# Patient Record
Sex: Female | Born: 1945 | Race: White | Hispanic: No | Marital: Single | Smoking: Former smoker
Health system: Southern US, Community
[De-identification: ages and names within clinical notes are randomized; demographics above are authoritative.]

## PROBLEM LIST (undated history)

## (undated) DIAGNOSIS — Z96653 Presence of artificial knee joint, bilateral: Secondary | ICD-10-CM

## (undated) DIAGNOSIS — I890 Lymphedema, not elsewhere classified: Secondary | ICD-10-CM

## (undated) DIAGNOSIS — I350 Nonrheumatic aortic (valve) stenosis: Secondary | ICD-10-CM

## (undated) DIAGNOSIS — C569 Malignant neoplasm of unspecified ovary: Secondary | ICD-10-CM

## (undated) DIAGNOSIS — Z972 Presence of dental prosthetic device (complete) (partial): Secondary | ICD-10-CM

## (undated) DIAGNOSIS — R918 Other nonspecific abnormal finding of lung field: Secondary | ICD-10-CM

## (undated) DIAGNOSIS — I480 Paroxysmal atrial fibrillation: Secondary | ICD-10-CM

## (undated) DIAGNOSIS — K08109 Complete loss of teeth, unspecified cause, unspecified class: Secondary | ICD-10-CM

## (undated) DIAGNOSIS — R32 Unspecified urinary incontinence: Secondary | ICD-10-CM

## (undated) DIAGNOSIS — Z9989 Dependence on other enabling machines and devices: Secondary | ICD-10-CM

## (undated) DIAGNOSIS — J45909 Unspecified asthma, uncomplicated: Secondary | ICD-10-CM

## (undated) DIAGNOSIS — E119 Type 2 diabetes mellitus without complications: Secondary | ICD-10-CM

## (undated) DIAGNOSIS — G629 Polyneuropathy, unspecified: Secondary | ICD-10-CM

## (undated) DIAGNOSIS — Z952 Presence of prosthetic heart valve: Secondary | ICD-10-CM

## (undated) DIAGNOSIS — Z8739 Personal history of other diseases of the musculoskeletal system and connective tissue: Secondary | ICD-10-CM

## (undated) DIAGNOSIS — Z96619 Presence of unspecified artificial shoulder joint: Secondary | ICD-10-CM

## (undated) DIAGNOSIS — I82409 Acute embolism and thrombosis of unspecified deep veins of unspecified lower extremity: Secondary | ICD-10-CM

## (undated) DIAGNOSIS — C50919 Malignant neoplasm of unspecified site of unspecified female breast: Secondary | ICD-10-CM

## (undated) DIAGNOSIS — G4733 Obstructive sleep apnea (adult) (pediatric): Secondary | ICD-10-CM

## (undated) DIAGNOSIS — K219 Gastro-esophageal reflux disease without esophagitis: Secondary | ICD-10-CM

## (undated) DIAGNOSIS — M109 Gout, unspecified: Secondary | ICD-10-CM

## (undated) DIAGNOSIS — I10 Essential (primary) hypertension: Secondary | ICD-10-CM

---

## 1991-03-11 HISTORY — PX: MASTECTOMY: SHX3

## 1997-08-11 ENCOUNTER — Ambulatory Visit (HOSPITAL_COMMUNITY): Admission: RE | Admit: 1997-08-11 | Discharge: 1997-08-11 | Payer: Self-pay | Admitting: Oncology

## 1997-12-24 ENCOUNTER — Emergency Department (HOSPITAL_COMMUNITY): Admission: EM | Admit: 1997-12-24 | Discharge: 1997-12-24 | Payer: Self-pay | Admitting: Emergency Medicine

## 1998-01-07 ENCOUNTER — Emergency Department (HOSPITAL_COMMUNITY): Admission: EM | Admit: 1998-01-07 | Discharge: 1998-01-07 | Payer: Self-pay | Admitting: Emergency Medicine

## 1998-01-08 ENCOUNTER — Ambulatory Visit (HOSPITAL_COMMUNITY): Admission: RE | Admit: 1998-01-08 | Discharge: 1998-01-08 | Payer: Self-pay

## 1998-01-11 ENCOUNTER — Ambulatory Visit (HOSPITAL_COMMUNITY): Admission: RE | Admit: 1998-01-11 | Discharge: 1998-01-11 | Payer: Self-pay | Admitting: Internal Medicine

## 1998-01-11 ENCOUNTER — Encounter: Payer: Self-pay | Admitting: Internal Medicine

## 1998-05-04 ENCOUNTER — Encounter: Payer: Self-pay | Admitting: Oncology

## 1998-05-04 ENCOUNTER — Ambulatory Visit (HOSPITAL_COMMUNITY): Admission: RE | Admit: 1998-05-04 | Discharge: 1998-05-04 | Payer: Self-pay | Admitting: Oncology

## 1998-09-25 ENCOUNTER — Ambulatory Visit (HOSPITAL_COMMUNITY): Admission: RE | Admit: 1998-09-25 | Discharge: 1998-09-25 | Payer: Self-pay | Admitting: Oncology

## 1998-09-25 ENCOUNTER — Encounter: Payer: Self-pay | Admitting: Oncology

## 1998-10-10 ENCOUNTER — Ambulatory Visit (HOSPITAL_COMMUNITY): Admission: RE | Admit: 1998-10-10 | Discharge: 1998-10-10 | Payer: Self-pay | Admitting: Oncology

## 1998-10-10 ENCOUNTER — Encounter: Payer: Self-pay | Admitting: Oncology

## 1998-10-16 ENCOUNTER — Encounter: Payer: Self-pay | Admitting: Oncology

## 1998-10-16 ENCOUNTER — Ambulatory Visit (HOSPITAL_COMMUNITY): Admission: RE | Admit: 1998-10-16 | Discharge: 1998-10-16 | Payer: Self-pay | Admitting: Oncology

## 1999-04-01 ENCOUNTER — Ambulatory Visit (HOSPITAL_COMMUNITY): Admission: RE | Admit: 1999-04-01 | Discharge: 1999-04-01 | Payer: Self-pay | Admitting: Oncology

## 1999-04-01 ENCOUNTER — Encounter: Payer: Self-pay | Admitting: Oncology

## 1999-05-07 ENCOUNTER — Encounter: Payer: Self-pay | Admitting: Oncology

## 1999-05-07 ENCOUNTER — Ambulatory Visit (HOSPITAL_COMMUNITY): Admission: RE | Admit: 1999-05-07 | Discharge: 1999-05-07 | Payer: Self-pay | Admitting: Oncology

## 2000-05-13 ENCOUNTER — Encounter: Payer: Self-pay | Admitting: Internal Medicine

## 2000-05-13 ENCOUNTER — Ambulatory Visit (HOSPITAL_COMMUNITY): Admission: RE | Admit: 2000-05-13 | Discharge: 2000-05-13 | Payer: Self-pay | Admitting: Internal Medicine

## 2000-08-12 ENCOUNTER — Emergency Department (HOSPITAL_COMMUNITY): Admission: EM | Admit: 2000-08-12 | Discharge: 2000-08-12 | Payer: Self-pay | Admitting: *Deleted

## 2001-03-18 ENCOUNTER — Encounter: Payer: Self-pay | Admitting: Emergency Medicine

## 2001-03-18 ENCOUNTER — Emergency Department (HOSPITAL_COMMUNITY): Admission: EM | Admit: 2001-03-18 | Discharge: 2001-03-18 | Payer: Self-pay | Admitting: Emergency Medicine

## 2001-05-26 ENCOUNTER — Ambulatory Visit (HOSPITAL_COMMUNITY): Admission: RE | Admit: 2001-05-26 | Discharge: 2001-05-26 | Payer: Self-pay | Admitting: Oncology

## 2001-05-26 ENCOUNTER — Encounter: Payer: Self-pay | Admitting: Oncology

## 2002-11-18 ENCOUNTER — Ambulatory Visit (HOSPITAL_COMMUNITY): Admission: RE | Admit: 2002-11-18 | Discharge: 2002-11-18 | Payer: Self-pay | Admitting: Internal Medicine

## 2002-11-18 ENCOUNTER — Encounter: Payer: Self-pay | Admitting: Internal Medicine

## 2003-04-27 ENCOUNTER — Emergency Department (HOSPITAL_COMMUNITY): Admission: EM | Admit: 2003-04-27 | Discharge: 2003-04-28 | Payer: Self-pay | Admitting: Emergency Medicine

## 2015-04-12 ENCOUNTER — Encounter (HOSPITAL_BASED_OUTPATIENT_CLINIC_OR_DEPARTMENT_OTHER): Payer: Medicare Other | Attending: Internal Medicine

## 2015-04-12 DIAGNOSIS — Z96619 Presence of unspecified artificial shoulder joint: Secondary | ICD-10-CM | POA: Diagnosis not present

## 2015-04-12 DIAGNOSIS — J45909 Unspecified asthma, uncomplicated: Secondary | ICD-10-CM | POA: Diagnosis not present

## 2015-04-12 DIAGNOSIS — S51802A Unspecified open wound of left forearm, initial encounter: Secondary | ICD-10-CM | POA: Insufficient documentation

## 2015-04-12 DIAGNOSIS — T451X5A Adverse effect of antineoplastic and immunosuppressive drugs, initial encounter: Secondary | ICD-10-CM | POA: Diagnosis not present

## 2015-04-12 DIAGNOSIS — Z87891 Personal history of nicotine dependence: Secondary | ICD-10-CM | POA: Diagnosis not present

## 2015-04-12 DIAGNOSIS — E119 Type 2 diabetes mellitus without complications: Secondary | ICD-10-CM | POA: Diagnosis not present

## 2015-04-12 DIAGNOSIS — I10 Essential (primary) hypertension: Secondary | ICD-10-CM | POA: Diagnosis not present

## 2015-04-12 DIAGNOSIS — G4730 Sleep apnea, unspecified: Secondary | ICD-10-CM | POA: Insufficient documentation

## 2015-04-12 DIAGNOSIS — Z9221 Personal history of antineoplastic chemotherapy: Secondary | ICD-10-CM | POA: Insufficient documentation

## 2015-04-12 DIAGNOSIS — Z96653 Presence of artificial knee joint, bilateral: Secondary | ICD-10-CM | POA: Diagnosis not present

## 2015-04-12 DIAGNOSIS — J449 Chronic obstructive pulmonary disease, unspecified: Secondary | ICD-10-CM | POA: Diagnosis not present

## 2015-04-12 DIAGNOSIS — I972 Postmastectomy lymphedema syndrome: Secondary | ICD-10-CM | POA: Insufficient documentation

## 2015-04-12 DIAGNOSIS — G620 Drug-induced polyneuropathy: Secondary | ICD-10-CM | POA: Diagnosis not present

## 2015-04-12 DIAGNOSIS — Y838 Other surgical procedures as the cause of abnormal reaction of the patient, or of later complication, without mention of misadventure at the time of the procedure: Secondary | ICD-10-CM | POA: Insufficient documentation

## 2015-04-20 DIAGNOSIS — S51802A Unspecified open wound of left forearm, initial encounter: Secondary | ICD-10-CM | POA: Diagnosis not present

## 2015-04-27 DIAGNOSIS — S51802A Unspecified open wound of left forearm, initial encounter: Secondary | ICD-10-CM | POA: Diagnosis not present

## 2018-06-22 ENCOUNTER — Encounter (HOSPITAL_COMMUNITY): Payer: Self-pay | Admitting: Physician Assistant

## 2018-06-22 ENCOUNTER — Emergency Department (HOSPITAL_COMMUNITY): Payer: Medicare Other

## 2018-06-22 ENCOUNTER — Inpatient Hospital Stay (HOSPITAL_COMMUNITY)
Admission: EM | Admit: 2018-06-22 | Discharge: 2018-06-30 | DRG: 266 | Disposition: A | Discharge status: Home / Self Care | Payer: Medicare Other | Attending: Cardiovascular Disease | Admitting: Cardiovascular Disease

## 2018-06-22 ENCOUNTER — Other Ambulatory Visit: Payer: Self-pay

## 2018-06-22 ENCOUNTER — Inpatient Hospital Stay (HOSPITAL_COMMUNITY): Payer: Medicare Other

## 2018-06-22 DIAGNOSIS — Z9981 Dependence on supplemental oxygen: Secondary | ICD-10-CM | POA: Diagnosis not present

## 2018-06-22 DIAGNOSIS — I5041 Acute combined systolic (congestive) and diastolic (congestive) heart failure: Secondary | ICD-10-CM | POA: Diagnosis not present

## 2018-06-22 DIAGNOSIS — Z9989 Dependence on other enabling machines and devices: Secondary | ICD-10-CM | POA: Diagnosis not present

## 2018-06-22 DIAGNOSIS — I10 Essential (primary) hypertension: Secondary | ICD-10-CM | POA: Diagnosis present

## 2018-06-22 DIAGNOSIS — E119 Type 2 diabetes mellitus without complications: Secondary | ICD-10-CM | POA: Diagnosis not present

## 2018-06-22 DIAGNOSIS — I272 Pulmonary hypertension, unspecified: Secondary | ICD-10-CM | POA: Diagnosis present

## 2018-06-22 DIAGNOSIS — R0602 Shortness of breath: Secondary | ICD-10-CM

## 2018-06-22 DIAGNOSIS — I080 Rheumatic disorders of both mitral and aortic valves: Principal | ICD-10-CM | POA: Diagnosis present

## 2018-06-22 DIAGNOSIS — Z90710 Acquired absence of both cervix and uterus: Secondary | ICD-10-CM

## 2018-06-22 DIAGNOSIS — E785 Hyperlipidemia, unspecified: Secondary | ICD-10-CM | POA: Diagnosis present

## 2018-06-22 DIAGNOSIS — A419 Sepsis, unspecified organism: Secondary | ICD-10-CM | POA: Diagnosis not present

## 2018-06-22 DIAGNOSIS — I5043 Acute on chronic combined systolic (congestive) and diastolic (congestive) heart failure: Secondary | ICD-10-CM | POA: Diagnosis present

## 2018-06-22 DIAGNOSIS — D729 Disorder of white blood cells, unspecified: Secondary | ICD-10-CM

## 2018-06-22 DIAGNOSIS — I890 Lymphedema, not elsewhere classified: Secondary | ICD-10-CM

## 2018-06-22 DIAGNOSIS — Z6841 Body Mass Index (BMI) 40.0 and over, adult: Secondary | ICD-10-CM

## 2018-06-22 DIAGNOSIS — R9389 Abnormal findings on diagnostic imaging of other specified body structures: Secondary | ICD-10-CM | POA: Diagnosis present

## 2018-06-22 DIAGNOSIS — E1142 Type 2 diabetes mellitus with diabetic polyneuropathy: Secondary | ICD-10-CM | POA: Diagnosis present

## 2018-06-22 DIAGNOSIS — J9622 Acute and chronic respiratory failure with hypercapnia: Secondary | ICD-10-CM | POA: Diagnosis present

## 2018-06-22 DIAGNOSIS — J9601 Acute respiratory failure with hypoxia: Secondary | ICD-10-CM | POA: Diagnosis not present

## 2018-06-22 DIAGNOSIS — Z96653 Presence of artificial knee joint, bilateral: Secondary | ICD-10-CM | POA: Diagnosis present

## 2018-06-22 DIAGNOSIS — I2721 Secondary pulmonary arterial hypertension: Secondary | ICD-10-CM | POA: Diagnosis present

## 2018-06-22 DIAGNOSIS — E1110 Type 2 diabetes mellitus with ketoacidosis without coma: Secondary | ICD-10-CM | POA: Diagnosis present

## 2018-06-22 DIAGNOSIS — I110 Hypertensive heart disease with heart failure: Secondary | ICD-10-CM | POA: Diagnosis present

## 2018-06-22 DIAGNOSIS — R32 Unspecified urinary incontinence: Secondary | ICD-10-CM | POA: Diagnosis present

## 2018-06-22 DIAGNOSIS — I350 Nonrheumatic aortic (valve) stenosis: Secondary | ICD-10-CM

## 2018-06-22 DIAGNOSIS — I2489 Other forms of acute ischemic heart disease: Secondary | ICD-10-CM | POA: Diagnosis present

## 2018-06-22 DIAGNOSIS — Z87891 Personal history of nicotine dependence: Secondary | ICD-10-CM

## 2018-06-22 DIAGNOSIS — D72828 Other elevated white blood cell count: Secondary | ICD-10-CM

## 2018-06-22 DIAGNOSIS — Z887 Allergy status to serum and vaccine status: Secondary | ICD-10-CM

## 2018-06-22 DIAGNOSIS — J9602 Acute respiratory failure with hypercapnia: Secondary | ICD-10-CM | POA: Diagnosis present

## 2018-06-22 DIAGNOSIS — R079 Chest pain, unspecified: Secondary | ICD-10-CM | POA: Diagnosis not present

## 2018-06-22 DIAGNOSIS — E876 Hypokalemia: Secondary | ICD-10-CM | POA: Diagnosis present

## 2018-06-22 DIAGNOSIS — J9621 Acute and chronic respiratory failure with hypoxia: Secondary | ICD-10-CM | POA: Diagnosis present

## 2018-06-22 DIAGNOSIS — I471 Supraventricular tachycardia: Secondary | ICD-10-CM | POA: Diagnosis not present

## 2018-06-22 DIAGNOSIS — Z8249 Family history of ischemic heart disease and other diseases of the circulatory system: Secondary | ICD-10-CM

## 2018-06-22 DIAGNOSIS — Z952 Presence of prosthetic heart valve: Secondary | ICD-10-CM | POA: Diagnosis not present

## 2018-06-22 DIAGNOSIS — J9600 Acute respiratory failure, unspecified whether with hypoxia or hypercapnia: Secondary | ICD-10-CM

## 2018-06-22 DIAGNOSIS — R69 Illness, unspecified: Secondary | ICD-10-CM

## 2018-06-22 DIAGNOSIS — Z006 Encounter for examination for normal comparison and control in clinical research program: Secondary | ICD-10-CM | POA: Diagnosis not present

## 2018-06-22 DIAGNOSIS — I480 Paroxysmal atrial fibrillation: Secondary | ICD-10-CM | POA: Diagnosis present

## 2018-06-22 DIAGNOSIS — I248 Other forms of acute ischemic heart disease: Secondary | ICD-10-CM | POA: Diagnosis present

## 2018-06-22 DIAGNOSIS — I351 Nonrheumatic aortic (valve) insufficiency: Secondary | ICD-10-CM | POA: Diagnosis not present

## 2018-06-22 DIAGNOSIS — I050 Rheumatic mitral stenosis: Secondary | ICD-10-CM | POA: Diagnosis not present

## 2018-06-22 DIAGNOSIS — E874 Mixed disorder of acid-base balance: Secondary | ICD-10-CM | POA: Diagnosis present

## 2018-06-22 DIAGNOSIS — J810 Acute pulmonary edema: Secondary | ICD-10-CM

## 2018-06-22 DIAGNOSIS — I5021 Acute systolic (congestive) heart failure: Secondary | ICD-10-CM | POA: Diagnosis not present

## 2018-06-22 DIAGNOSIS — Z8543 Personal history of malignant neoplasm of ovary: Secondary | ICD-10-CM

## 2018-06-22 DIAGNOSIS — R7989 Other specified abnormal findings of blood chemistry: Secondary | ICD-10-CM | POA: Diagnosis present

## 2018-06-22 DIAGNOSIS — I447 Left bundle-branch block, unspecified: Secondary | ICD-10-CM | POA: Diagnosis present

## 2018-06-22 DIAGNOSIS — Z881 Allergy status to other antibiotic agents status: Secondary | ICD-10-CM

## 2018-06-22 DIAGNOSIS — Z9119 Patient's noncompliance with other medical treatment and regimen: Secondary | ICD-10-CM

## 2018-06-22 DIAGNOSIS — J45909 Unspecified asthma, uncomplicated: Secondary | ICD-10-CM | POA: Diagnosis present

## 2018-06-22 DIAGNOSIS — G4733 Obstructive sleep apnea (adult) (pediatric): Secondary | ICD-10-CM

## 2018-06-22 DIAGNOSIS — Z9221 Personal history of antineoplastic chemotherapy: Secondary | ICD-10-CM

## 2018-06-22 DIAGNOSIS — R918 Other nonspecific abnormal finding of lung field: Secondary | ICD-10-CM | POA: Diagnosis present

## 2018-06-22 DIAGNOSIS — M109 Gout, unspecified: Secondary | ICD-10-CM | POA: Diagnosis present

## 2018-06-22 DIAGNOSIS — Z9012 Acquired absence of left breast and nipple: Secondary | ICD-10-CM

## 2018-06-22 DIAGNOSIS — I4891 Unspecified atrial fibrillation: Secondary | ICD-10-CM | POA: Diagnosis not present

## 2018-06-22 DIAGNOSIS — Z96612 Presence of left artificial shoulder joint: Secondary | ICD-10-CM | POA: Diagnosis present

## 2018-06-22 DIAGNOSIS — J189 Pneumonia, unspecified organism: Secondary | ICD-10-CM | POA: Diagnosis present

## 2018-06-22 DIAGNOSIS — I361 Nonrheumatic tricuspid (valve) insufficiency: Secondary | ICD-10-CM | POA: Diagnosis not present

## 2018-06-22 DIAGNOSIS — Z20828 Contact with and (suspected) exposure to other viral communicable diseases: Secondary | ICD-10-CM | POA: Diagnosis present

## 2018-06-22 DIAGNOSIS — I5033 Acute on chronic diastolic (congestive) heart failure: Secondary | ICD-10-CM | POA: Diagnosis present

## 2018-06-22 DIAGNOSIS — Z853 Personal history of malignant neoplasm of breast: Secondary | ICD-10-CM

## 2018-06-22 DIAGNOSIS — G629 Polyneuropathy, unspecified: Secondary | ICD-10-CM | POA: Diagnosis not present

## 2018-06-22 DIAGNOSIS — Z7901 Long term (current) use of anticoagulants: Secondary | ICD-10-CM

## 2018-06-22 DIAGNOSIS — E1165 Type 2 diabetes mellitus with hyperglycemia: Secondary | ICD-10-CM | POA: Diagnosis not present

## 2018-06-22 DIAGNOSIS — E118 Type 2 diabetes mellitus with unspecified complications: Secondary | ICD-10-CM | POA: Diagnosis not present

## 2018-06-22 DIAGNOSIS — J811 Chronic pulmonary edema: Secondary | ICD-10-CM | POA: Diagnosis present

## 2018-06-22 DIAGNOSIS — K219 Gastro-esophageal reflux disease without esophagitis: Secondary | ICD-10-CM | POA: Diagnosis present

## 2018-06-22 DIAGNOSIS — J440 Chronic obstructive pulmonary disease with acute lower respiratory infection: Secondary | ICD-10-CM | POA: Diagnosis present

## 2018-06-22 DIAGNOSIS — E1159 Type 2 diabetes mellitus with other circulatory complications: Secondary | ICD-10-CM | POA: Diagnosis not present

## 2018-06-22 HISTORY — DX: Malignant neoplasm of unspecified ovary: C56.9

## 2018-06-22 HISTORY — DX: Lymphedema, not elsewhere classified: I89.0

## 2018-06-22 HISTORY — DX: Acute embolism and thrombosis of unspecified deep veins of unspecified lower extremity: I82.409

## 2018-06-22 HISTORY — DX: Nonrheumatic aortic (valve) stenosis: I35.0

## 2018-06-22 HISTORY — DX: Complete loss of teeth, unspecified cause, unspecified class: K08.109

## 2018-06-22 HISTORY — DX: Morbid (severe) obesity due to excess calories: E66.01

## 2018-06-22 HISTORY — DX: Polyneuropathy, unspecified: G62.9

## 2018-06-22 HISTORY — DX: Obstructive sleep apnea (adult) (pediatric): Z99.89

## 2018-06-22 HISTORY — DX: Essential (primary) hypertension: I10

## 2018-06-22 HISTORY — DX: Gout, unspecified: M10.9

## 2018-06-22 HISTORY — DX: Presence of prosthetic heart valve: Z95.2

## 2018-06-22 HISTORY — DX: Gastro-esophageal reflux disease without esophagitis: K21.9

## 2018-06-22 HISTORY — DX: Unspecified asthma, uncomplicated: J45.909

## 2018-06-22 HISTORY — DX: Presence of dental prosthetic device (complete) (partial): Z97.2

## 2018-06-22 HISTORY — DX: Unspecified urinary incontinence: R32

## 2018-06-22 HISTORY — DX: Presence of artificial knee joint, bilateral: Z96.653

## 2018-06-22 HISTORY — DX: Paroxysmal atrial fibrillation: I48.0

## 2018-06-22 HISTORY — DX: Presence of unspecified artificial shoulder joint: Z96.619

## 2018-06-22 HISTORY — DX: Personal history of other diseases of the musculoskeletal system and connective tissue: Z87.39

## 2018-06-22 HISTORY — DX: Other nonspecific abnormal finding of lung field: R91.8

## 2018-06-22 HISTORY — DX: Obstructive sleep apnea (adult) (pediatric): G47.33

## 2018-06-22 HISTORY — DX: Type 2 diabetes mellitus without complications: E11.9

## 2018-06-22 HISTORY — DX: Malignant neoplasm of unspecified site of unspecified female breast: C50.919

## 2018-06-22 LAB — LIPASE, BLOOD: Lipase: 23 U/L (ref 11–51)

## 2018-06-22 LAB — URINALYSIS, ROUTINE W REFLEX MICROSCOPIC
Bacteria, UA: NONE SEEN
Bilirubin Urine: NEGATIVE
Glucose, UA: >=500 mg/dL — AB
Hgb urine dipstick: NEGATIVE
Ketones, ur: NEGATIVE mg/dL
Leukocytes,Ua: NEGATIVE
Nitrite: NEGATIVE
Protein, ur: 100 mg/dL — AB
Specific Gravity, Urine: 1.021 (ref 1.005–1.030)
pH: 5 (ref 5.0–8.0)

## 2018-06-22 LAB — COMPREHENSIVE METABOLIC PANEL
ALT: 34 U/L (ref 0–44)
AST: 48 U/L — ABNORMAL HIGH (ref 15–41)
Albumin: 4.2 g/dL (ref 3.5–5.0)
Alkaline Phosphatase: 69 U/L (ref 38–126)
Anion gap: 16 — ABNORMAL HIGH (ref 5–15)
BUN: 22 mg/dL (ref 8–23)
CO2: 18 mmol/L — ABNORMAL LOW (ref 22–32)
Calcium: 8.8 mg/dL — ABNORMAL LOW (ref 8.9–10.3)
Chloride: 101 mmol/L (ref 98–111)
Creatinine, Ser: 1 mg/dL (ref 0.44–1.00)
GFR calc Af Amer: >60 mL/min (ref >60)
GFR calc non Af Amer: 55 mL/min — ABNORMAL LOW (ref >60)
Glucose, Bld: 429 mg/dL — ABNORMAL HIGH (ref 70–99)
Potassium: 5.1 mmol/L (ref 3.5–5.1)
Sodium: 135 mmol/L (ref 135–145)
Total Bilirubin: 1.2 mg/dL (ref 0.3–1.2)
Total Protein: 7.4 g/dL (ref 6.5–8.1)

## 2018-06-22 LAB — POCT I-STAT EG7
Acid-base deficit: 5 mmol/L — ABNORMAL HIGH (ref 0.0–2.0)
Bicarbonate: 22.7 mmol/L (ref 20.0–28.0)
Calcium, Ion: 1.04 mmol/L — ABNORMAL LOW (ref 1.15–1.40)
HCT: 42 % (ref 36.0–46.0)
Hemoglobin: 14.3 g/dL (ref 12.0–15.0)
O2 Saturation: 98 %
Potassium: 5 mmol/L (ref 3.5–5.1)
Sodium: 134 mmol/L — ABNORMAL LOW (ref 135–145)
TCO2: 24 mmol/L (ref 22–32)
pCO2, Ven: 50.1 mmHg (ref 44.0–60.0)
pH, Ven: 7.264 (ref 7.250–7.430)
pO2, Ven: 119 mmHg — ABNORMAL HIGH (ref 32.0–45.0)

## 2018-06-22 LAB — POCT I-STAT 7, (LYTES, BLD GAS, ICA,H+H)
Acid-base deficit: 2 mmol/L (ref 0.0–2.0)
Bicarbonate: 27 mmol/L (ref 20.0–28.0)
Calcium, Ion: 1.22 mmol/L (ref 1.15–1.40)
HCT: 35 % — ABNORMAL LOW (ref 36.0–46.0)
Hemoglobin: 11.9 g/dL — ABNORMAL LOW (ref 12.0–15.0)
O2 Saturation: 99 %
Patient temperature: 97.1
Potassium: 5 mmol/L (ref 3.5–5.1)
Sodium: 136 mmol/L (ref 135–145)
TCO2: 29 mmol/L (ref 22–32)
pCO2 arterial: 62.5 mmHg — ABNORMAL HIGH (ref 32.0–48.0)
pH, Arterial: 7.239 — ABNORMAL LOW (ref 7.350–7.450)
pO2, Arterial: 152 mmHg — ABNORMAL HIGH (ref 83.0–108.0)

## 2018-06-22 LAB — CBG MONITORING, ED
Glucose-Capillary: 347 mg/dL — ABNORMAL HIGH (ref 70–99)
Glucose-Capillary: 320 mg/dL — ABNORMAL HIGH (ref 70–99)

## 2018-06-22 LAB — CBC WITH DIFFERENTIAL/PLATELET
Abs Immature Granulocytes: 0.61 10*3/uL — ABNORMAL HIGH (ref 0.00–0.07)
Basophils Absolute: 0.1 10*3/uL (ref 0.0–0.1)
Basophils Relative: 1 %
Eosinophils Absolute: 0.1 10*3/uL (ref 0.0–0.5)
Eosinophils Relative: 0 %
HCT: 44.4 % (ref 36.0–46.0)
Hemoglobin: 13.7 g/dL (ref 12.0–15.0)
Immature Granulocytes: 3 %
Lymphocytes Relative: 20 %
Lymphs Abs: 3.7 10*3/uL (ref 0.7–4.0)
MCH: 30.4 pg (ref 26.0–34.0)
MCHC: 30.9 g/dL (ref 30.0–36.0)
MCV: 98.4 fL (ref 80.0–100.0)
Monocytes Absolute: 1.3 10*3/uL — ABNORMAL HIGH (ref 0.1–1.0)
Monocytes Relative: 7 %
Neutro Abs: 12.2 10*3/uL — ABNORMAL HIGH (ref 1.7–7.7)
Neutrophils Relative %: 69 %
Platelets: 465 10*3/uL — ABNORMAL HIGH (ref 150–400)
RBC: 4.51 MIL/uL (ref 3.87–5.11)
RDW: 14.2 % (ref 11.5–15.5)
WBC: 18 10*3/uL — ABNORMAL HIGH (ref 4.0–10.5)
nRBC: 0.2 % (ref 0.0–0.2)

## 2018-06-22 LAB — I-STAT CREATININE, ED: Creatinine, Ser: 0.8 mg/dL (ref 0.44–1.00)

## 2018-06-22 LAB — BRAIN NATRIURETIC PEPTIDE: B Natriuretic Peptide: 447.3 pg/mL — ABNORMAL HIGH (ref 0.0–100.0)

## 2018-06-22 LAB — D-DIMER, QUANTITATIVE: D-Dimer, Quant: 0.87 ug/mL-FEU — ABNORMAL HIGH (ref 0.00–0.50)

## 2018-06-22 LAB — I-STAT TROPONIN, ED: Troponin i, poc: 0.02 ng/mL (ref 0.00–0.08)

## 2018-06-22 LAB — GLUCOSE, CAPILLARY
Glucose-Capillary: 258 mg/dL — ABNORMAL HIGH (ref 70–99)
Glucose-Capillary: 264 mg/dL — ABNORMAL HIGH (ref 70–99)
Glucose-Capillary: 281 mg/dL — ABNORMAL HIGH (ref 70–99)

## 2018-06-22 LAB — PHOSPHORUS: Phosphorus: 7.6 mg/dL — ABNORMAL HIGH (ref 2.5–4.6)

## 2018-06-22 LAB — PROTIME-INR
INR: 1.2 (ref 0.8–1.2)
Prothrombin Time: 15.2 seconds (ref 11.4–15.2)

## 2018-06-22 LAB — MAGNESIUM: Magnesium: 1.7 mg/dL (ref 1.7–2.4)

## 2018-06-22 LAB — LACTATE DEHYDROGENASE: LDH: 241 U/L — ABNORMAL HIGH (ref 98–192)

## 2018-06-22 MED ORDER — SODIUM CHLORIDE 0.9 % IV SOLN
500.0000 mg | INTRAVENOUS | Status: DC
  Administered 2018-06-22 – 2018-06-23 (×2): 500 mg via INTRAVENOUS
  Filled 2018-06-22 (×5): qty 500

## 2018-06-22 MED ORDER — SODIUM CHLORIDE 0.9 % IV SOLN
INTRAVENOUS | Status: DC
  Administered 2018-06-23 – 2018-06-25 (×3): via INTRAVENOUS

## 2018-06-22 MED ORDER — NOREPINEPHRINE 4 MG/250ML-% IV SOLN
0.0000 ug/min | INTRAVENOUS | Status: DC
  Administered 2018-06-23: 05:00:00 3 ug/min via INTRAVENOUS
  Administered 2018-06-23: 2 ug/min via INTRAVENOUS
  Filled 2018-06-22: qty 250

## 2018-06-22 MED ORDER — ENOXAPARIN SODIUM 40 MG/0.4ML ~~LOC~~ SOLN
40.0000 mg | SUBCUTANEOUS | Status: DC
  Administered 2018-06-23 – 2018-06-24 (×2): 40 mg via SUBCUTANEOUS
  Filled 2018-06-22 (×3): qty 0.4

## 2018-06-22 MED ORDER — MIDAZOLAM 50MG/50ML (1MG/ML) PREMIX INFUSION
0.5000 mg/h | INTRAVENOUS | Status: DC
  Administered 2018-06-22: 0.5 mg/h via INTRAVENOUS
  Filled 2018-06-22: qty 50

## 2018-06-22 MED ORDER — EPINEPHRINE PF 1 MG/ML IJ SOLN
0.5000 ug/min | INTRAVENOUS | Status: DC
  Filled 2018-06-22: qty 4

## 2018-06-22 MED ORDER — FENTANYL CITRATE (PF) 100 MCG/2ML IJ SOLN: 25.0000 ug | INTRAMUSCULAR | Status: DC | PRN

## 2018-06-22 MED ORDER — DEXTROSE 50 % IV SOLN: 25.0000 mL | INTRAVENOUS | Status: DC | PRN

## 2018-06-22 MED ORDER — MIDAZOLAM HCL 2 MG/2ML IJ SOLN: 1.0000 mg | INTRAMUSCULAR | Status: DC | PRN

## 2018-06-22 MED ORDER — INSULIN REGULAR(HUMAN) IN NACL 100-0.9 UT/100ML-% IV SOLN
INTRAVENOUS | Status: AC
  Administered 2018-06-23: 2 [IU]/h via INTRAVENOUS
  Filled 2018-06-22 (×2): qty 100

## 2018-06-22 MED ORDER — PANTOPRAZOLE SODIUM 40 MG IV SOLR
40.0000 mg | Freq: Every day | INTRAVENOUS | Status: DC
  Administered 2018-06-23: 40 mg via INTRAVENOUS
  Filled 2018-06-22: qty 40

## 2018-06-22 MED ORDER — INSULIN REGULAR(HUMAN) IN NACL 100-0.9 UT/100ML-% IV SOLN
INTRAVENOUS | Status: DC
  Administered 2018-06-22: 2.6 [IU]/h via INTRAVENOUS
  Filled 2018-06-22: qty 100

## 2018-06-22 MED ORDER — FUROSEMIDE 10 MG/ML IJ SOLN
40.0000 mg | Freq: Once | INTRAMUSCULAR | Status: AC
  Administered 2018-06-23: 40 mg via INTRAVENOUS
  Filled 2018-06-22: qty 4

## 2018-06-22 MED ORDER — ROCURONIUM BROMIDE 50 MG/5ML IV SOLN
INTRAVENOUS | Status: AC | PRN
  Administered 2018-06-22: 100 mg via INTRAVENOUS

## 2018-06-22 MED ORDER — SODIUM CHLORIDE 0.9 % IV SOLN
INTRAVENOUS | Status: DC
  Administered 2018-06-22: 22:00:00 via INTRAVENOUS

## 2018-06-22 MED ORDER — DEXTROSE-NACL 5-0.45 % IV SOLN
INTRAVENOUS | Status: AC
  Administered 2018-06-23 (×2): via INTRAVENOUS

## 2018-06-22 MED ORDER — SODIUM CHLORIDE 0.9 % IV SOLN
2.0000 g | INTRAVENOUS | Status: DC
  Administered 2018-06-22 – 2018-06-23 (×2): 2 g via INTRAVENOUS
  Filled 2018-06-22 (×4): qty 20

## 2018-06-22 MED ORDER — DEXTROSE-NACL 5-0.45 % IV SOLN: INTRAVENOUS | Status: DC

## 2018-06-22 MED ORDER — INSULIN REGULAR BOLUS VIA INFUSION
0.0000 [IU] | Freq: Three times a day (TID) | INTRAVENOUS | Status: DC
  Filled 2018-06-22: qty 10

## 2018-06-22 MED ORDER — MORPHINE SULFATE (PF) 4 MG/ML IV SOLN
INTRAVENOUS | Status: AC
  Filled 2018-06-22: qty 1

## 2018-06-22 MED ORDER — ALBUTEROL SULFATE HFA 108 (90 BASE) MCG/ACT IN AERS
1.0000 | INHALATION_SPRAY | RESPIRATORY_TRACT | Status: DC | PRN
  Filled 2018-06-22: qty 6.7

## 2018-06-22 MED ORDER — HYDRALAZINE HCL 20 MG/ML IJ SOLN: 10.0000 mg | INTRAMUSCULAR | Status: DC | PRN

## 2018-06-22 MED ORDER — ETOMIDATE 2 MG/ML IV SOLN
INTRAVENOUS | Status: AC | PRN
  Administered 2018-06-22: 20 mg via INTRAVENOUS

## 2018-06-22 NOTE — ED Notes (Signed)
[phone] Tanya [name] daughter and POA

## 2018-06-22 NOTE — Progress Notes (Signed)
The chaplain responded by phone to ED for [employer].  This chaplain understands with the assistance from [place] the [employer] stated no needs at this time.  This chaplain is available for F/U spiritual care as needed.

## 2018-06-22 NOTE — Progress Notes (Signed)
Point-of-care ultrasound performed with portable ultrasound machine.  Left ventricular systolic function is grossly normal with an ejection fraction of approximately 50%.  There is mild septal dyssynchrony in the apical four-chamber view, but the EF appears frankly normal and there are no regional wall motion abnormalities in the apical 2 chamber view.    Right ventricular size and systolic function appears normal.  No pericardial effusion.  There is heavy calcification and restricted motion in the aortic valve, which appears grossly tricuspid.  There is at least mild aortic insufficiency.  Unable to quantify degree of aortic stenosis due to the absence of spectral Doppler capabilities.  There is heavy mitral annular calcification.  Again, unable to evaluate if there is mitral stenosis.  Recommend complete formal echo with spectral Doppler, after exclusion of COVID-19 infection.  Sanda Klein, MD, [facility] [phone] office [phone] pager

## 2018-06-22 NOTE — H&P (Signed)
NAME:  Tanya [name], MRN:  [id number], DOB:  02-24-1946, LOS: 0 ADMISSION DATE:  06/22/2018, CONSULTATION DATE:  4/14 REFERRING MD:  Johnney Killian - EM  CHIEF COMPLAINT:  Chest pain,  respiratory distress   Brief History   73 year old F who presented to ED for 10/10 chest pain. Intubated in ED   History of present illness   73 yo F PMH HTN, DM presented to [facility] ED with 10/10 chest pain, beginning several days prior. Patient exhibited respiratory distress with tachypnea, and associated tachycardia to 140s, and was intubated in the ED. Prior to being intubated, there was no known history of nausea, vomiting, URI sx, but patient did present with some diaphoresis. Family has not been able to provide history over the phone. In ED, patient started on insulin gtt for glucose >400. Patient started on azithromycin and ceftriaxone for WBC 18 with CXR revealing bilateral opacities. Cardiology consulted on patient in ED given presenting chest pain. Bedside echo revealed LVEF approximately 50%. ECG reveals LBBB.  Initial labs reveal WBC 18 without lymphopenia. LDH 241. D-Dimer  0.87. BNP 447. Glucose 429. CXR reveals bilat opacities. Patient will be tested for COVID-19 and is a COVID rule-out.    Past Medical History  DM HTN  Significant Hospital Events   4/14 > admitted.   Consults:  Cardiology  Procedures:  4/14 ETT>   Significant Diagnostic Tests:  CXR 4/14> cardiomegaly. Bilateral ASD, R>L. Small bilateral pleural effusions. Personally reviewed.   Micro Data:  BCx 4/14> RVP 4/14> Tracheal Aspirate 4/14> COVID-19 4/14>   Antimicrobials:  Azithromycin 4/14>> Cefepime 4/14>>  Interim history/subjective:  Intubated in ED.   Objective   Blood pressure (!) 112/54, pulse 61, temperature (!) 97.1 F (36.2 C), temperature source Rectal, resp. rate 19, height 5\' 2"  (1.575 m), weight 100 kg, SpO2 100 %.    Vent Mode: PRVC FiO2 (%):  [100 %] 100 % Set Rate:  [16 bmp] 16 bmp Vt Set:  [400 mL]  400 mL PEEP:  [5 cmH20] 5 cmH20 Plateau Pressure:  [29 cmH20] 29 cmH20  No intake or output data in the 24 hours ending 06/22/18 2129 Filed Weights   06/22/18 1933  Weight: 100 kg    Examination: General: obese adult female, intubated, supine on stretcher HENT: NCAT, ETT secure, trachea midline, pink mmm Lungs: Insp. Wheeze, bilateral scattered rhonchi. No increased WOB  Cardiovascular: RRR s1s2 no JVD  Abdomen: round, obese, ndnt, bowel sounds x4  Extremities: symmetrical bulk and tone, no obvious deformity, cap refill < 3 sec BUE BLE Neuro: Lightly sedated. PERRL. Following commands. Skin: Clean, dry, intact without rash   Resolved Hospital Problem list     Assessment & Plan:   Acute respiratory failure -tachypnea with hypoxia reported in ED -CXR with bilateral opacities, edema vs ?PNA -COVID-19 rule out  P Continue mechanical vent support per ARDSnet protocol SpO2 goal >92%, titrate PEEP/FiO2 for goal  Pulm hygiene Follow up tracheal aspirate, RVP, COVID-19 result Checking PCT, LDH, ferritin, CRP, sed rate, IL-6  Airborne + contact precautions  Continue Azithromycin + Ceftriaxone  Lasix 40 x1  Sedation:PRN fent PRN versed  PRN albuterol  WUA/SBT  In AM   Septic shock P Follow up BCx, Tracheal aspirate, UA Abx as above MAP goal > 65 Titrate pressors to goal    DM with acute hyperglycemia P Continue insulin gtt at this time Will need transition off of gtt to SSI +basal when appropriate  Check Beta hydroxybuteric acid  Chest Pain Bedside echo by cards reveals LVEF estimated 50%  ECG 4/14: Sinus tachycardia with LBBB  CXR reveals cardiomegaly, ?pulm edema BNP >400 Trop 0.02 P Cardiology following appreciate recs Formal ECHO to be done when COVID-19 results  Continue to trend trops (initial was unremarkable) Continue tele  Lasix as above   HTN P Will need med rec to determine home meds PRN Hydral available if patient is able to come off pressors  and is hypertensive    Best practice:  Diet: NPO  Pain/Anxiety/Delirium protocol (if indicated): PRN fentanyl, PRN versed  VAP protocol (if indicated): yes DVT prophylaxis: lovenox  GI prophylaxis: protonix  Glucose control: insulin gtt  Mobility: bedrest Code Status: full  Family Communication: attempted Disposition: Admit to ICU   Labs   CBC: Recent Labs  Lab 06/22/18 1859 06/22/18 1905  WBC  --  18.0*  NEUTROABS  --  12.2*  HGB 14.3 13.7  HCT 42.0 44.4  MCV  --  98.4  PLT  --  465*    Basic Metabolic Panel: Recent Labs  Lab 06/22/18 1859 06/22/18 1905  NA 134* 135  K 5.0 5.1  CL  --  101  CO2  --  18*  GLUCOSE  --  429*  BUN  --  22  CREATININE 0.80 1.00  CALCIUM  --  8.8*  MG  --  1.7  PHOS  --  7.6*   GFR: Estimated Creatinine Clearance: 53.8 mL/min (by C-G formula based on SCr of 1 mg/dL). Recent Labs  Lab 06/22/18 1905  WBC 18.0*    Liver Function Tests: Recent Labs  Lab 06/22/18 1905  AST 48*  ALT 34  ALKPHOS 69  BILITOT 1.2  PROT 7.4  ALBUMIN 4.2   Recent Labs  Lab 06/22/18 1905  LIPASE 23   No results for input(s): AMMONIA in the last 168 hours.  ABG    Component Value Date/Time   HCO3 22.7 06/22/2018 1859   TCO2 24 06/22/2018 1859   ACIDBASEDEF 5.0 (H) 06/22/2018 1859   O2SAT 98.0 06/22/2018 1859     Coagulation Profile: Recent Labs  Lab 06/22/18 1935  INR 1.2    Cardiac Enzymes: No results for input(s): CKTOTAL, CKMB, CKMBINDEX, TROPONINI in the last 168 hours.  HbA1C: No results found for: HGBA1C  CBG: Recent Labs  Lab 06/22/18 1946  GLUCAP 347*    Review of Systems:   Unable to obtain, patient intubated and sedated   Past Medical History  She,  has a past medical history of Acute respiratory failure with hypoxia and hypercapnia ([place]) (06/22/2018), Diabetes ([place]), and HTN (hypertension).   Surgical History    Past Surgical History:  Procedure Laterality Date  . MASTECTOMY Left 1993     Social  History      Family History   Her family history is not on file.   Allergies Allergies  Allergen Reactions  . Levaquin [Levofloxacin]   . Pneumovax [Pneumococcal Polysaccharide Vaccine]      Home Medications  Prior to Admission medications   Not on File     Critical care time: 60 minutes     Eliseo Gum MSN, AGACNP-BC [place] [phone] If no answer, [phone] 06/22/2018, 10:13 PM

## 2018-06-22 NOTE — ED Provider Notes (Signed)
[facility] EMERGENCY DEPARTMENT Provider Note   CSN: [id number] Arrival date & time: 06/22/18  1836    History   Chief Complaint Chief Complaint  Patient presents with   Chest Pain    HPI Tanya [name] is a 73 y.o. female.     HPI Arrives in significant respiratory distress.  EMS reported oxygen saturations in the field were 40%.  She did respond to nonrebreather mask.  Patient also complained of chest pain developing in her upper chest.  This reports that she was diaphoretic and had gotten 1 nitroglycerin at home.  They also gave a nitroglycerin and aspirin as well as for morphine prior to arrival.  Patient was in significant respiratory distress and although mental status was clear, she was having difficulty communicating the duration and associated symptoms.  Much later I was able to contact the patient's daughter at [phone].  Patient daughter reports that she does spend a lot of time assisting her mother.  She was actually just helping her moving with her.  She reports she has been in close quarantine since January and she does not really suspect she has had a coronavirus exposure.  She reports she does have basically chronic respiratory failure.  She sees  cardiologist in [place], Dr. Zenia Resides and Dr. Beverlyn Roux.  She has aortic stenosis and mitral regurgitation.  Patient was just wearing a Holter monitor which was due to be returned in the next 2 days.  She has had ongoing respiratory dysfunction for almost 3 years.  She variably uses 3 to 4 L of home oxygen.  Her daughter is not sure of the exact cause of the respiratory failure, she reports that she had a fall 3 to 4 years ago and was sedated on morphine and fentanyl and ultimately had organ failure and ever since that time has had oxygen requirement.  Patient had a CT scan last week that showed groundglass appearance and some volume overload.  She got started on prednisone and was on day 4 as of today.   Reportedly she was also prescribed Lasix which she had not yet started.  Patient's daughter reports that she just kept getting increasingly short of breath today.  She tried giving her a nitroglycerin and increasing her oxygen up to 4 L.  Continue to get worse and by the time the medics got there her oxygen readings were in the 40s and she was very short of breath.  Patient should have medical records from [facility] hospitalization 3 years ago and [facility] records. Past Medical History:  Diagnosis Date   Acute respiratory failure with hypoxia and hypercapnia (HCC) 06/22/2018   Diabetes ([place])    HTN (hypertension)     Patient Active Problem List   Diagnosis Date Noted   Acute respiratory failure with hypoxia and hypercapnia ([place]) 06/22/2018   DKA (diabetic ketoacidoses) ([place]) 06/22/2018   Elevated brain natriuretic peptide (BNP) level 06/22/2018   Abnormal CXR 06/22/2018   Pulmonary edema 80/99/8338   Neutrophilic leukocytosis 25/07/3974   Acute respiratory failure ([place]) 06/22/2018       OB History   No obstetric history on file.      Home Medications    Prior to Admission medications   Not on File    Family History No family history on file.  Social History Social History   Tobacco Use   Smoking status: Not on file  Substance Use Topics   Alcohol use: Not on file   Drug use: Not on  file     Allergies   Levaquin [levofloxacin] and Pneumovax [pneumococcal polysaccharide vaccine]   Review of Systems Review of Systems Level 5 caveat cannot obtain review of systems due to patient extremitas.  Physical Exam Updated Vital Signs BP (!) 94/55    Pulse 62    Temp (!) 97.1 F (36.2 C) (Rectal)    Resp 20    Ht 5\' 2"  (1.575 m)    Wt 100 kg    SpO2 100%    BMI 40.32 kg/m   Physical Exam Constitutional:      Comments: Patient severe respiratory distress.  Alert.  Diaphoretic.  HENT:     Head: Normocephalic and atraumatic.     Mouth/Throat:     Mouth:  Mucous membranes are moist.     Pharynx: Oropharynx is clear.  Eyes:     Extraocular Movements: Extraocular movements intact.  Cardiovascular:     Comments: Cardiac rates to 130s.  Systolic ejection murmur 6-3+ Pulmonary:     Comments: Severe respiratory distress.  Crackles throughout lung fields. Abdominal:     General: There is no distension.     Palpations: Abdomen is soft.     Tenderness: There is no abdominal tenderness. There is no guarding.  Musculoskeletal: Normal range of motion.        General: No swelling or tenderness.     Comments: Peripheral edema.  Calves are soft and pliable.  Skin:    Comments: Diaphoretic and pale.  Neurological:     Comments: Patient is alert.  She is in extremitas and speech is somewhat difficult to understand for that reason but content is normal.  No evident focal motor deficits.  Psychiatric:     Comments: Very anxious.      ED Treatments / Results  Labs (all labs ordered are listed, but only abnormal results are displayed) Labs Reviewed  COMPREHENSIVE METABOLIC PANEL - Abnormal; Notable for the following components:      Result Value   CO2 18 (*)    Glucose, Bld 429 (*)    Calcium 8.8 (*)    AST 48 (*)    GFR calc non Af Amer 55 (*)    Anion gap 16 (*)    All other components within normal limits  BRAIN NATRIURETIC PEPTIDE - Abnormal; Notable for the following components:   B Natriuretic Peptide 447.3 (*)    All other components within normal limits  CBC WITH DIFFERENTIAL/PLATELET - Abnormal; Notable for the following components:   WBC 18.0 (*)    Platelets 465 (*)    Neutro Abs 12.2 (*)    Monocytes Absolute 1.3 (*)    Abs Immature Granulocytes 0.61 (*)    All other components within normal limits  URINALYSIS, ROUTINE W REFLEX MICROSCOPIC - Abnormal; Notable for the following components:   APPearance HAZY (*)    Glucose, UA >=500 (*)    Protein, ur 100 (*)    All other components within normal limits  LACTATE DEHYDROGENASE  - Abnormal; Notable for the following components:   LDH 241 (*)    All other components within normal limits  PHOSPHORUS - Abnormal; Notable for the following components:   Phosphorus 7.6 (*)    All other components within normal limits  D-DIMER, QUANTITATIVE (NOT AT [facility]) - Abnormal; Notable for the following components:   D-Dimer, Quant 0.87 (*)    All other components within normal limits  GLUCOSE, CAPILLARY - Abnormal; Notable for the following components:   Glucose-Capillary  281 (*)    All other components within normal limits  CBG MONITORING, ED - Abnormal; Notable for the following components:   Glucose-Capillary 347 (*)    All other components within normal limits  POCT I-STAT EG7 - Abnormal; Notable for the following components:   pO2, Ven 119.0 (*)    Acid-base deficit 5.0 (*)    Sodium 134 (*)    Calcium, Ion 1.04 (*)    All other components within normal limits  POCT I-STAT 7, (LYTES, BLD GAS, ICA,H+H) - Abnormal; Notable for the following components:   pH, Arterial 7.239 (*)    pCO2 arterial 62.5 (*)    pO2, Arterial 152.0 (*)    HCT 35.0 (*)    Hemoglobin 11.9 (*)    All other components within normal limits  CBG MONITORING, ED - Abnormal; Notable for the following components:   Glucose-Capillary 320 (*)    All other components within normal limits  CULTURE, BLOOD (ROUTINE X 2)  CULTURE, BLOOD (ROUTINE X 2)  RESPIRATORY PANEL BY PCR  CULTURE, RESPIRATORY  SARS CORONAVIRUS 2 (HOSPITAL ORDER, PERFORMED IN [facility])  MRSA PCR SCREENING  LIPASE, BLOOD  MAGNESIUM  PROTIME-INR  LACTIC ACID, PLASMA  LACTIC ACID, PLASMA  C-REACTIVE PROTEIN  TSH  BLOOD GAS, ARTERIAL  BLOOD GAS, ARTERIAL  CBC  CBC  BASIC METABOLIC PANEL  BLOOD GAS, ARTERIAL  MAGNESIUM  PHOSPHORUS  FERRITIN  INTERLEUKIN-6, PLASMA  SEDIMENTATION RATE  PROCALCITONIN  COOXEMETRY PANEL  BETA-HYDROXYBUTYRIC ACID  TROPONIN I  TROPONIN I  TROPONIN I  I-STAT TROPONIN, ED    I-STAT CREATININE, ED  ABO/RH    EKG EKG Interpretation  Date/Time:  Tuesday June 22 2018 18:44:30 EDT Ventricular Rate:  113 PR Interval:    QRS Duration: 161 QT Interval:  374 QTC Calculation: 513 R Axis:   80 Text Interpretation:  Sinus tachycardia Left bundle branch block Baseline wander in lead(s) I II aVR agree. new LBBB, Ischemic appearqnce Confirmed by Charlesetta Shanks [phone]) on 06/22/2018 7:04:07 PM   Radiology Dg Chest Portable 1 View  Result Date: 06/22/2018 CLINICAL DATA:  Check endotracheal tube placement EXAM: PORTABLE CHEST 1 VIEW COMPARISON:  06/22/2018 FINDINGS: Cardiac shadow is stable. Endotracheal tube and gastric catheter are noted in satisfactory position. Stable changes of vascular congestion and asymmetric edema are again noted. No new focal abnormality is seen. Left shoulder replacement is noted. IMPRESSION: Increased vascular congestion and asymmetric edema right greater than left. Tubes and lines as described. Electronically Signed   By: Inez Catalina M.D.   On: 06/22/2018 20:16   Dg Chest Port 1 View  Result Date: 06/22/2018 CLINICAL DATA:  Chest pain and shortness of breath. EXAM: PORTABLE CHEST 1 VIEW COMPARISON:  04/27/2003. FINDINGS: 1850 hours. The cardio pericardial silhouette is enlarged. Diffuse interstitial and bibasilar airspace disease evident, right greater than left. Tiny bilateral pleural effusions. Bones are diffusely demineralized. Patient is status post left shoulder replacement. Telemetry leads overlie the chest. IMPRESSION: Cardiomegaly with interstitial and asymmetric airspace disease, right greater than left. Tiny bilateral pleural effusions. Imaging features suggest asymmetric pulmonary edema although diffuse infection not excluded. Electronically Signed   By: Misty Stanley M.D.   On: 06/22/2018 19:24    Procedures Procedure Name: Intubation Date/Time: 06/22/2018 11:32 PM Performed by: Charlesetta Shanks, MD Pre-anesthesia Checklist:  Patient identified, Patient being monitored, Emergency Drugs available and Suction available Oxygen Delivery Method: Ambu bag Preoxygenation: Pre-oxygenation with 100% oxygen Induction Type: Rapid sequence Ventilation: Mask ventilation without  difficulty Laryngoscope Size: Glidescope and 3 Grade View: Grade III Tube size: 7.5 mm Number of attempts: 1 Placement Confirmation: ETT inserted through vocal cords under direct vision,  CO2 detector and Breath sounds checked- equal and bilateral Dental Injury: Teeth and Oropharynx as per pre-operative assessment  Comments: Uncomplicated intubation on first attempt.      (including critical care time) CRITICAL CARE Performed by: Charlesetta Shanks   Total critical care time: 45 minutes  Critical care time was exclusive of separately billable procedures and treating other patients.  Critical care was necessary to treat or prevent imminent or life-threatening deterioration.  Critical care was time spent personally by me on the following activities: development of treatment plan with patient and/or surrogate as well as nursing, discussions with consultants, evaluation of patient's response to treatment, examination of patient, obtaining history from patient or surrogate, ordering and performing treatments and interventions, ordering and review of laboratory studies, ordering and review of radiographic studies, pulse oximetry and re-evaluation of patient's condition.   Medications Ordered in ED Medications  morphine 4 MG/ML injection (has no administration in time range)  cefTRIAXone (ROCEPHIN) 2 g in sodium chloride 0.9 % 100 mL IVPB (2 g Intravenous New Bag/Given 06/22/18 2238)  azithromycin (ZITHROMAX) 500 mg in sodium chloride 0.9 % 250 mL IVPB (0 mg Intravenous Stopped 06/22/18 2231)  insulin regular bolus via infusion 0-10 Units (has no administration in time range)  insulin regular, human (MYXREDLIN) 100 units/ 100 mL infusion (4.1 Units/hr  Intravenous Rate/Dose Change 06/22/18 2237)  dextrose 50 % solution 25 mL (has no administration in time range)  0.9 %  sodium chloride infusion ( Intravenous New Bag/Given 06/22/18 2141)  dextrose 5 %-0.45 % sodium chloride infusion (has no administration in time range)  enoxaparin (LOVENOX) injection 40 mg (has no administration in time range)  pantoprazole (PROTONIX) injection 40 mg (has no administration in time range)  hydrALAZINE (APRESOLINE) injection 10 mg (has no administration in time range)  fentaNYL (SUBLIMAZE) injection 25 mcg (has no administration in time range)  fentaNYL (SUBLIMAZE) injection 25-100 mcg (has no administration in time range)  midazolam (VERSED) injection 1 mg (has no administration in time range)  midazolam (VERSED) injection 1 mg (has no administration in time range)  furosemide (LASIX) injection 40 mg (has no administration in time range)  norepinephrine (LEVOPHED) 4mg  in 256mL premix infusion (has no administration in time range)  EPINEPHrine (ADRENALIN) 4 mg in dextrose 5 % 250 mL (0.016 mg/mL) infusion (has no administration in time range)  etomidate (AMIDATE) injection (20 mg Intravenous Given 06/22/18 1915)  rocuronium (ZEMURON) injection (100 mg Intravenous Given 06/22/18 1916)     Initial Impression / Assessment and Plan / ED Course  I have reviewed the triage vital signs and the nursing notes.  Pertinent labs & imaging results that were available during my care of the patient were reviewed by me and considered in my medical decision making (see chart for details).        Consult: Cardiology consulted for new left bundle branch block with ischemic appearing hyperacute T waves.  Bedside echocardiogram performed by Dr. Sallyanne Kuster.  Estimated EF 50%.  No pericardial effusion.  At this time, cardiology did not feel patient was exhibiting signs of STEMI.  We will continue to follow.  Consult: Intensivist for admission.  Patient presents in severe  respiratory distress.  Per EMS, oxygen saturation in the field was 40%.  After discussion with the patient's daughter via phone conversation, patient does have chronic  respiratory failure as well.  This is been worsening for days to weeks.  Acutely she became much worse and was not responding to her usual oxygen or nitroglycerin.  On arrival patient had severe respiratory distress with significant increased work of breathing and bilateral pulmonary infiltrates.  Patient was intubated for respiratory failure.  This protocol initiated.  Patient also found a significant hyperglycemia with anion gap.  Insulin drip initiated.  Patient did show significant improvement once on the ventilator with heart rate improving from 130s to normal sinus rhythm 60s to 70s.  Patient maintaining oxygen saturation on the ventilator.  Admitted for ongoing management and diagnostic evaluation.  Patient's daughter can be contacted at [phone] for additional information and updates.  Final Clinical Impressions(s) / ED Diagnoses   Final diagnoses:  Acute on chronic respiratory failure with hypoxia and hypercapnia (HCC)  Sepsis, due to unspecified organism, unspecified whether acute organ dysfunction present [facility])  Severe comorbid illness    ED Discharge Orders    None       Charlesetta Shanks, MD 06/22/18 2341

## 2018-06-22 NOTE — ED Notes (Signed)
Emergency Contact listed in pt's chart is Cathleen Fears (daughter) @ [phone]

## 2018-06-22 NOTE — Consult Note (Addendum)
Cardiology Admission History and Physical:   Patient ID: Tanya [name]; MRN: [id number]; DOB: 04-07-1945   Admission date: 06/22/2018  Primary Care Provider: System, Pcp Not In Primary Cardiologist: No primary care provider on file. New here, Dr Shirlee More in [place] Primary Electrophysiologist:  None   Chief Complaint:  Chest pain, SOB  Patient Profile:   Tanya [name] is a 73 y.o. female with a history of cardiac issues, DM, HTN, morbid obesity, called EMS because of CP/SOB. resp rate 40 in the field. ECG abnormal, ?STEMI, Dr Johnney Killian asked [place] to see.   History of Present Illness:   Tanya [name] is not able to state when the chest pain started, says it is 10/10. She is very SOB, not able to say for how long, thinks it started at about the same time as the chest pain. No N&V, slightly diaphoretic.   Says has been getting up a lot at night to urinate, PND is not clear. Has orthopnea, denies LE edema. Says she saw Dr Shirlee More very recently by phone/video, not able to say what the plan was.  Pt very SOB, ++resp distress on NRB>>EDP intubating. Not able to get more info from her. No family in ER waiting, dtr did not answer the phone.  Denies hx fever or cough.   Past Medical History:  Diagnosis Date  . Acute respiratory failure with hypoxia and hypercapnia ([place]) 06/22/2018  . Diabetes ([place])   . HTN (hypertension)     Past Surgical History:  Procedure Laterality Date  . MASTECTOMY Left 1993     Medications Prior to Admission: Prior to Admission medications   Not on File     Allergies:    Allergies  Allergen Reactions  . Levaquin [Levofloxacin]   . Pneumovax [Pneumococcal Polysaccharide Vaccine]     Social History:  No additional family, social, med hx or ROS is available due to pt condition.  Social History   Socioeconomic History  . Marital status: Single    Spouse name: Not on file  . Number of children: Not on file  . Years of education: Not on file  .  Highest education level: Not on file  Occupational History  . Not on file  Social Needs  . Financial resource strain: Not on file  . Food insecurity:    Worry: Not on file    Inability: Not on file  . Transportation needs:    Medical: Not on file    Non-medical: Not on file  Tobacco Use  . Smoking status: Not on file  Substance and Sexual Activity  . Alcohol use: Not on file  . Drug use: Not on file  . Sexual activity: Not on file  Lifestyle  . Physical activity:    Days per week: Not on file    Minutes per session: Not on file  . Stress: Not on file  Relationships  . Social connections:    Talks on phone: Not on file    Gets together: Not on file    Attends religious service: Not on file    Active member of club or organization: Not on file    Attends meetings of clubs or organizations: Not on file    Relationship status: Not on file  . Intimate partner violence:    Fear of current or ex partner: Not on file    Emotionally abused: Not on file    Physically abused: Not on file    Forced sexual activity: Not on file  Other Topics Concern  . Not on file  Social History Narrative  . Not on file     Family History:   The patient's family history is not on file.   The patient has no family status information on file.   ROS:  Please see the history of present illness.  No other info available  Physical Exam/Data:   Vitals:   06/22/18 1915 06/22/18 1918 06/22/18 1920 06/22/18 1933  BP: (!) 146/77 139/87 (!) 105/53   Pulse: (!) 115 (!) 122 (!) 118   Resp: (!) 21 (!) 25 20   Temp:      TempSrc:      SpO2: 97% 95% 99%   Weight:    220 lb 7.4 oz (100 kg)  Height:    5\' 2"  (1.575 m)   No intake or output data in the 24 hours ending 06/22/18 2019 Filed Weights   06/22/18 1933  Weight: 220 lb 7.4 oz (100 kg)   Body mass index is 40.32 kg/m.  General:  Morbidly obese female, in acute respiratory distress. HEENT: normal for age Lymph: no adenopathy Neck:  JVD  not seen elevated, difficult to assess 2nd body habitus Endocrine:  No thryomegaly Vascular: No carotid bruits; FA pulses 2+ bilaterally without bruits  Cardiac:  normal S1, S2; RRR; SEM and DM murmur, no rub or gallop  Lungs:  Rales bases bilaterally, no wheezing, rhonchi   Abd: soft, nontender, no hepatomegaly  Ext: no edema Musculoskeletal:  No deformities, BUE and BLE strength normal and equal Skin: warm and dry  Neuro:  CNs 2-12 intact, no focal abnormalities noted Psych:  Normal affect    EKG:  The ECG that was done 04/14 is ST, HR 111,  LBBB and LVH, personally reviewed   Relevant CV Studies:  ECHO: bedside echo w/ near-nl EF, MV calcium  Laboratory Data: ABG    Component Value Date/Time   HCO3 22.7 06/22/2018 1859   TCO2 24 06/22/2018 1859   ACIDBASEDEF 5.0 (H) 06/22/2018 1859   O2SAT 98.0 06/22/2018 1859   Chemistry Recent Labs  Lab 06/22/18 1859  NA 134*  K 5.0  CREATININE 0.80    No results for input(s): PROT, ALBUMIN, AST, ALT, ALKPHOS, BILITOT in the last 168 hours. Hematology Recent Labs  Lab 06/22/18 1859 06/22/18 1905  WBC  --  18.0*  RBC  --  4.51  HGB 14.3 13.7  HCT 42.0 44.4  MCV  --  98.4  MCH  --  30.4  MCHC  --  30.9  RDW  --  14.2  PLT  --  465*   Cardiac EnzymesNo results for input(s): TROPONINI in the last 168 hours.  Recent Labs  Lab 06/22/18 1857  TROPIPOC 0.02    BNP Recent Labs  Lab 06/22/18 1905  BNP 447.3*      Radiology/Studies:  Dg Chest Portable 1 View  Result Date: 06/22/2018 CLINICAL DATA:  Check endotracheal tube placement EXAM: PORTABLE CHEST 1 VIEW COMPARISON:  06/22/2018 FINDINGS: Cardiac shadow is stable. Endotracheal tube and gastric catheter are noted in satisfactory position. Stable changes of vascular congestion and asymmetric edema are again noted. No new focal abnormality is seen. Left shoulder replacement is noted. IMPRESSION: Increased vascular congestion and asymmetric edema right greater than left.  Tubes and lines as described. Electronically Signed   By: Inez Catalina M.D.   On: 06/22/2018 20:16   Dg Chest Port 1 View  Result Date: 06/22/2018 CLINICAL DATA:  Chest pain and  shortness of breath. EXAM: PORTABLE CHEST 1 VIEW COMPARISON:  04/27/2003. FINDINGS: 1850 hours. The cardio pericardial silhouette is enlarged. Diffuse interstitial and bibasilar airspace disease evident, right greater than left. Tiny bilateral pleural effusions. Bones are diffusely demineralized. Patient is status post left shoulder replacement. Telemetry leads overlie the chest. IMPRESSION: Cardiomegaly with interstitial and asymmetric airspace disease, right greater than left. Tiny bilateral pleural effusions. Imaging features suggest asymmetric pulmonary edema although diffuse infection not excluded. Electronically Signed   By: Misty Stanley M.D.   On: 06/22/2018 19:24    Assessment and Plan:   Principal Problem:   Acute respiratory failure with hypoxia and hypercapnia (HCC) - pt intubated by EDP, O2 sats approx 95% on vent - Dr C did quick look echo, EF nearly nl and no obvious WMA - discuss diuresis w/ MD, continue to cycle ez, do full echo in am. - decide on further eval once more data in.   For questions or updates, please contact [place] Please consult [email or web address] for contact info under Cardiology/STEMI.    Signed, Sanda Klein, MD  06/22/2018 8:19 PM   I have seen and examined the patient along with Rosaria Ferries, PA-C.  I have reviewed the chart, notes and new data.  I agree with PA/NP's note.  Key new complaints: intubated, unresponsive. Unable to obtain additional history Key examination changes: severely obese, bilateral rales, RRR, aortic ejection murmur and possible faint diastolic murmur Key new findings / data: bedside echo was technically difficult, but shows LVEF 50% or higher, LBBB related septal dyssynchrony, but no other obvious wall motion abnormality. 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 Unable to quantify AS (no spectral Doppler). No pericardial effusion. ECG -NSR and LBBB of uncertain chronicity. Respiratory and metabolic acidosis, increased Aa gradient. WBC is 18K, without lymphopenia. CXR with bilateral infiltrates, could represent pulmonary edema or atypical pneumonia.  PLAN: Sepsis protocol, avoiding excess IV fluids. Formal echo after COVID 19 negative - may have significant AS and possibly MS. Trend troponin. No clear indication for IV heparin unless enzymes are more than trivially elevated.  Sanda Klein, MD, [place] [phone] 06/22/2018, 8:19 PM

## 2018-06-22 NOTE — ED Triage Notes (Signed)
Pt here for chest pain today, sob, diaphoretic. Pt took one nitro at home, EMS gave I nitro, 324 ASA, 4 morphine pta.

## 2018-06-22 NOTE — Procedures (Signed)
Central Venous Catheter Insertion Procedure Note Tanya [name] [id number] April 27, 1945  Procedure: Insertion of Central Venous Catheter Indications: Assessment of intravascular volume, Drug and/or fluid administration and Frequent blood sampling  Procedure Details Consent: Unable to obtain consent because of emergent medical necessity. Time Out: Verified patient identification, verified procedure, site/side was marked, verified correct patient position, special equipment/implants available, medications/allergies/relevent history reviewed, required imaging and test results available.  Performed  Maximum sterile technique was used including antiseptics, cap, gloves, gown, hand hygiene, mask and sheet. Skin prep: Chlorhexidine; local anesthetic administered A antimicrobial bonded/coated triple lumen catheter was placed in the right internal jugular vein using the Seldinger technique.  Evaluation Blood flow good Complications: No apparent complications Patient did tolerate procedure well. Chest X-ray ordered to verify placement.  CXR: pending.  Renee Pain, MD Board Certified by the ABIM, Pulmonary Diseases & Critical Care Medicine   06/22/2018, 10:55 PM

## 2018-06-23 ENCOUNTER — Inpatient Hospital Stay (HOSPITAL_COMMUNITY): Payer: Medicare Other

## 2018-06-23 ENCOUNTER — Encounter (HOSPITAL_COMMUNITY): Payer: Self-pay | Admitting: *Deleted

## 2018-06-23 DIAGNOSIS — I350 Nonrheumatic aortic (valve) stenosis: Secondary | ICD-10-CM

## 2018-06-23 DIAGNOSIS — I351 Nonrheumatic aortic (valve) insufficiency: Secondary | ICD-10-CM

## 2018-06-23 DIAGNOSIS — I361 Nonrheumatic tricuspid (valve) insufficiency: Secondary | ICD-10-CM

## 2018-06-23 DIAGNOSIS — I5043 Acute on chronic combined systolic (congestive) and diastolic (congestive) heart failure: Secondary | ICD-10-CM

## 2018-06-23 LAB — RESPIRATORY PANEL BY PCR

## 2018-06-23 LAB — COOXEMETRY PANEL
Carboxyhemoglobin: 2 % — ABNORMAL HIGH (ref 0.5–1.5)
Methemoglobin: 0.8 % (ref 0.0–1.5)
O2 Saturation: 81.8 %
Total hemoglobin: 12.4 g/dL (ref 12.0–16.0)

## 2018-06-23 LAB — POCT I-STAT 7, (LYTES, BLD GAS, ICA,H+H)
Acid-Base Excess: 3 mmol/L — ABNORMAL HIGH (ref 0.0–2.0)
Bicarbonate: 28.2 mmol/L — ABNORMAL HIGH (ref 20.0–28.0)
Calcium, Ion: 1.11 mmol/L — ABNORMAL LOW (ref 1.15–1.40)
HCT: 36 % (ref 36.0–46.0)
Hemoglobin: 12.2 g/dL (ref 12.0–15.0)
O2 Saturation: 97 %
Patient temperature: 97.8
Potassium: 3.7 mmol/L (ref 3.5–5.1)
Sodium: 138 mmol/L (ref 135–145)
TCO2: 30 mmol/L (ref 22–32)
pCO2 arterial: 45.2 mmHg (ref 32.0–48.0)
pH, Arterial: 7.401 (ref 7.350–7.450)
pO2, Arterial: 86 mmHg (ref 83.0–108.0)

## 2018-06-23 LAB — GLUCOSE, CAPILLARY
Glucose-Capillary: 138 mg/dL — ABNORMAL HIGH (ref 70–99)
Glucose-Capillary: 147 mg/dL — ABNORMAL HIGH (ref 70–99)
Glucose-Capillary: 152 mg/dL — ABNORMAL HIGH (ref 70–99)
Glucose-Capillary: 160 mg/dL — ABNORMAL HIGH (ref 70–99)
Glucose-Capillary: 161 mg/dL — ABNORMAL HIGH (ref 70–99)
Glucose-Capillary: 164 mg/dL — ABNORMAL HIGH (ref 70–99)
Glucose-Capillary: 166 mg/dL — ABNORMAL HIGH (ref 70–99)
Glucose-Capillary: 168 mg/dL — ABNORMAL HIGH (ref 70–99)
Glucose-Capillary: 171 mg/dL — ABNORMAL HIGH (ref 70–99)
Glucose-Capillary: 171 mg/dL — ABNORMAL HIGH (ref 70–99)
Glucose-Capillary: 173 mg/dL — ABNORMAL HIGH (ref 70–99)
Glucose-Capillary: 192 mg/dL — ABNORMAL HIGH (ref 70–99)
Glucose-Capillary: 201 mg/dL — ABNORMAL HIGH (ref 70–99)
Glucose-Capillary: 209 mg/dL — ABNORMAL HIGH (ref 70–99)
Glucose-Capillary: 212 mg/dL — ABNORMAL HIGH (ref 70–99)
Glucose-Capillary: 274 mg/dL — ABNORMAL HIGH (ref 70–99)

## 2018-06-23 LAB — SEDIMENTATION RATE: Sed Rate: 7 mm/hr (ref 0–22)

## 2018-06-23 LAB — BASIC METABOLIC PANEL
Anion gap: 10 (ref 5–15)
Anion gap: 9 (ref 5–15)
BUN: 15 mg/dL (ref 8–23)
BUN: 22 mg/dL (ref 8–23)
CO2: 24 mmol/L (ref 22–32)
CO2: 27 mmol/L (ref 22–32)
Calcium: 7.8 mg/dL — ABNORMAL LOW (ref 8.9–10.3)
Calcium: 8 mg/dL — ABNORMAL LOW (ref 8.9–10.3)
Chloride: 102 mmol/L (ref 98–111)
Chloride: 106 mmol/L (ref 98–111)
Creatinine, Ser: 0.68 mg/dL (ref 0.44–1.00)
Creatinine, Ser: 0.76 mg/dL (ref 0.44–1.00)
GFR calc Af Amer: >60 mL/min (ref >60)
GFR calc Af Amer: >60 mL/min (ref >60)
GFR calc non Af Amer: >60 mL/min (ref >60)
GFR calc non Af Amer: >60 mL/min (ref >60)
Glucose, Bld: 179 mg/dL — ABNORMAL HIGH (ref 70–99)
Glucose, Bld: 205 mg/dL — ABNORMAL HIGH (ref 70–99)
Potassium: 3.3 mmol/L — ABNORMAL LOW (ref 3.5–5.1)
Potassium: 4.2 mmol/L (ref 3.5–5.1)
Sodium: 139 mmol/L (ref 135–145)
Sodium: 139 mmol/L (ref 135–145)

## 2018-06-23 LAB — CBC
HCT: 36.5 % (ref 36.0–46.0)
HCT: 37.1 % (ref 36.0–46.0)
Hemoglobin: 11.8 g/dL — ABNORMAL LOW (ref 12.0–15.0)
Hemoglobin: 11.8 g/dL — ABNORMAL LOW (ref 12.0–15.0)
MCH: 30.9 pg (ref 26.0–34.0)
MCH: 31.3 pg (ref 26.0–34.0)
MCHC: 31.8 g/dL (ref 30.0–36.0)
MCHC: 32.3 g/dL (ref 30.0–36.0)
MCV: 96.8 fL (ref 80.0–100.0)
MCV: 97.1 fL (ref 80.0–100.0)
Platelets: 315 10*3/uL (ref 150–400)
Platelets: 320 10*3/uL (ref 150–400)
RBC: 3.77 MIL/uL — ABNORMAL LOW (ref 3.87–5.11)
RBC: 3.82 MIL/uL — ABNORMAL LOW (ref 3.87–5.11)
RDW: 14.4 % (ref 11.5–15.5)
RDW: 14.4 % (ref 11.5–15.5)
WBC: 11.5 10*3/uL — ABNORMAL HIGH (ref 4.0–10.5)
WBC: 14.1 10*3/uL — ABNORMAL HIGH (ref 4.0–10.5)
nRBC: 0 % (ref 0.0–0.2)
nRBC: 0 % (ref 0.0–0.2)

## 2018-06-23 LAB — LACTIC ACID, PLASMA: Lactic Acid, Venous: 1.5 mmol/L (ref 0.5–1.9)

## 2018-06-23 LAB — FERRITIN: Ferritin: 203 ng/mL (ref 11–307)

## 2018-06-23 LAB — ECHOCARDIOGRAM COMPLETE
Height: 62 in
Weight: 3188.73 oz

## 2018-06-23 LAB — PHOSPHORUS
Phosphorus: 3.7 mg/dL (ref 2.5–4.6)
Phosphorus: 4.5 mg/dL (ref 2.5–4.6)

## 2018-06-23 LAB — HEMOGLOBIN A1C
Hgb A1c MFr Bld: 8.5 % — ABNORMAL HIGH (ref 4.8–5.6)
Mean Plasma Glucose: 197.25 mg/dL

## 2018-06-23 LAB — SARS CORONAVIRUS 2 BY RT PCR (HOSPITAL ORDER, PERFORMED IN ~~LOC~~ HOSPITAL LAB): SARS Coronavirus 2: NEGATIVE

## 2018-06-23 LAB — PROCALCITONIN: Procalcitonin: <0.1 ng/mL

## 2018-06-23 LAB — TROPONIN I
Troponin I: 0.21 ng/mL (ref <0.03)
Troponin I: 0.54 ng/mL (ref <0.03)
Troponin I: 0.85 ng/mL (ref <0.03)

## 2018-06-23 LAB — C-REACTIVE PROTEIN: CRP: <0.8 mg/dL (ref <1.0)

## 2018-06-23 LAB — TSH: TSH: 0.871 u[IU]/mL (ref 0.350–4.500)

## 2018-06-23 LAB — MRSA PCR SCREENING: MRSA by PCR: NEGATIVE

## 2018-06-23 LAB — ABO/RH: ABO/RH(D): O POS

## 2018-06-23 LAB — BETA-HYDROXYBUTYRIC ACID: Beta-Hydroxybutyric Acid: 0.19 mmol/L (ref 0.05–0.27)

## 2018-06-23 LAB — MAGNESIUM
Magnesium: 1.3 mg/dL — ABNORMAL LOW (ref 1.7–2.4)
Magnesium: 1.4 mg/dL — ABNORMAL LOW (ref 1.7–2.4)

## 2018-06-23 MED ORDER — INSULIN ASPART 100 UNIT/ML ~~LOC~~ SOLN: 0.0000 [IU] | SUBCUTANEOUS | Status: DC

## 2018-06-23 MED ORDER — ORAL CARE MOUTH RINSE
15.0000 mL | OROMUCOSAL | Status: DC
  Administered 2018-06-23 – 2018-06-24 (×14): 15 mL via OROMUCOSAL

## 2018-06-23 MED ORDER — ASPIRIN 81 MG PO CHEW
81.0000 mg | CHEWABLE_TABLET | Freq: Every day | ORAL | Status: DC
  Administered 2018-06-23 – 2018-06-28 (×5): 81 mg
  Filled 2018-06-23 (×5): qty 1

## 2018-06-23 MED ORDER — INSULIN ASPART 100 UNIT/ML ~~LOC~~ SOLN
0.0000 [IU] | SUBCUTANEOUS | Status: DC
  Administered 2018-06-23: 5 [IU] via SUBCUTANEOUS
  Administered 2018-06-24: 2 [IU] via SUBCUTANEOUS
  Administered 2018-06-24 (×2): 3 [IU] via SUBCUTANEOUS

## 2018-06-23 MED ORDER — INSULIN DETEMIR 100 UNIT/ML ~~LOC~~ SOLN
14.0000 [IU] | Freq: Two times a day (BID) | SUBCUTANEOUS | Status: DC
  Administered 2018-06-23 – 2018-06-30 (×12): 14 [IU] via SUBCUTANEOUS
  Filled 2018-06-23 (×16): qty 0.14

## 2018-06-23 MED ORDER — ALBUTEROL SULFATE (2.5 MG/3ML) 0.083% IN NEBU: 2.5000 mg | INHALATION_SOLUTION | RESPIRATORY_TRACT | Status: DC | PRN

## 2018-06-23 MED ORDER — NITROGLYCERIN 0.4 MG SL SUBL
0.4000 mg | SUBLINGUAL_TABLET | SUBLINGUAL | Status: DC | PRN
  Administered 2018-06-23 (×2): 0.4 mg via SUBLINGUAL
  Filled 2018-06-23 (×2): qty 1

## 2018-06-23 MED ORDER — FUROSEMIDE 10 MG/ML IJ SOLN
40.0000 mg | Freq: Two times a day (BID) | INTRAMUSCULAR | Status: DC
  Administered 2018-06-23 – 2018-06-25 (×4): 40 mg via INTRAVENOUS
  Filled 2018-06-23 (×4): qty 4

## 2018-06-23 MED ORDER — MORPHINE SULFATE (PF) 2 MG/ML IV SOLN
INTRAVENOUS | Status: AC
  Administered 2018-06-23: 1 mg via INTRAVENOUS
  Filled 2018-06-23: qty 1

## 2018-06-23 MED ORDER — CHLORHEXIDINE GLUCONATE 0.12% ORAL RINSE (MEDLINE KIT)
15.0000 mL | Freq: Two times a day (BID) | OROMUCOSAL | Status: DC
  Administered 2018-06-23 – 2018-06-29 (×11): 15 mL via OROMUCOSAL

## 2018-06-23 MED ORDER — INSULIN DETEMIR 100 UNIT/ML ~~LOC~~ SOLN
10.0000 [IU] | Freq: Two times a day (BID) | SUBCUTANEOUS | Status: DC
  Administered 2018-06-23: 11:00:00 10 [IU] via SUBCUTANEOUS
  Filled 2018-06-23 (×3): qty 0.1

## 2018-06-23 MED ORDER — METOPROLOL TARTRATE 5 MG/5ML IV SOLN
INTRAVENOUS | Status: AC
  Filled 2018-06-23: qty 5

## 2018-06-23 MED ORDER — INSULIN ASPART 100 UNIT/ML ~~LOC~~ SOLN
0.0000 [IU] | Freq: Three times a day (TID) | SUBCUTANEOUS | Status: DC
  Administered 2018-06-23: 8 [IU] via SUBCUTANEOUS

## 2018-06-23 MED ORDER — FUROSEMIDE 10 MG/ML IJ SOLN
40.0000 mg | Freq: Once | INTRAMUSCULAR | Status: AC
  Administered 2018-06-23: 40 mg via INTRAVENOUS

## 2018-06-23 MED ORDER — METOPROLOL TARTRATE 5 MG/5ML IV SOLN
5.0000 mg | Freq: Once | INTRAVENOUS | Status: AC
  Administered 2018-06-23: 15:00:00 5 mg via INTRAVENOUS

## 2018-06-23 MED ORDER — POTASSIUM CHLORIDE 20 MEQ/15ML (10%) PO SOLN: 40.0000 meq | Freq: Once | ORAL | Status: DC

## 2018-06-23 MED ORDER — MORPHINE SULFATE (PF) 2 MG/ML IV SOLN
1.0000 mg | Freq: Once | INTRAVENOUS | Status: AC
  Administered 2018-06-23: 15:00:00 1 mg via INTRAVENOUS

## 2018-06-23 MED ORDER — POTASSIUM CHLORIDE 10 MEQ/100ML IV SOLN
10.0000 meq | INTRAVENOUS | Status: AC
  Administered 2018-06-23 (×4): 10 meq via INTRAVENOUS
  Filled 2018-06-23 (×4): qty 100

## 2018-06-23 NOTE — Progress Notes (Signed)
[facility] Progress Note Patient Name: Tanya [name] DOB: [birth year] MRN: [id number]   Date of Service  06/23/2018  HPI/Events of Note  Hyperglycemia - Blood glucsoe = 209. Now on Levemir and AC Novolog SSI. Now NPO on BiPAP.   eICU Interventions  Will order: 1. Change to Q 4 hour moderate Novolog SSI.      Intervention Category Major Interventions: Hyperglycemia - active titration of insulin therapy  Lysle Dingwall 06/23/2018, 11:05 PM

## 2018-06-23 NOTE — Progress Notes (Signed)
RT NOTE: RT called to room because patient sats in 80's. RT and RN placed patient on NRB and sats still in the 80's. RT then placed patient on bipap and sats started to rise. Patient states that she is comfortable on bipap. RT will continue to monitor.

## 2018-06-23 NOTE — Progress Notes (Signed)
  Echocardiogram 2D Echocardiogram has been performed.  Tanya [name] 06/23/2018, 3:56 PM

## 2018-06-23 NOTE — Progress Notes (Signed)
RT NOTE: RT called to patients room to try her off bipap. RT placed patient on 3L [place] and within a few minutes patient asked RT to place her back on bipap due to feeling some shortness of breath. RT placed patient back on bipap and RN is aware. RT will continue to monitor.

## 2018-06-23 NOTE — Progress Notes (Signed)
Discussed with ID. COVID testing is negative.  Pt is stable post extubation Denies any sick contact or travel. She has been isolating her self at home  Will take of COVID isolation precautions Transfer out of ICU.  Marshell Garfinkel MD [facility] Pager [phone] If no answer call [phone] 06/23/2018, 10:25 AM

## 2018-06-23 NOTE — Progress Notes (Signed)
[facility] Progress Note Patient Name: Tanya [name] DOB: 09-27-1945 MRN: [id number]   Date of Service  06/23/2018  HPI/Events of Note  Troponin #1 = 0.21.   eICU Interventions  Will order: 1. ASA 81 mg per tube now and Q day. 2.Continue to trend Troponin.      Intervention Category Intermediate Interventions: Diagnostic test evaluation  Kewon Statler [place] 06/23/2018, 2:24 AM

## 2018-06-23 NOTE — Progress Notes (Signed)
Patient states that she feels better however gets anxious when trying to come off bipap.

## 2018-06-23 NOTE — Progress Notes (Signed)
CRITICAL VALUE ALERT  Critical Value: Troponin 0.85  Date & Time Notied:  4/15 0625  Provider Notified: E-link MD  Orders Received/Actions taken: awaiting orders

## 2018-06-23 NOTE — Progress Notes (Signed)
Patient called out states that she is having chest tightness denies chest pain. EKG done nitro 0.4mg  sl given state chest xray. Troponin level sent to lab. Am lab clotted. Dr. Vaughan Browner  Notified.

## 2018-06-23 NOTE — Progress Notes (Signed)
Inpatient Diabetes Program Recommendations  AACE/ADA: New Consensus Statement on Inpatient Glycemic Control (2015)  Target Ranges:  Prepandial:   less than 140 mg/dL      Peak postprandial:   less than 180 mg/dL (1-2 hours)      Critically ill patients:  140 - 180 mg/dL   Lab Results  Component Value Date   GLUCAP 138 (H) 06/23/2018   HGBA1C 8.5 (H) 06/23/2018    Review of Glycemic Control  Diabetes history: DM  Outpatient Diabetes medications: Unknown - Pharmacy tech to f/u with family today regarding home meds  Current orders for Inpatient glycemic control: Transitioned  from insulin drip to Levemir 10 units BID/ Novolog moderate (0-15 units) tid    Patient admitted with DKA and labs have improved this am (CO 27 and AG 10). Lab glucose on arrival was 429. Most recent CBG 138mg /dl at 1212.   Hgb A1c 8.5% (average of 195 mg/dl over the past 2-3 months). Goal A1c is 7%. Will inform patient of Hgb A1c (to be transferred out of ICU today) therefore will contact patient tomorrow.    -- Will follow during hospitalization.--  Jonna Clark RN, MSN Diabetes Coordinator Inpatient Glycemic Control Team Team Pager: [phone] (8am-5pm)

## 2018-06-23 NOTE — Progress Notes (Signed)
PCCM progress note  Called to bedside as pt started complaining of chest discomfort and became tachycardic, diaphoretic with low O2 sats  Serial EKGs show trasient afib > sinus tachycardia with LBBB Placed on bipap, given SL nitro x 2, morphine 1 mg, lasix 40 mg and lopressor 5 mg Repeat troponin is pending Echo in progress. Spoke with cardiology who will see patient  The patient is critically ill with multiple organ system failure and requires high complexity decision making for assessment and support, frequent evaluation and titration of therapies, advanced monitoring, review of radiographic studies and interpretation of complex data.   Critical Care Time devoted to patient care services, exclusive of separately billable procedures, described in this note is 35 minutes.   Marshell Garfinkel MD [facility] Pager [phone] If no answer call [phone] 06/23/2018, 3:32 PM

## 2018-06-23 NOTE — Progress Notes (Signed)
Dr. Vaughan Browner in to see patient at 1430. Orders given Patient states that she is still having some tightness in her chest denies pain. Patient in [place] will continue to monitor. Dr. Burt Knack cardiology notified will come to see patient.

## 2018-06-23 NOTE — Progress Notes (Signed)
Progress Note  Patient Name: Tanya [name] Date of Encounter: 06/23/2018  Primary Cardiologist: No primary care provider on file.   Subjective   Still with mild chest pressure, shortness of breath, but feeling a little better. She is currently on BiPap.   Inpatient Medications    Scheduled Meds:  aspirin  81 mg Per Tube Daily   chlorhexidine gluconate (MEDLINE KIT)  15 mL Mouth Rinse BID   enoxaparin (LOVENOX) injection  40 mg Subcutaneous Q24H   furosemide  40 mg Intravenous Q12H   insulin aspart  0-15 Units Subcutaneous TID WC   insulin detemir  14 Units Subcutaneous BID   mouth rinse  15 mL Mouth Rinse 10 times per day   Continuous Infusions:  sodium chloride 150 mL/hr at 06/22/18 2141   sodium chloride Stopped (06/23/18 0127)   azithromycin Stopped (06/22/18 2159)   cefTRIAXone (ROCEPHIN)  IV Stopped (06/22/18 2300)   norepinephrine (LEVOPHED) Adult infusion Stopped (06/23/18 0935)   PRN Meds: albuterol, dextrose, fentaNYL (SUBLIMAZE) injection, fentaNYL (SUBLIMAZE) injection, hydrALAZINE, midazolam, midazolam, nitroGLYCERIN   Vital Signs    Vitals:   06/23/18 1200 06/23/18 1345 06/23/18 1500 06/23/18 1600  BP: (!) 110/54  (!) 154/95 115/62  Pulse: 71  (!) 133 (!) 106  Resp: (!) 21  (!) 28 (!) 26  Temp:      TempSrc:      SpO2: 97% 97% 97% 96%  Weight:      Height:        Intake/Output Summary (Last 24 hours) at 06/23/2018 1727 Last data filed at 06/23/2018 1200 Gross per 24 hour  Intake 1686.25 ml  Output 1575 ml  Net 111.25 ml   Last 3 Weights 06/23/2018 06/22/2018 06/22/2018  Weight (lbs) 199 lb 4.7 oz 199 lb 1.2 oz 220 lb 7.4 oz  Weight (kg) 90.4 kg 90.3 kg 100 kg      Telemetry    Sinus rhythm, episodes of atrial fibrillation with RVR - Personally Reviewed  ECG    Sinus tachycardia, LBBB - Personally Reviewed  Physical Exam  Elderly appearing woman, breathing comfortably on BiPap at present GEN: No acute distress.   Neck: JVP  difficult to assess secondary to body habitus Cardiac: tachy, irregular, 3/6 harsh late peaking systolic murmur at the RUSB, LLSB Respiratory: diminished BS bilaterally GI: Soft, nontender, non-distended, obese MS: No edema; No deformity. Neuro:  Nonfocal  Psych: Normal affect   Labs    Chemistry Recent Labs  Lab 06/22/18 1905  06/22/18 2338 06/23/18 0327 06/23/18 0946  NA 135   < > 139 138 139  K 5.1   < > 4.2 3.7 3.3*  CL 101  --  106  --  102  CO2 18*  --  24  --  27  GLUCOSE 429*  --  205*  --  179*  BUN 22  --  22  --  15  CREATININE 1.00  --  0.76  --  0.68  CALCIUM 8.8*  --  7.8*  --  8.0*  PROT 7.4  --   --   --   --   ALBUMIN 4.2  --   --   --   --   AST 48*  --   --   --   --   ALT 34  --   --   --   --   ALKPHOS 69  --   --   --   --   BILITOT 1.2  --   --   --   --  GFRNONAA 55*  --  >60  --  >60  GFRAA >60  --  >60  --  >60  ANIONGAP 16*  --  9  --  10   < > = values in this interval not displayed.     Hematology Recent Labs  Lab 06/22/18 1905  06/22/18 2343 06/23/18 0327 06/23/18 0438  WBC 18.0*  --  14.1*  --  11.5*  RBC 4.51  --  3.77*  --  3.82*  HGB 13.7   < > 11.8* 12.2 11.8*  HCT 44.4   < > 36.5 36.0 37.1  MCV 98.4  --  96.8  --  97.1  MCH 30.4  --  31.3  --  30.9  MCHC 30.9  --  32.3  --  31.8  RDW 14.2  --  14.4  --  14.4  PLT 465*  --  320  --  315   < > = values in this interval not displayed.    Cardiac Enzymes Recent Labs  Lab 06/22/18 2338 06/23/18 0438 06/23/18 1425  TROPONINI 0.21* 0.85* 0.54*    Recent Labs  Lab 06/22/18 1857  TROPIPOC 0.02     BNP Recent Labs  Lab 06/22/18 1905  BNP 447.3*     DDimer  Recent Labs  Lab 06/22/18 1935  DDIMER 0.87*     Radiology    Dg Chest 1 View  Result Date: 06/23/2018 CLINICAL DATA:  Shortness of breath EXAM: CHEST  1 VIEW COMPARISON:  06/23/2018 FINDINGS: Bilateral interstitial and alveolar airspace opacities with more focal bilateral lower lobe airspace disease.  Small bilateral pleural effusions. No pneumothorax. Stable cardiomegaly. Right jugular central venous catheter with the tip projecting over the SVC. No acute osseous abnormality. IMPRESSION: Bilateral lower lobe airspace disease concerning for pneumonia. Bilateral interstitial and alveolar airspace opacities with cardiomegaly likely reflecting pulmonary edema and/or associated multilobar pneumonia. Electronically Signed   By: Kathreen Devoid   On: 06/23/2018 15:59   Dg Chest Port 1 View  Result Date: 06/23/2018 CLINICAL DATA:  Acute hypercapnic respiratory failure. Endotracheal tube present. EXAM: PORTABLE CHEST 1 VIEW COMPARISON:  06/22/2018 FINDINGS: Endotracheal tube, right jugular central venous catheter, and orogastric tube remain in appropriate position. Stable mild cardiomegaly. Stable diffuse interstitial infiltrates, suspicious for pulmonary edema. Decreased lung volumes are seen, with increased atelectasis or consolidation in left retrocardiac lung base. Probable small bilateral pleural effusions, without change. IMPRESSION: 1. Decreased lung volumes with increased atelectasis or consolidation in left retrocardiac lung base. 2. Stable cardiomegaly and diffuse interstitial infiltrates. Probable small bilateral pleural effusions. Electronically Signed   By: Earle Gell M.D.   On: 06/23/2018 07:41   Dg Chest Portable 1 View  Result Date: 06/22/2018 CLINICAL DATA:  Status post central line placement EXAM: PORTABLE CHEST 1 VIEW COMPARISON:  06/22/2018 FINDINGS: Cardiac shadow remains enlarged. Endotracheal tube and nasogastric catheter are again noted and stable. New right jugular central line is noted at the cavoatrial junction. The degree of vascular congestion and asymmetric edema has improved in the interval from the prior exam. Right-sided pleural effusion is noted. No pneumothorax is seen. IMPRESSION: Improved aeration in the right lung with decrease in the degree of edema. Right effusion is noted.  Electronically Signed   By: Inez Catalina M.D.   On: 06/22/2018 23:35   Dg Chest Portable 1 View  Result Date: 06/22/2018 CLINICAL DATA:  Check endotracheal tube placement EXAM: PORTABLE CHEST 1 VIEW COMPARISON:  06/22/2018 FINDINGS: Cardiac shadow is stable. Endotracheal  tube and gastric catheter are noted in satisfactory position. Stable changes of vascular congestion and asymmetric edema are again noted. No new focal abnormality is seen. Left shoulder replacement is noted. IMPRESSION: Increased vascular congestion and asymmetric edema right greater than left. Tubes and lines as described. Electronically Signed   By: Inez Catalina M.D.   On: 06/22/2018 20:16   Dg Chest Port 1 View  Result Date: 06/22/2018 CLINICAL DATA:  Chest pain and shortness of breath. EXAM: PORTABLE CHEST 1 VIEW COMPARISON:  04/27/2003. FINDINGS: 1850 hours. The cardio pericardial silhouette is enlarged. Diffuse interstitial and bibasilar airspace disease evident, right greater than left. Tiny bilateral pleural effusions. Bones are diffusely demineralized. Patient is status post left shoulder replacement. Telemetry leads overlie the chest. IMPRESSION: Cardiomegaly with interstitial and asymmetric airspace disease, right greater than left. Tiny bilateral pleural effusions. Imaging features suggest asymmetric pulmonary edema although diffuse infection not excluded. Electronically Signed   By: Misty Stanley M.D.   On: 06/22/2018 19:24    Cardiac Studies   2D Echo: IMPRESSIONS    1. The left ventricle has mild-moderately reduced systolic function, with an ejection fraction of 40-45%. The cavity size was normal. Left ventricular diastolic function could not be evaluated. Left ventricular diffuse hypokinesis.  2. The right ventricle has normal systolic function. The cavity was normal. There is no increase in right ventricular wall thickness. Right ventricular systolic pressure is moderately elevated with an estimated pressure of  64.6 mmHg.  3. Left atrial size was moderately dilated.  4. Right atrial size was moderately dilated.  5. The mitral valve is degenerative. Mild thickening of the mitral valve leaflet. There is moderate mitral annular calcification present. Mitral valve regurgitation is moderate by color flow Doppler. Moderate mitral valve stenosis.  6. The aortic valve is tricuspid. Severely thickening of the aortic valve. Severe calcifcation of the aortic valve. Aortic valve regurgitation is moderate by color flow Doppler. Severe stenosis of the aortic valve.  7. Pulmonary hypertension is moderate.  LEFT VENTRICLE PLAX 2D LVIDd:         5.00 cm LVIDs:         3.80 cm LV PW:         1.10 cm LV IVS:        1.00 cm LVOT diam:     1.70 cm LV SV:         56 ml LV SV Index:   27.70 LVOT Area:     2.27 cm   LV Volumes (MOD) LV area d, A2C:    24.00 cm LV area d, A4C:    30.20 cm LV area s, A2C:    12.40 cm LV area s, A4C:    20.70 cm LV major d, A2C:   6.30 cm LV major d, A4C:   7.21 cm LV major s, A2C:   5.05 cm LV major s, A4C:   6.55 cm LV vol d, MOD A2C: 70.4 ml LV vol d, MOD A4C: 102.0 ml LV vol s, MOD A2C: 29.9 ml LV vol s, MOD A4C: 59.5 ml LV SV MOD A2C:     40.5 ml LV SV MOD A4C:     102.0 ml LV SV MOD BP:      43.4 ml  RIGHT VENTRICLE RV S prime:     15.90 cm/s RVOT diam:      3.65 cm TAPSE (M-mode): 2.9 cm  LEFT ATRIUM             Index  RIGHT ATRIUM           Index LA diam:        4.60 cm 2.41 cm/m  RA Area:     15.20 cm LA Vol (A2C):   73.0 ml 38.24 ml/m RA Volume:   38.30 ml  20.06 ml/m LA Vol (A4C):   77.4 ml 40.55 ml/m LA Biplane Vol: 82.3 ml 43.12 ml/m  AORTIC VALVE                     PULMONIC VALVE AV Area (Vmax):    0.64 cm      PV Vmax:       1.34 m/s AV Area (Vmean):   0.64 cm      PV Peak grad:  7.2 mmHg AV Area (VTI):     0.61 cm AV Vmax:           417.00 cm/s AV Vmean:          297.000 cm/s AV VTI:            0.746 m AV Peak Grad:      69.6  mmHg AV Mean Grad:      40.0 mmHg LVOT Vmax:         117.00 cm/s LVOT Vmean:        84.000 cm/s LVOT VTI:          0.200 m LVOT/AV VTI ratio: 0.27   AORTA Ao Root diam: 2.60 cm Ao Asc diam:  3.30 cm  MITRAL VALVE                TRICUSPID VALVE MV Area (PHT): 6.71 cm     TR Peak grad:   61.6 mmHg MV Peak grad:  17.8 mmHg    TR Vmax:        409.00 cm/s MV Mean grad:  11.0 mmHg MV Vmax:       2.11 m/s     SHUNTS MV Vmean:      153.5 cm/s   Systemic VTI:  0.20 m MV VTI:        0.33 m       Systemic Diam: 1.70 cm MV PHT:        32.77 msec   Pulmonic Diam: 3.65 cm MV Decel Time: 113 msec MV E velocity: 208.00 cm/s  Patient Profile     73 y.o. 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 Acute on chronic combined systolic and diastolic heart failure 2. Severe symptomatic aortic stenosis 3. Moderate mitral stenosis and moderate mitral regurgitation 4. Elevated troponin, demand ischemia versus ACS, likely the former  With flat, low-level troponin trend, I suspect current syndrome is related to demand ischemia in the setting of severe aortic stenosis and moderate mitral stenosis, but the patient is continuing to have intermittent chest pain. Recommend discontinuation of DVT-lovenox and starting her on systemic IV heparin. Would continue with IV diuresis with furosemide as you are doing. Will follow with you and I would anticipate proceeding with R/L heart catheterization once her respiratory status is improved. She has undergone some cardiac evaluation in the [place] but I am unable to access those records at present. She has not had cardiac catheterization to date.  Will request records or see if I can  access Care Everywhere to review. We ultimately may need to proceed with a cardiac CTA as we further work her up for consideration of TAVR or other treatment of valvular heart disease. As she clinically improves and comes off of BiPap, will obtain further history and details of her baseline functional capacity which will clearly impact decision making in treatment of her cardiac disease.   Will follow closely with you. Hopefully she will be stable to proceed with R/L heart catheterization on Friday.  For questions or updates, please contact [place] Please consult [email or web address] for contact info under     Signed, Sherren Mocha, MD  06/23/2018, 5:27 PM

## 2018-06-23 NOTE — Progress Notes (Signed)
NAME:  Tanya [name], MRN:  [id number], DOB:  1945/06/08, LOS: 1 ADMISSION DATE:  06/22/2018, CONSULTATION DATE:  4/14 REFERRING MD:  Johnney Killian - EM  CHIEF COMPLAINT:  Chest pain,  respiratory distress   Brief History   73 year old F who presented to ED for 10/10 chest pain. Intubated in ED   History of present illness   73 yo F PMH HTN, DM presented to [facility] ED with 10/10 chest pain, beginning several days prior. Patient exhibited respiratory distress with tachypnea, and associated tachycardia to 140s, and was intubated in the ED. Prior to being intubated, there was no known history of nausea, vomiting, URI sx, but patient did present with some diaphoresis. Family has not been able to provide history over the phone. In ED, patient started on insulin gtt for glucose >400. Patient started on azithromycin and ceftriaxone for WBC 18 with CXR revealing bilateral opacities. Cardiology consulted on patient in ED given presenting chest pain. Bedside echo revealed LVEF approximately 50%. ECG reveals LBBB.  Initial labs reveal WBC 18 without lymphopenia. LDH 241. D-Dimer  0.87. BNP 447. Glucose 429. CXR reveals bilat opacities. Patient is negative for COVID  Past Medical History  DM HTN  Significant Hospital Events   4/14 > admitted.   Consults:  Cardiology  Procedures:  4/14 ETT>   Significant Diagnostic Tests:  CXR 4/14> cardiomegaly. Bilateral ASD, R>L. Small bilateral pleural effusions. Personally reviewed.   Micro Data:  BCx 4/14> neg RVP 4/14> neg Tracheal Aspirate 4/14> COVID-19 4/14>  negative  Antimicrobials:  Azithromycin 4/14>> Cefepime 4/14>>   Interim history/subjective:  Weaning off levo On insulin drip. Gap closed On PSV weans  Objective   Blood pressure (!) 119/51, pulse 62, temperature 98.5 F (36.9 C), temperature source Oral, resp. rate (!) 27, height 5\' 2"  (1.575 m), weight 90.4 kg, SpO2 97 %. CVP:  [7 mmHg-9 mmHg] 9 mmHg  Vent Mode: CPAP;PSV FiO2 (%):  [40  %-100 %] 40 % Set Rate:  [16 bmp-20 bmp] 20 bmp Vt Set:  [400 mL] 400 mL PEEP:  [5 cmH20] 5 cmH20 Pressure Support:  [5 cmH20] 5 cmH20 Plateau Pressure:  [18 cmH20-29 cmH20] 18 cmH20   Intake/Output Summary (Last 24 hours) at 06/23/2018 5188 Last data filed at 06/23/2018 0800 Gross per 24 hour  Intake 1195.69 ml  Output 1390 ml  Net -194.31 ml   Filed Weights   06/22/18 1933 06/22/18 2300 06/23/18 0431  Weight: 100 kg 90.3 kg 90.4 kg    Examination: Gen:      No acute distress, Obese HEENT:  EOMI, sclera anicteric Neck:     No masses; no thyromegaly, ETT Lungs:    Clear to auscultation bilaterally; normal respiratory effort CV:         Regular rate and rhythm; no murmurs Abd:      + bowel sounds; soft, non-tender; no palpable masses, no distension Ext:    No edema; adequate peripheral perfusion Skin:      Warm and dry; no rash Neuro: Sedated, arousable  Resolved Hospital Problem list     Assessment & Plan:   Acute respiratory failure -tachypnea with hypoxia reported in ED -CXR with bilateral opacities, edema vs ?PNA -COVID-19 rule out  P Weaning on PSV Likely can get extubated today Follow cultures Continue ceftriaxone, azithromycin  Septic shock P Follow cultures Weaning off pressors   DM with acute hyperglycemia P Continue insulin gtt at this time If gap reamins closed on next BMET then  transition to SQ heparin Check Hb A1c  Chest Pain Bedside echo by cards reveals LVEF estimated 50%  ECG 4/14: Sinus tachycardia with LBBB  CXR reveals cardiomegaly, ?pulm edema BNP >400 Trop 0.02 P Cardiology following appreciate recs Echo today Follow troponin  HTN P Med rec per pharmacy  Best practice:  Diet: NPO  Pain/Anxiety/Delirium protocol (if indicated): PRN fentanyl, PRN versed  VAP protocol (if indicated): yes DVT prophylaxis: lovenox  GI prophylaxis: protonix  Glucose control: insulin gtt  Mobility: bedrest Code Status: full  Family  Communication: daughter updated Disposition: Admit to ICU   Labs   CBC: Recent Labs  Lab 06/22/18 1905 06/22/18 2133 06/22/18 2343 06/23/18 0327 06/23/18 0438  WBC 18.0*  --  14.1*  --  11.5*  NEUTROABS 12.2*  --   --   --   --   HGB 13.7 11.9* 11.8* 12.2 11.8*  HCT 44.4 35.0* 36.5 36.0 37.1  MCV 98.4  --  96.8  --  97.1  PLT 465*  --  320  --  761    Basic Metabolic Panel: Recent Labs  Lab 06/22/18 1859 06/22/18 1905 06/22/18 2133 06/22/18 2338 06/23/18 0327 06/23/18 0438  NA 134* 135 136 139 138  --   K 5.0 5.1 5.0 4.2 3.7  --   CL  --  101  --  106  --   --   CO2  --  18*  --  24  --   --   GLUCOSE  --  429*  --  205*  --   --   BUN  --  22  --  22  --   --   CREATININE 0.80 1.00  --  0.76  --   --   CALCIUM  --  8.8*  --  7.8*  --   --   MG  --  1.7  --  1.4*  --  1.3*  PHOS  --  7.6*  --  4.5  --  3.7   GFR: Estimated Creatinine Clearance: 63.5 mL/min (by C-G formula based on SCr of 0.76 mg/dL). Recent Labs  Lab 06/22/18 1905 06/22/18 2343 06/23/18 0021 06/23/18 0438  PROCALCITON  --  <0.10  --   --   WBC 18.0* 14.1*  --  11.5*  LATICACIDVEN  --   --  1.5  --     Liver Function Tests: Recent Labs  Lab 06/22/18 1905  AST 48*  ALT 34  ALKPHOS 69  BILITOT 1.2  PROT 7.4  ALBUMIN 4.2   Recent Labs  Lab 06/22/18 1905  LIPASE 23   No results for input(s): AMMONIA in the last 168 hours.  ABG    Component Value Date/Time   PHART 7.401 06/23/2018 0327   PCO2ART 45.2 06/23/2018 0327   PO2ART 86.0 06/23/2018 0327   HCO3 28.2 (H) 06/23/2018 0327   TCO2 30 06/23/2018 0327   ACIDBASEDEF 2.0 06/22/2018 2133   O2SAT 97.0 06/23/2018 0327     Coagulation Profile: Recent Labs  Lab 06/22/18 1935  INR 1.2    Cardiac Enzymes: Recent Labs  Lab 06/22/18 2338 06/23/18 0438  TROPONINI 0.21* 0.85*    HbA1C: No results found for: HGBA1C  CBG: Recent Labs  Lab 06/23/18 0408 06/23/18 0518 06/23/18 0617 06/23/18 0729 06/23/18 0818   GLUCAP 164* 152* 171* 160* 161*   The patient is critically ill with multiple organ system failure and requires high complexity decision making for assessment and support, frequent evaluation  and titration of therapies, advanced monitoring, review of radiographic studies and interpretation of complex data.   Critical Care Time devoted to patient care services, exclusive of separately billable procedures, described in this note is 35 minutes.   Marshell Garfinkel MD [facility] Pager [phone] If no answer call 336 [phone] 06/23/2018, 8:53 AM

## 2018-06-23 NOTE — Procedures (Signed)
Extubation Procedure Note  Patient Details:   Name: Tanya [name] DOB: January 07, 1946 MRN: [id number]   Airway Documentation:    Vent end date: 06/23/18 Vent end time: 1005   Evaluation  O2 sats: stable throughout Complications: No apparent complications Patient did tolerate procedure well. Bilateral Breath Sounds: Clear, Diminished   Yes   RT extubated patient to 3L [place] with RN at bedside per MD order. Positive cuff leak noted. Patient tolerated well and states breathing is comfortable. No stridor noted. RT will continue to monitor as needed.   Vernona Rieger 06/23/2018, 10:17 AM

## 2018-06-23 NOTE — Progress Notes (Signed)
CRITICAL VALUE ALERT  Critical Value:  Troponin 0.21  Date & Time Notied:  4/15  0120  Provider Notified: Abby via E-link  Orders Received/Actions taken: no orders given at this time

## 2018-06-24 ENCOUNTER — Inpatient Hospital Stay (HOSPITAL_COMMUNITY): Payer: Medicare Other

## 2018-06-24 DIAGNOSIS — I350 Nonrheumatic aortic (valve) stenosis: Secondary | ICD-10-CM

## 2018-06-24 DIAGNOSIS — I5041 Acute combined systolic (congestive) and diastolic (congestive) heart failure: Secondary | ICD-10-CM

## 2018-06-24 DIAGNOSIS — A419 Sepsis, unspecified organism: Secondary | ICD-10-CM

## 2018-06-24 DIAGNOSIS — I272 Pulmonary hypertension, unspecified: Secondary | ICD-10-CM

## 2018-06-24 DIAGNOSIS — I5021 Acute systolic (congestive) heart failure: Secondary | ICD-10-CM

## 2018-06-24 LAB — BASIC METABOLIC PANEL
Anion gap: 12 (ref 5–15)
BUN: 15 mg/dL (ref 8–23)
CO2: 27 mmol/L (ref 22–32)
Calcium: 8.3 mg/dL — ABNORMAL LOW (ref 8.9–10.3)
Chloride: 101 mmol/L (ref 98–111)
Creatinine, Ser: 0.69 mg/dL (ref 0.44–1.00)
GFR calc Af Amer: >60 mL/min (ref >60)
GFR calc non Af Amer: >60 mL/min (ref >60)
Glucose, Bld: 146 mg/dL — ABNORMAL HIGH (ref 70–99)
Potassium: 3.8 mmol/L (ref 3.5–5.1)
Sodium: 140 mmol/L (ref 135–145)

## 2018-06-24 LAB — CBC
HCT: 37.9 % (ref 36.0–46.0)
Hemoglobin: 12.5 g/dL (ref 12.0–15.0)
MCH: 31.6 pg (ref 26.0–34.0)
MCHC: 33 g/dL (ref 30.0–36.0)
MCV: 95.9 fL (ref 80.0–100.0)
Platelets: 300 10*3/uL (ref 150–400)
RBC: 3.95 MIL/uL (ref 3.87–5.11)
RDW: 14.3 % (ref 11.5–15.5)
WBC: 9.9 10*3/uL (ref 4.0–10.5)
nRBC: 0 % (ref 0.0–0.2)

## 2018-06-24 LAB — GLUCOSE, CAPILLARY
Glucose-Capillary: 141 mg/dL — ABNORMAL HIGH (ref 70–99)
Glucose-Capillary: 170 mg/dL — ABNORMAL HIGH (ref 70–99)
Glucose-Capillary: 158 mg/dL — ABNORMAL HIGH (ref 70–99)
Glucose-Capillary: 178 mg/dL — ABNORMAL HIGH (ref 70–99)
Glucose-Capillary: 170 mg/dL — ABNORMAL HIGH (ref 70–99)

## 2018-06-24 LAB — MAGNESIUM: Magnesium: 1.4 mg/dL — ABNORMAL LOW (ref 1.7–2.4)

## 2018-06-24 LAB — INTERLEUKIN-6, PLASMA: Interleukin-6, Plasma: 8.6 pg/mL (ref 0.0–12.2)

## 2018-06-24 LAB — TROPONIN I: Troponin I: 0.44 ng/mL (ref <0.03)

## 2018-06-24 LAB — PHOSPHORUS: Phosphorus: 3.9 mg/dL (ref 2.5–4.6)

## 2018-06-24 MED ORDER — MAGNESIUM SULFATE 2 GM/50ML IV SOLN
2.0000 g | Freq: Once | INTRAVENOUS | Status: AC
  Administered 2018-06-24: 2 g via INTRAVENOUS
  Filled 2018-06-24: qty 50

## 2018-06-24 MED ORDER — INSULIN ASPART 100 UNIT/ML ~~LOC~~ SOLN
0.0000 [IU] | Freq: Every day | SUBCUTANEOUS | Status: DC
  Administered 2018-06-26: 3 [IU] via SUBCUTANEOUS
  Administered 2018-06-27: 2 [IU] via SUBCUTANEOUS
  Administered 2018-06-28: 4 [IU] via SUBCUTANEOUS

## 2018-06-24 MED ORDER — INSULIN ASPART 100 UNIT/ML ~~LOC~~ SOLN
0.0000 [IU] | Freq: Three times a day (TID) | SUBCUTANEOUS | Status: DC
  Administered 2018-06-24: 3 [IU] via SUBCUTANEOUS
  Administered 2018-06-25: 18:00:00 5 [IU] via SUBCUTANEOUS
  Administered 2018-06-25: 12:00:00 2 [IU] via SUBCUTANEOUS
  Administered 2018-06-26: 3 [IU] via SUBCUTANEOUS
  Administered 2018-06-26 – 2018-06-27 (×2): 2 [IU] via SUBCUTANEOUS
  Administered 2018-06-27 (×2): 3 [IU] via SUBCUTANEOUS
  Administered 2018-06-28: 8 [IU] via SUBCUTANEOUS
  Administered 2018-06-28 (×2): 5 [IU] via SUBCUTANEOUS
  Administered 2018-06-29 – 2018-06-30 (×4): 3 [IU] via SUBCUTANEOUS

## 2018-06-24 MED ORDER — SODIUM CHLORIDE 0.9 % IV SOLN
1.0000 g | INTRAVENOUS | Status: AC
  Administered 2018-06-24 – 2018-06-26 (×3): 1 g via INTRAVENOUS
  Filled 2018-06-24 (×3): qty 10

## 2018-06-24 MED ORDER — LIVING WELL WITH DIABETES BOOK: Freq: Once | Status: DC

## 2018-06-24 MED ORDER — SODIUM CHLORIDE 0.9 % IV SOLN
INTRAVENOUS | Status: DC | PRN
  Administered 2018-06-24 – 2018-06-25 (×2): 1000 mL via INTRAVENOUS

## 2018-06-24 MED ORDER — AZITHROMYCIN 500 MG PO TABS
500.0000 mg | ORAL_TABLET | Freq: Every day | ORAL | Status: AC
  Administered 2018-06-24 – 2018-06-26 (×3): 500 mg via ORAL
  Filled 2018-06-24 (×4): qty 1

## 2018-06-24 NOTE — Progress Notes (Signed)
RT NOTE: RT took patient off bipap and placed patient on HFNC salter at 8L. Patient tolerating well and says she feels much better compared to yesterday. RT will continue to monitor.

## 2018-06-24 NOTE — Progress Notes (Signed)
PROGRESS NOTE    Tanya [name]  BTD:[id number] DOB: 1945-07-20 DOA: 06/22/2018 PCP: System, Pcp Not In   Brief Narrative:  73 yo WF PMHx  HTN, DM type II uncontrolled with complication, LBBB, Acute Systolic CHF severe LEFT left AS  Presented to [facility] ED with 10/10 chest pain, beginning several days prior. Patient exhibited respiratory distress with tachypnea, and associated tachycardia to 140s, and was intubated in the ED. Prior to being intubated, there was no known history of nausea, vomiting, URI sx, but patient did present with some diaphoresis. Family has not been able to provide history over the phone. In ED, patient started on insulin gtt for glucose >400. Patient started on azithromycin and ceftriaxone for WBC 18 with CXR revealing bilateral opacities. Cardiology consulted on patient in ED given presenting chest pain. Bedside echo revealed LVEF approximately 50%. ECG reveals LBBB.  Initial labs reveal WBC 18 without lymphopenia. LDH 241. D-Dimer  0.87. BNP 447. Glucose 429. CXR reveals bilat opacities. Patient will be tested for COVID-19 and is a COVID rule-out.     Subjective:    Assessment & Plan:   Principal Problem:   Acute hypercapnic respiratory failure (HCC) Active Problems:   DKA (diabetic ketoacidoses) (HCC)   Elevated brain natriuretic peptide (BNP) level   Abnormal CXR   Pulmonary edema   Neutrophilic leukocytosis  Acute systolic CHF/Severe Aortic stenosis -Strict in and out -Daily weight - Cardiology to evaluate for cardiac catheterization possible aortic valve replacement -Patient states has new cardiologist unknown based weight - Furosemide 40 mg twice daily  Pulmonary hypertension - See CHF  Chest pain - Most likely secondary to acute systolic CHF and fluid overload -Treat underlying problem  Acute respiratory failure - Most likely secondary to acute systolic CHF - On admission patient was evaluated for COVID-19 rule out.  SARS coronavirus  negative -Titrate O2 to maintain SPO2> 92%  Septic shock - Patient off pressors - Cultures NGTD.  Given that patient will undergo cardiac catheterization and may undergo valve replacement continue antibiotics for now.     Diabetes type 2 uncontrolled with complication  -1/06 hemoglobin A1c = 8.5  - Levemir 14 units twice daily - Moderate SSI - Consult to diabetic coordinator - Consult to diabetic nutrition - Lipid panel pending  HLD    Hypomagnesmia -Magnesium goal> 2    Best practice:  Diet: NPO  Pain/Anxiety/Delirium protocol (if indicated): PRN fentanyl, PRN versed  VAP protocol (if indicated): yes DVT prophylaxis: lovenox  GI prophylaxis: protonix  Glucose control: insulin gtt  Mobility: bedrest Code Status: full  Family Communication: daughter updated Disposition: Admit to ICU     DVT prophylaxis: Lovenox Code Status: Full Family Communication: None Disposition Plan: Per cardiology   Consultants:  Cardiology    Procedures/Significant Events:  4/15 echocardiogram: EF t=40-45%.-Left ventricular diffuse hypokinesis. -Right ventricular systolic pressure is moderately elevated;~ 64.6 mmHg. Left Atrium/RIGHT atrium:  moderately dilated. Mitral Valve: Moderate mitral valve stenosis. Aortic valve:  regurgitation is moderate,  Severe stenosis of the aortic valve, with a calculated valve area of 0.61 cm. Pulmonary Artery: Pulmonary hypertension is moderate.    I have personally reviewed and interpreted all radiology studies and my findings are as above.  VENTILATOR SETTINGS: None   Cultures 4/14 respiratory virus panel negative 4/14 SARS coronavirus negative 4/14 blood RIGHT forearm NGTD 4/14 blood RIGHT hand NGTD 4/14 MRSA by PCR negative   Antimicrobials: Anti-infectives (From admission, onward)   Start     Stop   06/24/18  2000  cefTRIAXone (ROCEPHIN) 1 g in sodium chloride 0.9 % 100 mL IVPB         06/24/18 2000  azithromycin (ZITHROMAX)  tablet 500 mg         06/22/18 2000  cefTRIAXone (ROCEPHIN) 2 g in sodium chloride 0.9 % 100 mL IVPB  Status:  Discontinued     06/24/18 0734   06/22/18 2000  azithromycin (ZITHROMAX) 500 mg in sodium chloride 0.9 % 250 mL IVPB  Status:  Discontinued     06/24/18 0933       Devices   LINES / TUBES:     Continuous Infusions:  sodium chloride 150 mL/hr at 06/22/18 2141   sodium chloride 10 mL/hr at 06/24/18 0411   sodium chloride 10 mL/hr at 06/24/18 0600   azithromycin Stopped (06/23/18 2207)   cefTRIAXone (ROCEPHIN)  IV     norepinephrine (LEVOPHED) Adult infusion Stopped (06/23/18 0935)     Objective: Vitals:   06/24/18 0500 06/24/18 0600 06/24/18 0815 06/24/18 0829  BP: (!) 119/55 113/63 132/67   Pulse: 70 71 84   Resp: '19 17 19   ' Temp:    98.2 F (36.8 C)  TempSrc:    Axillary  SpO2: 100% 98% 99%   Weight:      Height:        Intake/Output Summary (Last 24 hours) at 06/24/2018 0837 Last data filed at 06/24/2018 0600 Gross per 24 hour  Intake 1224.57 ml  Output 1764 ml  Net -539.43 ml   Filed Weights   06/22/18 2300 06/23/18 0431 06/24/18 0409  Weight: 90.3 kg 90.4 kg 89.2 kg    Examination:  General: A/O x4, no acute respiratory distress Eyes: negative scleral hemorrhage, negative anisocoria, negative icterus ENT: Negative Runny nose, negative gingival bleeding, Neck:  Negative scars, masses, torticollis, lymphadenopathy, JVD Lungs: Clear to auscultation bilaterally without wheezes or crackles Cardiovascular: Regular rate and rhythm, positive holosystolic murmur 3/6, negative gallop  or rub normal S1 and S2 Abdomen: Obese, negative abdominal pain, nondistended, positive soft, bowel sounds, no rebound, no ascites, no appreciable mass Extremities: No significant cyanosis, clubbing, or edema bilateral lower extremities Skin: Negative rashes, lesions, ulcers Psychiatric:  Negative depression, negative anxiety, negative fatigue, negative mania   Central nervous system:  Cranial nerves II through XII intact, tongue/uvula midline, all extremities muscle strength 5/5, sensation intact throughout,  negative dysarthria, negative expressive aphasia, negative receptive aphasia.  .     Data Reviewed: Care during the described time interval was provided by me .  I have reviewed this patient's available data, including medical history, events of note, physical examination, and all test results as part of my evaluation.   CBC: Recent Labs  Lab 06/22/18 1905 06/22/18 2133 06/22/18 2343 06/23/18 0327 06/23/18 0438 06/24/18 0344  WBC 18.0*  --  14.1*  --  11.5* 9.9  NEUTROABS 12.2*  --   --   --   --   --   HGB 13.7 11.9* 11.8* 12.2 11.8* 12.5  HCT 44.4 35.0* 36.5 36.0 37.1 37.9  MCV 98.4  --  96.8  --  97.1 95.9  PLT 465*  --  320  --  315 924   Basic Metabolic Panel: Recent Labs  Lab 06/22/18 1859 06/22/18 1905 06/22/18 2133 06/22/18 2338 06/23/18 0327 06/23/18 0438 06/23/18 0946 06/24/18 0344  NA 134* 135 136 139 138  --  139 140  K 5.0 5.1 5.0 4.2 3.7  --  3.3* 3.8  CL  --  101  --  106  --   --  102 101  CO2  --  18*  --  24  --   --  27 27  GLUCOSE  --  429*  --  205*  --   --  179* 146*  BUN  --  22  --  22  --   --  15 15  CREATININE 0.80 1.00  --  0.76  --   --  0.68 0.69  CALCIUM  --  8.8*  --  7.8*  --   --  8.0* 8.3*  MG  --  1.7  --  1.4*  --  1.3*  --  1.4*  PHOS  --  7.6*  --  4.5  --  3.7  --  3.9   GFR: Estimated Creatinine Clearance: 63 mL/min (by C-G formula based on SCr of 0.69 mg/dL). Liver Function Tests: Recent Labs  Lab 06/22/18 1905  AST 48*  ALT 34  ALKPHOS 69  BILITOT 1.2  PROT 7.4  ALBUMIN 4.2   Recent Labs  Lab 06/22/18 1905  LIPASE 23   No results for input(s): AMMONIA in the last 168 hours. Coagulation Profile: Recent Labs  Lab 06/22/18 1935  INR 1.2   Cardiac Enzymes: Recent Labs  Lab 06/22/18 2338 06/23/18 0438 06/23/18 1425 06/24/18 0344  TROPONINI 0.21*  0.85* 0.54* 0.44*   BNP (last 3 results) No results for input(s): PROBNP in the last 8760 hours. HbA1C: Recent Labs    06/23/18 0930  HGBA1C 8.5*   CBG: Recent Labs  Lab 06/23/18 1937 06/23/18 2218 06/23/18 2341 06/24/18 0333 06/24/18 0756  GLUCAP 173* 209* 212* 141* 170*   Lipid Profile: No results for input(s): CHOL, HDL, LDLCALC, TRIG, CHOLHDL, LDLDIRECT in the last 72 hours. Thyroid Function Tests: Recent Labs    06/22/18 2343  TSH 0.871   Anemia Panel: Recent Labs    06/22/18 2343  FERRITIN 203   Urine analysis:    Component Value Date/Time   COLORURINE YELLOW 06/22/2018 2140   APPEARANCEUR HAZY (A) 06/22/2018 2140   LABSPEC 1.021 06/22/2018 2140   PHURINE 5.0 06/22/2018 2140   GLUCOSEU >=500 (A) 06/22/2018 2140   HGBUR NEGATIVE 06/22/2018 2140   [place] NEGATIVE 06/22/2018 2140   KETONESUR NEGATIVE 06/22/2018 2140   PROTEINUR 100 (A) 06/22/2018 2140   NITRITE NEGATIVE 06/22/2018 2140   LEUKOCYTESUR NEGATIVE 06/22/2018 2140   Sepsis Labs: '@LABRCNTIP' (procalcitonin:4,lacticidven:4)  ) Recent Results (from the past 240 hour(s))  Culture, blood (routine x 2)     Status: None (Preliminary result)   Collection Time: 06/22/18  7:47 PM  Result Value Ref Range Status   Specimen Description BLOOD RIGHT FOREARM  Final   Special Requests   Final    BOTTLES DRAWN AEROBIC AND ANAEROBIC Blood Culture results may not be optimal due to an inadequate volume of blood received in culture bottles   Culture   Final    NO GROWTH < 24 HOURS Performed at [facility], [place] [address], [place], [place] [address]    Report Status PENDING  Incomplete  Culture, blood (routine x 2)     Status: None (Preliminary result)   Collection Time: 06/22/18  7:49 PM  Result Value Ref Range Status   Specimen Description BLOOD RIGHT HAND  Final   Special Requests   Final    BOTTLES DRAWN AEROBIC ONLY Blood Culture results may not be optimal due to an excessive volume of blood  received  in culture bottles   Culture   Final    NO GROWTH < 24 HOURS Performed at [facility], [place] [address] [place], [place] [address]    Report Status PENDING  Incomplete  Respiratory Panel by PCR     Status: None   Collection Time: 06/22/18  8:40 PM  Result Value Ref Range Status   Adenovirus NOT DETECTED NOT DETECTED Final   Coronavirus 229E NOT DETECTED NOT DETECTED Final    Comment: (NOTE) The Coronavirus on the Respiratory Panel, DOES NOT test for the novel  Coronavirus (2019 nCoV)    Coronavirus HKU1 NOT DETECTED NOT DETECTED Final   Coronavirus NL63 NOT DETECTED NOT DETECTED Final   Coronavirus OC43 NOT DETECTED NOT DETECTED Final   Metapneumovirus NOT DETECTED NOT DETECTED Final   Rhinovirus / Enterovirus NOT DETECTED NOT DETECTED Final   Influenza A NOT DETECTED NOT DETECTED Final   Influenza B NOT DETECTED NOT DETECTED Final   Parainfluenza Virus 1 NOT DETECTED NOT DETECTED Final   Parainfluenza Virus 2 NOT DETECTED NOT DETECTED Final   Parainfluenza Virus 3 NOT DETECTED NOT DETECTED Final   Parainfluenza Virus 4 NOT DETECTED NOT DETECTED Final   Respiratory Syncytial Virus NOT DETECTED NOT DETECTED Final   Bordetella pertussis NOT DETECTED NOT DETECTED Final   Chlamydophila pneumoniae NOT DETECTED NOT DETECTED Final   Mycoplasma pneumoniae NOT DETECTED NOT DETECTED Final    Comment: Performed at [facility], [place]. [address] [place], [place] [address]  SARS Coronavirus 2 [facility] order, Performed in [facility] hospital lab)     Status: None   Collection Time: 06/22/18  8:40 PM  Result Value Ref Range Status   SARS Coronavirus 2 NEGATIVE NEGATIVE Final    Comment: (NOTE) If result is NEGATIVE SARS-CoV-2 target nucleic acids are NOT DETECTED. The SARS-CoV-2 RNA is generally detectable in upper and lower  respiratory specimens during the acute phase of infection. The lowest  concentration of SARS-CoV-2 viral copies this assay can detect is 250   copies / mL. A negative result does not preclude SARS-CoV-2 infection  and should not be used as the sole basis for treatment or other  patient management decisions.  A negative result may occur with  improper specimen collection / handling, submission of specimen other  than nasopharyngeal swab, presence of viral mutation(s) within the  areas targeted by this assay, and inadequate number of viral copies  (<250 copies / mL). A negative result must be combined with clinical  observations, patient history, and epidemiological information. If result is POSITIVE SARS-CoV-2 target nucleic acids are DETECTED. The SARS-CoV-2 RNA is generally detectable in upper and lower  respiratory specimens dur ing the acute phase of infection.  Positive  results are indicative of active infection with SARS-CoV-2.  Clinical  correlation with patient history and other diagnostic information is  necessary to determine patient infection status.  Positive results do  not rule out bacterial infection or co-infection with other viruses. If result is PRESUMPTIVE POSTIVE SARS-CoV-2 nucleic acids MAY BE PRESENT.   A presumptive positive result was obtained on the submitted specimen  and confirmed on repeat testing.  While 2019 novel coronavirus  (SARS-CoV-2) nucleic acids may be present in the submitted sample  additional confirmatory testing may be necessary for epidemiological  and / or clinical management purposes  to differentiate between  SARS-CoV-2 and other Sarbecovirus currently known to infect humans.  If clinically indicated additional testing with an alternate test  methodology (  [id number]) is advised. The SARS-CoV-2 RNA is generally  detectable in upper and lower respiratory sp ecimens during the acute  phase of infection. The expected result is Negative. Fact Sheet for Patients:  StrictlyIdeas.no Fact Sheet for Healthcare  Providers: BankingDealers.co.za This test is not yet approved or cleared by the [place] FDA and has been authorized for detection and/or diagnosis of SARS-CoV-2 by FDA under an Emergency Use Authorization (EUA).  This EUA will remain in effect (meaning this test can be used) for the duration of the COVID-19 declaration under Section 564(b)(1) of the Act, 21 U.S.C. section 360bbb-3(b)(1), unless the authorization is terminated or revoked sooner. Performed at [facility], [place] [address], [place], [place] [address]   MRSA PCR Screening     Status: None   Collection Time: 06/22/18 11:25 PM  Result Value Ref Range Status   MRSA by PCR NEGATIVE NEGATIVE Final    Comment:        The GeneXpert MRSA Assay (FDA approved for NASAL specimens only), is one component of a comprehensive MRSA colonization surveillance program. It is not intended to diagnose MRSA infection nor to guide or monitor treatment for MRSA infections. Performed at [facility], [place] [address] [place], [place] [address]          Radiology Studies: Dg Chest 1 View  Result Date: 06/23/2018 CLINICAL DATA:  Shortness of breath EXAM: CHEST  1 VIEW COMPARISON:  06/23/2018 FINDINGS: Bilateral interstitial and alveolar airspace opacities with more focal bilateral lower lobe airspace disease. Small bilateral pleural effusions. No pneumothorax. Stable cardiomegaly. Right jugular central venous catheter with the tip projecting over the SVC. No acute osseous abnormality. IMPRESSION: Bilateral lower lobe airspace disease concerning for pneumonia. Bilateral interstitial and alveolar airspace opacities with cardiomegaly likely reflecting pulmonary edema and/or associated multilobar pneumonia. Electronically Signed   By: Kathreen Devoid   On: 06/23/2018 15:59   Dg Chest Port 1 View  Result Date: 06/24/2018 CLINICAL DATA:  Acute respiratory failure EXAM: PORTABLE CHEST 1 VIEW COMPARISON:   06/23/2018 FINDINGS: Right jugular central venous catheter is stable. Normal heart size. Bilateral airspace disease and bilateral pleural effusions have improved. No pneumothorax. Left shoulder total arthroplasty. IMPRESSION: Improved bilateral airspace disease and bilateral pleural effusions. Electronically Signed   By: Marybelle Killings M.D.   On: 06/24/2018 08:20   Dg Chest Port 1 View  Result Date: 06/23/2018 CLINICAL DATA:  Acute hypercapnic respiratory failure. Endotracheal tube present. EXAM: PORTABLE CHEST 1 VIEW COMPARISON:  06/22/2018 FINDINGS: Endotracheal tube, right jugular central venous catheter, and orogastric tube remain in appropriate position. Stable mild cardiomegaly. Stable diffuse interstitial infiltrates, suspicious for pulmonary edema. Decreased lung volumes are seen, with increased atelectasis or consolidation in left retrocardiac lung base. Probable small bilateral pleural effusions, without change. IMPRESSION: 1. Decreased lung volumes with increased atelectasis or consolidation in left retrocardiac lung base. 2. Stable cardiomegaly and diffuse interstitial infiltrates. Probable small bilateral pleural effusions. Electronically Signed   By: Earle Gell M.D.   On: 06/23/2018 07:41   Dg Chest Portable 1 View  Result Date: 06/22/2018 CLINICAL DATA:  Status post central line placement EXAM: PORTABLE CHEST 1 VIEW COMPARISON:  06/22/2018 FINDINGS: Cardiac shadow remains enlarged. Endotracheal tube and nasogastric catheter are again noted and stable. New right jugular central line is noted at the cavoatrial junction. The degree of vascular congestion and asymmetric edema has improved in the interval from the prior exam. Right-sided pleural effusion is noted. No pneumothorax is seen. IMPRESSION:  Improved aeration in the right lung with decrease in the degree of edema. Right effusion is noted. Electronically Signed   By: Inez Catalina M.D.   On: 06/22/2018 23:35   Dg Chest Portable 1  View  Result Date: 06/22/2018 CLINICAL DATA:  Check endotracheal tube placement EXAM: PORTABLE CHEST 1 VIEW COMPARISON:  06/22/2018 FINDINGS: Cardiac shadow is stable. Endotracheal tube and gastric catheter are noted in satisfactory position. Stable changes of vascular congestion and asymmetric edema are again noted. No new focal abnormality is seen. Left shoulder replacement is noted. IMPRESSION: Increased vascular congestion and asymmetric edema right greater than left. Tubes and lines as described. Electronically Signed   By: Inez Catalina M.D.   On: 06/22/2018 20:16   Dg Chest Port 1 View  Result Date: 06/22/2018 CLINICAL DATA:  Chest pain and shortness of breath. EXAM: PORTABLE CHEST 1 VIEW COMPARISON:  04/27/2003. FINDINGS: 1850 hours. The cardio pericardial silhouette is enlarged. Diffuse interstitial and bibasilar airspace disease evident, right greater than left. Tiny bilateral pleural effusions. Bones are diffusely demineralized. Patient is status post left shoulder replacement. Telemetry leads overlie the chest. IMPRESSION: Cardiomegaly with interstitial and asymmetric airspace disease, right greater than left. Tiny bilateral pleural effusions. Imaging features suggest asymmetric pulmonary edema although diffuse infection not excluded. Electronically Signed   By: Misty Stanley M.D.   On: 06/22/2018 19:24        Scheduled Meds:  aspirin  81 mg Per Tube Daily   chlorhexidine gluconate (MEDLINE KIT)  15 mL Mouth Rinse BID   enoxaparin (LOVENOX) injection  40 mg Subcutaneous Q24H   furosemide  40 mg Intravenous Q12H   insulin aspart  0-15 Units Subcutaneous Q4H   insulin detemir  14 Units Subcutaneous BID   mouth rinse  15 mL Mouth Rinse 10 times per day   Continuous Infusions:  sodium chloride 150 mL/hr at 06/22/18 2141   sodium chloride 10 mL/hr at 06/24/18 0411   sodium chloride 10 mL/hr at 06/24/18 0600   azithromycin Stopped (06/23/18 2207)   cefTRIAXone (ROCEPHIN)   IV     norepinephrine (LEVOPHED) Adult infusion Stopped (06/23/18 0935)     LOS: 2 days   The patient is critically ill with multiple organ systems failure and requires high complexity decision making for assessment and support, frequent evaluation and titration of therapies, application of advanced monitoring technologies and extensive interpretation of multiple databases. Critical Care Time devoted to patient care services described in this note  Time spent: 40 minutes     Jatavis Malek, Geraldo Docker, MD [facility] Pager [phone]  If 7PM-7AM, please contact night-coverage [email or web address] Password [facility] 06/24/2018, 8:37 AM

## 2018-06-24 NOTE — H&P (View-Only) (Signed)
Progress Note  Patient Name: Tanya [name] Date of Encounter: 06/24/2018  Primary Cardiologist: No primary care provider on file.   Subjective   Feeling better today.  She is now off of BiPAP.  Breathing is improved with no shortness of breath at rest.  Chest discomfort has resolved.  Inpatient Medications    Scheduled Meds:  aspirin  81 mg Per Tube Daily   azithromycin  500 mg Oral Daily   chlorhexidine gluconate (MEDLINE KIT)  15 mL Mouth Rinse BID   enoxaparin (LOVENOX) injection  40 mg Subcutaneous Q24H   furosemide  40 mg Intravenous Q12H   insulin aspart  0-15 Units Subcutaneous Q4H   insulin detemir  14 Units Subcutaneous BID   mouth rinse  15 mL Mouth Rinse 10 times per day   Continuous Infusions:  sodium chloride 150 mL/hr at 06/22/18 2141   sodium chloride 10 mL/hr at 06/24/18 0411   sodium chloride 10 mL/hr at 06/24/18 0600   cefTRIAXone (ROCEPHIN)  IV     magnesium sulfate 1 - 4 g bolus IVPB 2 g (06/24/18 0946)   PRN Meds: sodium chloride, albuterol, dextrose, fentaNYL (SUBLIMAZE) injection, fentaNYL (SUBLIMAZE) injection, hydrALAZINE, midazolam, midazolam, nitroGLYCERIN   Vital Signs    Vitals:   06/24/18 0500 06/24/18 0600 06/24/18 0815 06/24/18 0829  BP: (!) 119/55 113/63 132/67   Pulse: 70 71 84   Resp: _0 Temp:    98.2 F (36.8 C)  TempSrc:    Axillary  SpO2: 100% 98% 99%   Weight:      Height:        Intake/Output Summary (Last 24 hours) at 06/24/2018 1004 Last data filed at 06/24/2018 0942 Gross per 24 hour  Intake 937.93 ml  Output 2814 ml  Net -1876.07 ml   Last 3 Weights 06/24/2018 06/23/2018 06/22/2018  Weight (lbs) 196 lb 10.4 oz 199 lb 4.7 oz 199 lb 1.2 oz  Weight (kg) 89.2 kg 90.4 kg 90.3 kg      Telemetry    Sinus rhythm - Personally Reviewed   Physical Exam  Alert, oriented, elderly woman in no distress on high flow oxygen at 8 L/min GEN: No acute distress.   Neck:  JVP difficult to visualize because of  body habitus Cardiac: RRR, 3/6 harsh late peaking systolic murmur at the right upper sternal border Respiratory:  Diffuse rhonchi GI: Soft, nontender, non-distended  MS: No edema; No deformity. Neuro:  Nonfocal  Psych: Normal affect   Labs    Chemistry Recent Labs  Lab 06/22/18 1905  06/22/18 2338 06/23/18 0327 06/23/18 0946 06/24/18 0344  NA 135   < > 139 138 139 140  K 5.1   < > 4.2 3.7 3.3* 3.8  CL 101  --  106  --  102 101  CO2 18*  --  24  --  27 27  GLUCOSE 429*  --  205*  --  179* 146*  BUN 22  --  22  --  15 15  CREATININE 1.00  --  0.76  --  0.68 0.69  CALCIUM 8.8*  --  7.8*  --  8.0* 8.3*  PROT 7.4  --   --   --   --   --   ALBUMIN 4.2  --   --   --   --   --   AST 48*  --   --   --   --   --   ALT  34  --   --   --   --   --   ALKPHOS 69  --   --   --   --   --   BILITOT 1.2  --   --   --   --   --   GFRNONAA 55*  --  >60  --  >60 >60  GFRAA >60  --  >60  --  >60 >60  ANIONGAP 16*  --  9  --  10 12   < > = values in this interval not displayed.     Hematology Recent Labs  Lab 06/22/18 2343 06/23/18 0327 06/23/18 0438 06/24/18 0344  WBC 14.1*  --  11.5* 9.9  RBC 3.77*  --  3.82* 3.95  HGB 11.8* 12.2 11.8* 12.5  HCT 36.5 36.0 37.1 37.9  MCV 96.8  --  97.1 95.9  MCH 31.3  --  30.9 31.6  MCHC 32.3  --  31.8 33.0  RDW 14.4  --  14.4 14.3  PLT 320  --  315 300    Cardiac Enzymes Recent Labs  Lab 06/22/18 2338 06/23/18 0438 06/23/18 1425 06/24/18 0344  TROPONINI 0.21* 0.85* 0.54* 0.44*    Recent Labs  Lab 06/22/18 1857  TROPIPOC 0.02     BNP Recent Labs  Lab 06/22/18 1905  BNP 447.3*     DDimer  Recent Labs  Lab 06/22/18 1935  DDIMER 0.87*     Radiology    Dg Chest 1 View  Result Date: 06/23/2018 CLINICAL DATA:  Shortness of breath EXAM: CHEST  1 VIEW COMPARISON:  06/23/2018 FINDINGS: Bilateral interstitial and alveolar airspace opacities with more focal bilateral lower lobe airspace disease. Small bilateral pleural  effusions. No pneumothorax. Stable cardiomegaly. Right jugular central venous catheter with the tip projecting over the SVC. No acute osseous abnormality. IMPRESSION: Bilateral lower lobe airspace disease concerning for pneumonia. Bilateral interstitial and alveolar airspace opacities with cardiomegaly likely reflecting pulmonary edema and/or associated multilobar pneumonia. Electronically Signed   By: Kathreen Devoid   On: 06/23/2018 15:59   Dg Chest Port 1 View  Result Date: 06/24/2018 CLINICAL DATA:  Acute respiratory failure EXAM: PORTABLE CHEST 1 VIEW COMPARISON:  06/23/2018 FINDINGS: Right jugular central venous catheter is stable. Normal heart size. Bilateral airspace disease and bilateral pleural effusions have improved. No pneumothorax. Left shoulder total arthroplasty. IMPRESSION: Improved bilateral airspace disease and bilateral pleural effusions. Electronically Signed   By: Marybelle Killings M.D.   On: 06/24/2018 08:20   Dg Chest Port 1 View  Result Date: 06/23/2018 CLINICAL DATA:  Acute hypercapnic respiratory failure. Endotracheal tube present. EXAM: PORTABLE CHEST 1 VIEW COMPARISON:  06/22/2018 FINDINGS: Endotracheal tube, right jugular central venous catheter, and orogastric tube remain in appropriate position. Stable mild cardiomegaly. Stable diffuse interstitial infiltrates, suspicious for pulmonary edema. Decreased lung volumes are seen, with increased atelectasis or consolidation in left retrocardiac lung base. Probable small bilateral pleural effusions, without change. IMPRESSION: 1. Decreased lung volumes with increased atelectasis or consolidation in left retrocardiac lung base. 2. Stable cardiomegaly and diffuse interstitial infiltrates. Probable small bilateral pleural effusions. Electronically Signed   By: Earle Gell M.D.   On: 06/23/2018 07:41   Dg Chest Portable 1 View  Result Date: 06/22/2018 CLINICAL DATA:  Status post central line placement EXAM: PORTABLE CHEST 1 VIEW COMPARISON:   06/22/2018 FINDINGS: Cardiac shadow remains enlarged. Endotracheal tube and nasogastric catheter are again noted and stable. New right jugular central line is noted at the  cavoatrial junction. The degree of vascular congestion and asymmetric edema has improved in the interval from the prior exam. Right-sided pleural effusion is noted. No pneumothorax is seen. IMPRESSION: Improved aeration in the right lung with decrease in the degree of edema. Right effusion is noted. Electronically Signed   By: Inez Catalina M.D.   On: 06/22/2018 23:35   Dg Chest Portable 1 View  Result Date: 06/22/2018 CLINICAL DATA:  Check endotracheal tube placement EXAM: PORTABLE CHEST 1 VIEW COMPARISON:  06/22/2018 FINDINGS: Cardiac shadow is stable. Endotracheal tube and gastric catheter are noted in satisfactory position. Stable changes of vascular congestion and asymmetric edema are again noted. No new focal abnormality is seen. Left shoulder replacement is noted. IMPRESSION: Increased vascular congestion and asymmetric edema right greater than left. Tubes and lines as described. Electronically Signed   By: Inez Catalina M.D.   On: 06/22/2018 20:16   Dg Chest Port 1 View  Result Date: 06/22/2018 CLINICAL DATA:  Chest pain and shortness of breath. EXAM: PORTABLE CHEST 1 VIEW COMPARISON:  04/27/2003. FINDINGS: 1850 hours. The cardio pericardial silhouette is enlarged. Diffuse interstitial and bibasilar airspace disease evident, right greater than left. Tiny bilateral pleural effusions. Bones are diffusely demineralized. Patient is status post left shoulder replacement. Telemetry leads overlie the chest. IMPRESSION: Cardiomegaly with interstitial and asymmetric airspace disease, right greater than left. Tiny bilateral pleural effusions. Imaging features suggest asymmetric pulmonary edema although diffuse infection not excluded. Electronically Signed   By: Misty Stanley M.D.   On: 06/22/2018 19:24    Cardiac Studies   TTE:  06/23/18  1. The left ventricle has mild-moderately reduced systolic function, with an ejection fraction of 40-45%. The cavity size was normal. Left ventricular diastolic function could not be evaluated. Left ventricular diffuse hypokinesis. 2. The right ventricle has normal systolic function. The cavity was normal. There is no increase in right ventricular wall thickness. Right ventricular systolic pressure is moderately elevated with an estimated pressure of 64.6 mmHg. 3. Left atrial size was moderately dilated. 4. Right atrial size was moderately dilated. 5. The mitral valve is degenerative. Mild thickening of the mitral valve leaflet. There is moderate mitral annular calcification present. Mitral valve regurgitation is moderate by color flow Doppler. Moderate mitral valve stenosis. 6. The aortic valve is tricuspid. Severely thickening of the aortic valve. Severe calcifcation of the aortic valve. Aortic valve regurgitation is moderate by color flow Doppler. Severe stenosis of the aortic valve. 7. Pulmonary hypertension is moderate.  LEFT VENTRICLE PLAX 2D LVIDd: 5.00 cm LVIDs: 3.80 cm LV PW: 1.10 cm LV IVS: 1.00 cm LVOT diam: 1.70 cm LV SV: 56 ml LV SV Index: 27.70 LVOT Area: 2.27 cm  LV Volumes (MOD) LV area d, A2C: 24.00 cm LV area d, A4C: 30.20 cm LV area s, A2C: 12.40 cm LV area s, A4C: 20.70 cm LV major d, A2C: 6.30 cm LV major d, A4C: 7.21 cm LV major s, A2C: 5.05 cm LV major s, A4C: 6.55 cm LV vol d, MOD A2C: 70.4 ml LV vol d, MOD A4C: 102.0 ml LV vol s, MOD A2C: 29.9 ml LV vol s, MOD A4C: 59.5 ml LV SV MOD A2C: 40.5 ml LV SV MOD A4C: 102.0 ml LV SV MOD BP: 43.4 ml  RIGHT VENTRICLE RV S prime: 15.90 cm/s RVOT diam: 3.65 cm TAPSE (M-mode): 2.9 cm  LEFT ATRIUM Index RIGHT ATRIUM Index LA diam: 4.60 cm 2.41 cm/m RA Area:  15.20 cm LA Vol (A2C): 73.0 ml 38.24 ml/m RA  Volume: 38.30 ml 20.06 ml/m LA Vol (A4C): 77.4 ml 40.55 ml/m LA Biplane Vol: 82.3 ml 43.12 ml/m AORTIC VALVE PULMONIC VALVE AV Area (Vmax): 0.64 cm PV Vmax: 1.34 m/s AV Area (Vmean): 0.64 cm PV Peak grad: 7.2 mmHg AV Area (VTI): 0.61 cm AV Vmax: 417.00 cm/s AV Vmean: 297.000 cm/s AV VTI: 0.746 m AV Peak Grad: 69.6 mmHg AV Mean Grad: 40.0 mmHg LVOT Vmax: 117.00 cm/s LVOT Vmean: 84.000 cm/s LVOT VTI: 0.200 m LVOT/AV VTI ratio: 0.27  AORTA Ao Root diam: 2.60 cm Ao Asc diam: 3.30 cm  MITRAL VALVE TRICUSPID VALVE MV Area (PHT): 6.71 cm TR Peak grad: 61.6 mmHg MV Peak grad: 17.8 mmHg TR Vmax: 409.00 cm/s MV Mean grad: 11.0 mmHg MV Vmax: 2.11 m/s SHUNTS MV Vmean: 153.5 cm/s Systemic VTI: 0.20 m MV VTI: 0.33 m Systemic Diam: 1.70 cm MV PHT: 32.77 msec Pulmonic Diam: 3.65 cm MV Decel Time: 113 msec MV E velocity: 208.00 cm/s  Patient Profile     73 y.o. 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 Acute on Chronic combined HF: Transitioned off Bipap to HFNC this morning. Tolerating per RT notes. Net - 1.8L and weight trending down. Continue with IV diuresis. Hopeful for [facility] for further evaluation of of AS and possible TAVR candidate.   2. Severe AS: noted on echo yesterday. Planned for further work up this admission pending her respiratory status.  3. Moderate mitral stenosis with moderate mitral regurg  4. Elevated troponin: peak of 0.85. Possibly  related to demand ischemia, though further ischemic eval warranted pending respiratory improvement and determination of functional capacity.  For questions or updates, please contact [place] Please consult [email or web address] for contact info under     Signed, Reino Bellis, NP  06/24/2018, 10:04 AM    Patient seen, examined. Available data reviewed. Agree with findings, assessment, and plan as outlined by Reino Bellis, NP-C.  My physical exam findings are reflected in the documentation above.  The patient looks much better today.  She is able to provide many more details of her medical history.  She has lived alone but was in the process of moving in with her daughter.  The patient has been on 3 L of home oxygen now for several years.  She reports progressive symptoms of exertional chest tightness and shortness of breath over an extended period of time now of about 2 years.  She has been much worse over the last 3 to 4 months.  She is symptomatic with almost any activity at all.  She has been followed for aortic stenosis with serial exams and echo studies based on her report.  She recently has been diagnosed with severe aortic stenosis and was in discussion with her cardiologist about proceeding with a heart catheterization and further testing.  I have personally reviewed the patient's echo study.  The patient clearly has severe calcific aortic stenosis with a mean transvalvular gradient of approximately 40 mmHg.  She has a small LV outflow tract and her calculated aortic valve area 0.6 cm.  She also has calcification of the mitral annulus and mitral valve leaflets with at least moderate mitral stenosis.  Her mean transmitral gradient is 10 to 12 mmHg.  We have discussed  treatment options at length.  I do not think there is any question that her valvular heart disease is driving her symptoms, now with New York Heart Association functional class III/IV heart failure.  She is now been intubated on 2  separate occasions in the context of atrial fibrillation. I have reviewed the natural history of aortic stenosis with the patient today. We have discussed the limitations of medical therapy and the poor prognosis associated with symptomatic aortic stenosis. We have reviewed potential treatment options, including palliative medical therapy, conventional surgical aortic valve replacement, and transcatheter aortic valve replacement. We discussed treatment options in the context of the patient's specific comorbid medical conditions.  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  However, TAVR might be a reasonable consideration.  I have recommended a right and left heart catheterization once her respiratory status will allow.  We will continue to diurese her with IV Lasix.  We will tentatively plan a right and left heart catheterization tomorrow.  I also had a lengthy discussion with the patient's daughter over the telephone.  All of her care has been in the [facility] system in the past.  They are discussing whether they would like to proceed with treatment here at [facility], or consider transfer to [facility].  They will let us know when they make a decision.  Sherren Mocha, M.D. 06/24/2018 10:58 AM

## 2018-06-24 NOTE — Progress Notes (Addendum)
Inpatient Diabetes Program Recommendations  AACE/ADA: New Consensus Statement on Inpatient Glycemic Control (2015)  Target Ranges:  Prepandial:   less than 140 mg/dL      Peak postprandial:   less than 180 mg/dL (1-2 hours)      Critically ill patients:  140 - 180 mg/dL    Results for Tanya [name], Tanya [name] (MRN [id number]) as of 06/24/2018 13:38  Ref. Range 06/23/2018 22:18 06/23/2018 23:41 06/24/2018 03:33 06/24/2018 07:56 06/24/2018 11:11  Glucose-Capillary Latest Ref Range: 70 - 99 mg/dL 209 (H) 212 (H) 141 (H) 170 (H) 158 (H)     Review of Glycemic Control  Diabetes history: DM 2  Outpatient Diabetes medications: Home meds verification per pharmacy still in process; patient stated to me she was on Metformin 1000 mg BID for past year  Current orders for Inpatient glycemic control: Novolog moderate correction (0-15 units) tid and (0-5 units) hs                                                                          Levemir 14 units BID  Patient admitted with DKA and labs have improved this am (CO 27 and AG 12). Lab glucose on arrival was 429.   Hgb A1c 8.5% (average of 195 mg/dl over the past 2-3 months). Diabetic coordinator working remotely and called patient on phone. Informed her of CBG on admission and briefly explained DKA process. She stated she had been "feeling bad for a long time" and "wasn't eating right". She sees Dr. Arelia Sneddon at [facility] (near [facility]) and that is who manages her diabetes care. She stated she most recently had been on Metformin 1000 mg BID but was still having blood sugars in the 200-300's. She stated she was trying to get an appointment to see MD about her diabetes but had to be delayed because of current COVID-19.   Explained what an A1c is and what it measures. Reminded patient that  goal A1c is 7% or less per ADA standards to prevent both acute and long-term complications. Explained to patient the extreme importance of good glucose control  at home. She states she checks her CBG 1-2 times a day. Encouraged patient record all CBGs in a logbook for PCP to review and next appt. Encouraged patient to make a f/u appt with MD as soon as able post discharge  Patient stated she plans to ask her PCP if she can see a specialist for her diabetes at her next appt.   Briefly discussed food and drink choices. Mostly drinks water and unsweetened drinks. As far as food she stated she was not eating "how she should have" recently as just hadn't felt well. Will order "living well with diabetes" book for patient for her review.   Patient now has diet ordered. On SQ insulin and CBG within target range. Patient has not been on insulin before (at home). Will continue to monitor glucose trends.    Jonna Clark RN, MSN Diabetes Coordinator Inpatient Glycemic Control Team Team Pager: [phone] (8am-5pm)

## 2018-06-24 NOTE — Progress Notes (Signed)
   06/24/18 1000  Clinical Encounter Type  Visited With Patient  Visit Type Initial;Spiritual support  Referral From Nurse  Consult/Referral To Chaplain  Spiritual Encounters  Spiritual Needs Prayer  Stress Factors  Patient Stress Factors Major life changes  The chaplain responded to Pt. request for spiritual care. This chaplain introduced herself to the Pt. RN-Kristie.  The Pt. greeted the chaplain with a smile and the Pt. story of her personal faith in God.  The chaplain was pastorally present and heard the Pt.'s positive reflections on her supportive family.  The Pt. primary contact person is her daughter-Sharon. The Pt. accepted the invitation for prayer today and requested prayer before tomorrow's heart cath.  The chaplain is available for F/U spiritual care as needed.

## 2018-06-24 NOTE — Interval H&P Note (Signed)
Cath Lab Visit (complete for each Cath Lab visit)  Clinical Evaluation Leading to the Procedure:   ACS: Yes.    Non-ACS:    Anginal Classification: CCS III  Anti-ischemic medical therapy: Minimal Therapy (1 class of medications)  Non-Invasive Test Results: High-risk stress test findings: cardiac mortality >3%/year  Prior CABG: No previous CABG      History and Physical Interval Note:  06/24/2018 10:07 PM  Tanya [name]  has presented today for surgery, with the diagnosis of heart failure.  The various methods of treatment have been discussed with the patient and family. After consideration of risks, benefits and other options for treatment, the patient has consented to  Procedure(s): RIGHT/LEFT HEART CATH AND CORONARY ANGIOGRAPHY (N/A) as a surgical intervention.  The patient's history has been reviewed, patient examined, no change in status, stable for surgery.  I have reviewed the patient's chart and labs.  Questions were answered to the patient's satisfaction.     Belva Crome III

## 2018-06-24 NOTE — Progress Notes (Addendum)
Progress Note  Patient Name: Tanya [name] Date of Encounter: 06/24/2018  Primary Cardiologist: No primary care provider on file.   Subjective   Feeling better today.  She is now off of BiPAP.  Breathing is improved with no shortness of breath at rest.  Chest discomfort has resolved.  Inpatient Medications    Scheduled Meds:  aspirin  81 mg Per Tube Daily   azithromycin  500 mg Oral Daily   chlorhexidine gluconate (MEDLINE KIT)  15 mL Mouth Rinse BID   enoxaparin (LOVENOX) injection  40 mg Subcutaneous Q24H   furosemide  40 mg Intravenous Q12H   insulin aspart  0-15 Units Subcutaneous Q4H   insulin detemir  14 Units Subcutaneous BID   mouth rinse  15 mL Mouth Rinse 10 times per day   Continuous Infusions:  sodium chloride 150 mL/hr at 06/22/18 2141   sodium chloride 10 mL/hr at 06/24/18 0411   sodium chloride 10 mL/hr at 06/24/18 0600   cefTRIAXone (ROCEPHIN)  IV     magnesium sulfate 1 - 4 g bolus IVPB 2 g (06/24/18 0946)   PRN Meds: sodium chloride, albuterol, dextrose, fentaNYL (SUBLIMAZE) injection, fentaNYL (SUBLIMAZE) injection, hydrALAZINE, midazolam, midazolam, nitroGLYCERIN   Vital Signs    Vitals:   06/24/18 0500 06/24/18 0600 06/24/18 0815 06/24/18 0829  BP: (!) 119/55 113/63 132/67   Pulse: 70 71 84   Resp: _0 Temp:    98.2 F (36.8 C)  TempSrc:    Axillary  SpO2: 100% 98% 99%   Weight:      Height:        Intake/Output Summary (Last 24 hours) at 06/24/2018 1004 Last data filed at 06/24/2018 0942 Gross per 24 hour  Intake 937.93 ml  Output 2814 ml  Net -1876.07 ml   Last 3 Weights 06/24/2018 06/23/2018 06/22/2018  Weight (lbs) 196 lb 10.4 oz 199 lb 4.7 oz 199 lb 1.2 oz  Weight (kg) 89.2 kg 90.4 kg 90.3 kg      Telemetry    Sinus rhythm - Personally Reviewed   Physical Exam  Alert, oriented, elderly woman in no distress on high flow oxygen at 8 L/min GEN: No acute distress.   Neck:  JVP difficult to visualize because of  body habitus Cardiac: RRR, 3/6 harsh late peaking systolic murmur at the right upper sternal border Respiratory:  Diffuse rhonchi GI: Soft, nontender, non-distended  MS: No edema; No deformity. Neuro:  Nonfocal  Psych: Normal affect   Labs    Chemistry Recent Labs  Lab 06/22/18 1905  06/22/18 2338 06/23/18 0327 06/23/18 0946 06/24/18 0344  NA 135   < > 139 138 139 140  K 5.1   < > 4.2 3.7 3.3* 3.8  CL 101  --  106  --  102 101  CO2 18*  --  24  --  27 27  GLUCOSE 429*  --  205*  --  179* 146*  BUN 22  --  22  --  15 15  CREATININE 1.00  --  0.76  --  0.68 0.69  CALCIUM 8.8*  --  7.8*  --  8.0* 8.3*  PROT 7.4  --   --   --   --   --   ALBUMIN 4.2  --   --   --   --   --   AST 48*  --   --   --   --   --   ALT  34  --   --   --   --   --   ALKPHOS 69  --   --   --   --   --   BILITOT 1.2  --   --   --   --   --   GFRNONAA 55*  --  >60  --  >60 >60  GFRAA >60  --  >60  --  >60 >60  ANIONGAP 16*  --  9  --  10 12   < > = values in this interval not displayed.     Hematology Recent Labs  Lab 06/22/18 2343 06/23/18 0327 06/23/18 0438 06/24/18 0344  WBC 14.1*  --  11.5* 9.9  RBC 3.77*  --  3.82* 3.95  HGB 11.8* 12.2 11.8* 12.5  HCT 36.5 36.0 37.1 37.9  MCV 96.8  --  97.1 95.9  MCH 31.3  --  30.9 31.6  MCHC 32.3  --  31.8 33.0  RDW 14.4  --  14.4 14.3  PLT 320  --  315 300    Cardiac Enzymes Recent Labs  Lab 06/22/18 2338 06/23/18 0438 06/23/18 1425 06/24/18 0344  TROPONINI 0.21* 0.85* 0.54* 0.44*    Recent Labs  Lab 06/22/18 1857  TROPIPOC 0.02     BNP Recent Labs  Lab 06/22/18 1905  BNP 447.3*     DDimer  Recent Labs  Lab 06/22/18 1935  DDIMER 0.87*     Radiology    Dg Chest 1 View  Result Date: 06/23/2018 CLINICAL DATA:  Shortness of breath EXAM: CHEST  1 VIEW COMPARISON:  06/23/2018 FINDINGS: Bilateral interstitial and alveolar airspace opacities with more focal bilateral lower lobe airspace disease. Small bilateral pleural  effusions. No pneumothorax. Stable cardiomegaly. Right jugular central venous catheter with the tip projecting over the SVC. No acute osseous abnormality. IMPRESSION: Bilateral lower lobe airspace disease concerning for pneumonia. Bilateral interstitial and alveolar airspace opacities with cardiomegaly likely reflecting pulmonary edema and/or associated multilobar pneumonia. Electronically Signed   By: Kathreen Devoid   On: 06/23/2018 15:59   Dg Chest Port 1 View  Result Date: 06/24/2018 CLINICAL DATA:  Acute respiratory failure EXAM: PORTABLE CHEST 1 VIEW COMPARISON:  06/23/2018 FINDINGS: Right jugular central venous catheter is stable. Normal heart size. Bilateral airspace disease and bilateral pleural effusions have improved. No pneumothorax. Left shoulder total arthroplasty. IMPRESSION: Improved bilateral airspace disease and bilateral pleural effusions. Electronically Signed   By: Marybelle Killings M.D.   On: 06/24/2018 08:20   Dg Chest Port 1 View  Result Date: 06/23/2018 CLINICAL DATA:  Acute hypercapnic respiratory failure. Endotracheal tube present. EXAM: PORTABLE CHEST 1 VIEW COMPARISON:  06/22/2018 FINDINGS: Endotracheal tube, right jugular central venous catheter, and orogastric tube remain in appropriate position. Stable mild cardiomegaly. Stable diffuse interstitial infiltrates, suspicious for pulmonary edema. Decreased lung volumes are seen, with increased atelectasis or consolidation in left retrocardiac lung base. Probable small bilateral pleural effusions, without change. IMPRESSION: 1. Decreased lung volumes with increased atelectasis or consolidation in left retrocardiac lung base. 2. Stable cardiomegaly and diffuse interstitial infiltrates. Probable small bilateral pleural effusions. Electronically Signed   By: Earle Gell M.D.   On: 06/23/2018 07:41   Dg Chest Portable 1 View  Result Date: 06/22/2018 CLINICAL DATA:  Status post central line placement EXAM: PORTABLE CHEST 1 VIEW COMPARISON:   06/22/2018 FINDINGS: Cardiac shadow remains enlarged. Endotracheal tube and nasogastric catheter are again noted and stable. New right jugular central line is noted at the  cavoatrial junction. The degree of vascular congestion and asymmetric edema has improved in the interval from the prior exam. Right-sided pleural effusion is noted. No pneumothorax is seen. IMPRESSION: Improved aeration in the right lung with decrease in the degree of edema. Right effusion is noted. Electronically Signed   By: Inez Catalina M.D.   On: 06/22/2018 23:35   Dg Chest Portable 1 View  Result Date: 06/22/2018 CLINICAL DATA:  Check endotracheal tube placement EXAM: PORTABLE CHEST 1 VIEW COMPARISON:  06/22/2018 FINDINGS: Cardiac shadow is stable. Endotracheal tube and gastric catheter are noted in satisfactory position. Stable changes of vascular congestion and asymmetric edema are again noted. No new focal abnormality is seen. Left shoulder replacement is noted. IMPRESSION: Increased vascular congestion and asymmetric edema right greater than left. Tubes and lines as described. Electronically Signed   By: Inez Catalina M.D.   On: 06/22/2018 20:16   Dg Chest Port 1 View  Result Date: 06/22/2018 CLINICAL DATA:  Chest pain and shortness of breath. EXAM: PORTABLE CHEST 1 VIEW COMPARISON:  04/27/2003. FINDINGS: 1850 hours. The cardio pericardial silhouette is enlarged. Diffuse interstitial and bibasilar airspace disease evident, right greater than left. Tiny bilateral pleural effusions. Bones are diffusely demineralized. Patient is status post left shoulder replacement. Telemetry leads overlie the chest. IMPRESSION: Cardiomegaly with interstitial and asymmetric airspace disease, right greater than left. Tiny bilateral pleural effusions. Imaging features suggest asymmetric pulmonary edema although diffuse infection not excluded. Electronically Signed   By: Misty Stanley M.D.   On: 06/22/2018 19:24    Cardiac Studies   TTE:  06/23/18  1. The left ventricle has mild-moderately reduced systolic function, with an ejection fraction of 40-45%. The cavity size was normal. Left ventricular diastolic function could not be evaluated. Left ventricular diffuse hypokinesis. 2. The right ventricle has normal systolic function. The cavity was normal. There is no increase in right ventricular wall thickness. Right ventricular systolic pressure is moderately elevated with an estimated pressure of 64.6 mmHg. 3. Left atrial size was moderately dilated. 4. Right atrial size was moderately dilated. 5. The mitral valve is degenerative. Mild thickening of the mitral valve leaflet. There is moderate mitral annular calcification present. Mitral valve regurgitation is moderate by color flow Doppler. Moderate mitral valve stenosis. 6. The aortic valve is tricuspid. Severely thickening of the aortic valve. Severe calcifcation of the aortic valve. Aortic valve regurgitation is moderate by color flow Doppler. Severe stenosis of the aortic valve. 7. Pulmonary hypertension is moderate.  LEFT VENTRICLE PLAX 2D LVIDd: 5.00 cm LVIDs: 3.80 cm LV PW: 1.10 cm LV IVS: 1.00 cm LVOT diam: 1.70 cm LV SV: 56 ml LV SV Index: 27.70 LVOT Area: 2.27 cm  LV Volumes (MOD) LV area d, A2C: 24.00 cm LV area d, A4C: 30.20 cm LV area s, A2C: 12.40 cm LV area s, A4C: 20.70 cm LV major d, A2C: 6.30 cm LV major d, A4C: 7.21 cm LV major s, A2C: 5.05 cm LV major s, A4C: 6.55 cm LV vol d, MOD A2C: 70.4 ml LV vol d, MOD A4C: 102.0 ml LV vol s, MOD A2C: 29.9 ml LV vol s, MOD A4C: 59.5 ml LV SV MOD A2C: 40.5 ml LV SV MOD A4C: 102.0 ml LV SV MOD BP: 43.4 ml  RIGHT VENTRICLE RV S prime: 15.90 cm/s RVOT diam: 3.65 cm TAPSE (M-mode): 2.9 cm  LEFT ATRIUM Index RIGHT ATRIUM Index LA diam: 4.60 cm 2.41 cm/m RA Area:  15.20 cm LA Vol (A2C): 73.0 ml 38.24 ml/m RA  Volume: 38.30 ml 20.06 ml/m LA Vol (A4C): 77.4 ml 40.55 ml/m LA Biplane Vol: 82.3 ml 43.12 ml/m AORTIC VALVE PULMONIC VALVE AV Area (Vmax): 0.64 cm PV Vmax: 1.34 m/s AV Area (Vmean): 0.64 cm PV Peak grad: 7.2 mmHg AV Area (VTI): 0.61 cm AV Vmax: 417.00 cm/s AV Vmean: 297.000 cm/s AV VTI: 0.746 m AV Peak Grad: 69.6 mmHg AV Mean Grad: 40.0 mmHg LVOT Vmax: 117.00 cm/s LVOT Vmean: 84.000 cm/s LVOT VTI: 0.200 m LVOT/AV VTI ratio: 0.27  AORTA Ao Root diam: 2.60 cm Ao Asc diam: 3.30 cm  MITRAL VALVE TRICUSPID VALVE MV Area (PHT): 6.71 cm TR Peak grad: 61.6 mmHg MV Peak grad: 17.8 mmHg TR Vmax: 409.00 cm/s MV Mean grad: 11.0 mmHg MV Vmax: 2.11 m/s SHUNTS MV Vmean: 153.5 cm/s Systemic VTI: 0.20 m MV VTI: 0.33 m Systemic Diam: 1.70 cm MV PHT: 32.77 msec Pulmonic Diam: 3.65 cm MV Decel Time: 113 msec MV E velocity: 208.00 cm/s  Patient Profile     73 y.o. 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 Acute on Chronic combined HF: Transitioned off Bipap to HFNC this morning. Tolerating per RT notes. Net - 1.8L and weight trending down. Continue with IV diuresis. Hopeful for [facility] for further evaluation of of AS and possible TAVR candidate.   2. Severe AS: noted on echo yesterday. Planned for further work up this admission pending her respiratory status.  3. Moderate mitral stenosis with moderate mitral regurg  4. Elevated troponin: peak of 0.85. Possibly  related to demand ischemia, though further ischemic eval warranted pending respiratory improvement and determination of functional capacity.  For questions or updates, please contact [place] Please consult [email or web address] for contact info under     Signed, Reino Bellis, NP  06/24/2018, 10:04 AM    Patient seen, examined. Available data reviewed. Agree with findings, assessment, and plan as outlined by Reino Bellis, NP-C.  My physical exam findings are reflected in the documentation above.  The patient looks much better today.  She is able to provide many more details of her medical history.  She has lived alone but was in the process of moving in with her daughter.  The patient has been on 3 L of home oxygen now for several years.  She reports progressive symptoms of exertional chest tightness and shortness of breath over an extended period of time now of about 2 years.  She has been much worse over the last 3 to 4 months.  She is symptomatic with almost any activity at all.  She has been followed for aortic stenosis with serial exams and echo studies based on her report.  She recently has been diagnosed with severe aortic stenosis and was in discussion with her cardiologist about proceeding with a heart catheterization and further testing.  I have personally reviewed the patient's echo study.  The patient clearly has severe calcific aortic stenosis with a mean transvalvular gradient of approximately 40 mmHg.  She has a small LV outflow tract and her calculated aortic valve area 0.6 cm.  She also has calcification of the mitral annulus and mitral valve leaflets with at least moderate mitral stenosis.  Her mean transmitral gradient is 10 to 12 mmHg.  We have discussed  treatment options at length.  I do not think there is any question that her valvular heart disease is driving her symptoms, now with New [employer] Association functional class III/IV heart failure.  She is now been intubated on 2  separate occasions in the context of atrial fibrillation. I have reviewed the natural history of aortic stenosis with the patient today. We have discussed the limitations of medical therapy and the poor prognosis associated with symptomatic aortic stenosis. We have reviewed potential treatment options, including palliative medical therapy, conventional surgical aortic valve replacement, and transcatheter aortic valve replacement. We discussed treatment options in the context of the patient's specific comorbid medical conditions.  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  However, TAVR might be a reasonable consideration.  I have recommended a right and left heart catheterization once her respiratory status will allow.  We will continue to diurese her with IV Lasix.  We will tentatively plan a right and left heart catheterization tomorrow.  I also had a lengthy discussion with the patient's daughter over the telephone.  All of her care has been in the [facility] system in the past.  They are discussing whether they would like to proceed with treatment here at [facility], or consider transfer to [facility].  They will let us know when they make a decision.  Sherren Mocha, M.D. 06/24/2018 10:58 AM

## 2018-06-25 ENCOUNTER — Encounter (HOSPITAL_COMMUNITY)
Admission: EM | Disposition: A | Discharge status: Home / Self Care | Payer: Self-pay | Source: Home / Self Care | Attending: Student

## 2018-06-25 ENCOUNTER — Encounter (HOSPITAL_COMMUNITY): Payer: Self-pay | Admitting: Physician Assistant

## 2018-06-25 DIAGNOSIS — M109 Gout, unspecified: Secondary | ICD-10-CM | POA: Insufficient documentation

## 2018-06-25 DIAGNOSIS — I10 Essential (primary) hypertension: Secondary | ICD-10-CM | POA: Diagnosis present

## 2018-06-25 DIAGNOSIS — G4733 Obstructive sleep apnea (adult) (pediatric): Secondary | ICD-10-CM

## 2018-06-25 DIAGNOSIS — I890 Lymphedema, not elsewhere classified: Secondary | ICD-10-CM

## 2018-06-25 DIAGNOSIS — J45909 Unspecified asthma, uncomplicated: Secondary | ICD-10-CM | POA: Diagnosis present

## 2018-06-25 DIAGNOSIS — I480 Paroxysmal atrial fibrillation: Secondary | ICD-10-CM | POA: Diagnosis present

## 2018-06-25 DIAGNOSIS — K08109 Complete loss of teeth, unspecified cause, unspecified class: Secondary | ICD-10-CM | POA: Insufficient documentation

## 2018-06-25 DIAGNOSIS — E118 Type 2 diabetes mellitus with unspecified complications: Secondary | ICD-10-CM

## 2018-06-25 DIAGNOSIS — G629 Polyneuropathy, unspecified: Secondary | ICD-10-CM

## 2018-06-25 DIAGNOSIS — C50919 Malignant neoplasm of unspecified site of unspecified female breast: Secondary | ICD-10-CM | POA: Insufficient documentation

## 2018-06-25 DIAGNOSIS — I350 Nonrheumatic aortic (valve) stenosis: Secondary | ICD-10-CM

## 2018-06-25 DIAGNOSIS — Z972 Presence of dental prosthetic device (complete) (partial): Secondary | ICD-10-CM

## 2018-06-25 DIAGNOSIS — Z96653 Presence of artificial knee joint, bilateral: Secondary | ICD-10-CM | POA: Insufficient documentation

## 2018-06-25 DIAGNOSIS — Z9989 Dependence on other enabling machines and devices: Secondary | ICD-10-CM

## 2018-06-25 DIAGNOSIS — E119 Type 2 diabetes mellitus without complications: Secondary | ICD-10-CM

## 2018-06-25 DIAGNOSIS — C569 Malignant neoplasm of unspecified ovary: Secondary | ICD-10-CM | POA: Insufficient documentation

## 2018-06-25 DIAGNOSIS — Z96619 Presence of unspecified artificial shoulder joint: Secondary | ICD-10-CM | POA: Insufficient documentation

## 2018-06-25 DIAGNOSIS — Z8739 Personal history of other diseases of the musculoskeletal system and connective tissue: Secondary | ICD-10-CM | POA: Insufficient documentation

## 2018-06-25 DIAGNOSIS — R32 Unspecified urinary incontinence: Secondary | ICD-10-CM | POA: Diagnosis present

## 2018-06-25 DIAGNOSIS — K219 Gastro-esophageal reflux disease without esophagitis: Secondary | ICD-10-CM | POA: Insufficient documentation

## 2018-06-25 DIAGNOSIS — E1165 Type 2 diabetes mellitus with hyperglycemia: Secondary | ICD-10-CM

## 2018-06-25 HISTORY — PX: RIGHT/LEFT HEART CATH AND CORONARY ANGIOGRAPHY: CATH118266

## 2018-06-25 LAB — POCT I-STAT 7, (LYTES, BLD GAS, ICA,H+H)
Acid-Base Excess: 5 mmol/L — ABNORMAL HIGH (ref 0.0–2.0)
Bicarbonate: 31.5 mmol/L — ABNORMAL HIGH (ref 20.0–28.0)
Calcium, Ion: 1.14 mmol/L — ABNORMAL LOW (ref 1.15–1.40)
HCT: 40 % (ref 36.0–46.0)
Hemoglobin: 13.6 g/dL (ref 12.0–15.0)
O2 Saturation: 97 %
Potassium: 3.7 mmol/L (ref 3.5–5.1)
Sodium: 138 mmol/L (ref 135–145)
TCO2: 33 mmol/L — ABNORMAL HIGH (ref 22–32)
pCO2 arterial: 51.3 mmHg — ABNORMAL HIGH (ref 32.0–48.0)
pH, Arterial: 7.396 (ref 7.350–7.450)
pO2, Arterial: 93 mmHg (ref 83.0–108.0)

## 2018-06-25 LAB — GLUCOSE, CAPILLARY
Glucose-Capillary: 121 mg/dL — ABNORMAL HIGH (ref 70–99)
Glucose-Capillary: 235 mg/dL — ABNORMAL HIGH (ref 70–99)
Glucose-Capillary: 153 mg/dL — ABNORMAL HIGH (ref 70–99)

## 2018-06-25 LAB — POCT I-STAT EG7
Acid-Base Excess: 6 mmol/L — ABNORMAL HIGH (ref 0.0–2.0)
Acid-Base Excess: 7 mmol/L — ABNORMAL HIGH (ref 0.0–2.0)
Bicarbonate: 33.2 mmol/L — ABNORMAL HIGH (ref 20.0–28.0)
Bicarbonate: 33.3 mmol/L — ABNORMAL HIGH (ref 20.0–28.0)
Calcium, Ion: 1.1 mmol/L — ABNORMAL LOW (ref 1.15–1.40)
Calcium, Ion: 1.11 mmol/L — ABNORMAL LOW (ref 1.15–1.40)
HCT: 40 % (ref 36.0–46.0)
HCT: 41 % (ref 36.0–46.0)
Hemoglobin: 13.6 g/dL (ref 12.0–15.0)
Hemoglobin: 13.9 g/dL (ref 12.0–15.0)
O2 Saturation: 64 %
O2 Saturation: 65 %
Potassium: 3.6 mmol/L (ref 3.5–5.1)
Potassium: 3.6 mmol/L (ref 3.5–5.1)
Sodium: 140 mmol/L (ref 135–145)
Sodium: 140 mmol/L (ref 135–145)
TCO2: 35 mmol/L — ABNORMAL HIGH (ref 22–32)
TCO2: 35 mmol/L — ABNORMAL HIGH (ref 22–32)
pCO2, Ven: 55.6 mmHg (ref 44.0–60.0)
pCO2, Ven: 56.3 mmHg (ref 44.0–60.0)
pH, Ven: 7.378 (ref 7.250–7.430)
pH, Ven: 7.385 (ref 7.250–7.430)
pO2, Ven: 35 mmHg (ref 32.0–45.0)
pO2, Ven: 35 mmHg (ref 32.0–45.0)

## 2018-06-25 LAB — BASIC METABOLIC PANEL
Anion gap: 11 (ref 5–15)
BUN: 14 mg/dL (ref 8–23)
CO2: 30 mmol/L (ref 22–32)
Calcium: 8.7 mg/dL — ABNORMAL LOW (ref 8.9–10.3)
Chloride: 97 mmol/L — ABNORMAL LOW (ref 98–111)
Creatinine, Ser: 0.77 mg/dL (ref 0.44–1.00)
GFR calc Af Amer: >60 mL/min (ref >60)
GFR calc non Af Amer: >60 mL/min (ref >60)
Glucose, Bld: 174 mg/dL — ABNORMAL HIGH (ref 70–99)
Potassium: 3.3 mmol/L — ABNORMAL LOW (ref 3.5–5.1)
Sodium: 138 mmol/L (ref 135–145)

## 2018-06-25 LAB — CBC
HCT: 40.1 % (ref 36.0–46.0)
Hemoglobin: 13 g/dL (ref 12.0–15.0)
MCH: 31 pg (ref 26.0–34.0)
MCHC: 32.4 g/dL (ref 30.0–36.0)
MCV: 95.7 fL (ref 80.0–100.0)
Platelets: 301 10*3/uL (ref 150–400)
RBC: 4.19 MIL/uL (ref 3.87–5.11)
RDW: 14.2 % (ref 11.5–15.5)
WBC: 8.7 10*3/uL (ref 4.0–10.5)
nRBC: 0 % (ref 0.0–0.2)

## 2018-06-25 LAB — LIPID PANEL
Cholesterol: 176 mg/dL (ref 0–200)
HDL: 55 mg/dL (ref >40)
LDL Cholesterol: 84 mg/dL (ref 0–99)
Total CHOL/HDL Ratio: 3.2 RATIO
Triglycerides: 187 mg/dL — ABNORMAL HIGH (ref <150)
VLDL: 37 mg/dL (ref 0–40)

## 2018-06-25 LAB — MAGNESIUM: Magnesium: 1.6 mg/dL — ABNORMAL LOW (ref 1.7–2.4)

## 2018-06-25 SURGERY — RIGHT/LEFT HEART CATH AND CORONARY ANGIOGRAPHY
Anesthesia: LOCAL

## 2018-06-25 MED ORDER — MAGNESIUM SULFATE 2 GM/50ML IV SOLN
2.0000 g | Freq: Once | INTRAVENOUS | Status: AC
  Administered 2018-06-25: 2 g via INTRAVENOUS
  Filled 2018-06-25: qty 50

## 2018-06-25 MED ORDER — HYDRALAZINE HCL 20 MG/ML IJ SOLN: 10.0000 mg | INTRAMUSCULAR | Status: DC | PRN

## 2018-06-25 MED ORDER — INSULIN STARTER KIT- PEN NEEDLES (ENGLISH): 1.0000 | Freq: Once | Status: DC

## 2018-06-25 MED ORDER — FENTANYL CITRATE (PF) 100 MCG/2ML IJ SOLN
INTRAMUSCULAR | Status: AC
  Filled 2018-06-25: qty 2

## 2018-06-25 MED ORDER — SODIUM CHLORIDE 0.9 % IV SOLN: INTRAVENOUS | Status: DC

## 2018-06-25 MED ORDER — SODIUM CHLORIDE 0.9 % IV SOLN
INTRAVENOUS | Status: AC
  Administered 2018-06-25: 12:00:00 via INTRAVENOUS

## 2018-06-25 MED ORDER — IOHEXOL 350 MG/ML SOLN
INTRAVENOUS | Status: DC | PRN
  Administered 2018-06-25: 65 mL via INTRAVENOUS

## 2018-06-25 MED ORDER — SODIUM CHLORIDE 0.9% FLUSH
3.0000 mL | Freq: Two times a day (BID) | INTRAVENOUS | Status: DC
  Administered 2018-06-25 – 2018-06-28 (×8): 3 mL via INTRAVENOUS

## 2018-06-25 MED ORDER — LIDOCAINE HCL (PF) 1 % IJ SOLN
INTRAMUSCULAR | Status: DC | PRN
  Administered 2018-06-25: 2 mL
  Administered 2018-06-25: 15 mL

## 2018-06-25 MED ORDER — HEPARIN (PORCINE) IN NACL 1000-0.9 UT/500ML-% IV SOLN
INTRAVENOUS | Status: DC | PRN
  Administered 2018-06-25 (×2): 500 mL

## 2018-06-25 MED ORDER — LIDOCAINE HCL (PF) 1 % IJ SOLN
INTRAMUSCULAR | Status: AC
  Filled 2018-06-25: qty 30

## 2018-06-25 MED ORDER — FUROSEMIDE 40 MG PO TABS
40.0000 mg | ORAL_TABLET | Freq: Every day | ORAL | Status: DC
  Administered 2018-06-26 – 2018-06-28 (×3): 40 mg via ORAL
  Filled 2018-06-25 (×3): qty 1

## 2018-06-25 MED ORDER — VERAPAMIL HCL 2.5 MG/ML IV SOLN
INTRAVENOUS | Status: AC
  Filled 2018-06-25: qty 2

## 2018-06-25 MED ORDER — MIDAZOLAM HCL 2 MG/2ML IJ SOLN
INTRAMUSCULAR | Status: DC | PRN
  Administered 2018-06-25: 0.5 mg via INTRAVENOUS

## 2018-06-25 MED ORDER — FENTANYL CITRATE (PF) 100 MCG/2ML IJ SOLN
INTRAMUSCULAR | Status: DC | PRN
  Administered 2018-06-25: 25 ug via INTRAVENOUS

## 2018-06-25 MED ORDER — POTASSIUM CHLORIDE 10 MEQ/100ML IV SOLN
10.0000 meq | INTRAVENOUS | Status: AC
  Administered 2018-06-25 (×5): 10 meq via INTRAVENOUS
  Filled 2018-06-25 (×5): qty 100

## 2018-06-25 MED ORDER — MIDAZOLAM HCL 2 MG/2ML IJ SOLN
INTRAMUSCULAR | Status: AC
  Filled 2018-06-25: qty 2

## 2018-06-25 MED ORDER — VERAPAMIL HCL 2.5 MG/ML IV SOLN
INTRAVENOUS | Status: DC | PRN
  Administered 2018-06-25: 08:00:00 10 mL via INTRA_ARTERIAL

## 2018-06-25 MED ORDER — SODIUM CHLORIDE 0.9% FLUSH
3.0000 mL | INTRAVENOUS | Status: DC | PRN
  Administered 2018-06-26: 10 mL via INTRAVENOUS
  Filled 2018-06-25: qty 3

## 2018-06-25 MED ORDER — ENOXAPARIN SODIUM 40 MG/0.4ML ~~LOC~~ SOLN
40.0000 mg | SUBCUTANEOUS | Status: DC
  Administered 2018-06-26 – 2018-06-27 (×2): 40 mg via SUBCUTANEOUS
  Filled 2018-06-25 (×2): qty 0.4

## 2018-06-25 MED ORDER — SODIUM CHLORIDE 0.9 % IV SOLN
250.0000 mL | INTRAVENOUS | Status: DC | PRN
  Administered 2018-06-26: 250 mL via INTRAVENOUS

## 2018-06-25 MED ORDER — HEPARIN (PORCINE) IN NACL 1000-0.9 UT/500ML-% IV SOLN
INTRAVENOUS | Status: AC
  Filled 2018-06-25: qty 1000

## 2018-06-25 MED ORDER — POTASSIUM CHLORIDE CRYS ER 20 MEQ PO TBCR
40.0000 meq | EXTENDED_RELEASE_TABLET | Freq: Every day | ORAL | Status: DC
  Administered 2018-06-26 – 2018-06-28 (×3): 40 meq via ORAL
  Filled 2018-06-25 (×5): qty 2

## 2018-06-25 MED ORDER — HEPARIN SODIUM (PORCINE) 1000 UNIT/ML IJ SOLN
INTRAMUSCULAR | Status: AC
  Filled 2018-06-25: qty 1

## 2018-06-25 MED ORDER — ACETAMINOPHEN 325 MG PO TABS
650.0000 mg | ORAL_TABLET | ORAL | Status: DC | PRN
  Administered 2018-06-28: 650 mg via ORAL
  Filled 2018-06-25: qty 2

## 2018-06-25 MED ORDER — HEPARIN SODIUM (PORCINE) 1000 UNIT/ML IJ SOLN
INTRAMUSCULAR | Status: DC | PRN
  Administered 2018-06-25: 4500 [IU] via INTRAVENOUS

## 2018-06-25 MED ORDER — ONDANSETRON HCL 4 MG/2ML IJ SOLN: 4.0000 mg | Freq: Four times a day (QID) | INTRAMUSCULAR | Status: DC | PRN

## 2018-06-25 MED ORDER — METOPROLOL TARTRATE 50 MG PO TABS
50.0000 mg | ORAL_TABLET | ORAL | Status: DC
  Filled 2018-06-25: qty 1

## 2018-06-25 MED ORDER — LABETALOL HCL 5 MG/ML IV SOLN: 10.0000 mg | INTRAVENOUS | Status: AC | PRN

## 2018-06-25 SURGICAL SUPPLY — 20 items
CATH INFINITI 5 FR JL3.5 (CATHETERS) ×1 IMPLANT
CATH INFINITI JR4 5F (CATHETERS) ×1 IMPLANT
CATH SWAN GANZ 7F STRAIGHT (CATHETERS) ×1 IMPLANT
CLOSURE MYNX CONTROL 6F/7F (Vascular Products) ×1 IMPLANT
COVER DOME SNAP 22 D (MISCELLANEOUS) ×1 IMPLANT
DEVICE RAD COMP TR BAND LRG (VASCULAR PRODUCTS) ×1 IMPLANT
ELECT DEFIB PAD ADLT CADENCE (PAD) ×2 IMPLANT
GLIDESHEATH SLEND A-KIT 6F 22G (SHEATH) ×1 IMPLANT
GUIDEWIRE INQWIRE 1.5J.035X260 (WIRE) IMPLANT
HOVERMATT SINGLE USE (MISCELLANEOUS) ×1 IMPLANT
INQWIRE 1.5J .035X260CM (WIRE) ×2
KIT HEART LEFT (KITS) ×2 IMPLANT
PACK CARDIAC CATHETERIZATION (CUSTOM PROCEDURE TRAY) ×2 IMPLANT
SHEATH PINNACLE 7F 10CM (SHEATH) ×1 IMPLANT
SHEATH PROBE COVER 6X72 (BAG) ×1 IMPLANT
TRANSDUCER W/STOPCOCK (MISCELLANEOUS) ×3 IMPLANT
TUBING ART PRESS 72  MALE/FEM (TUBING) ×1
TUBING ART PRESS 72 MALE/FEM (TUBING) IMPLANT
TUBING CIL FLEX 10 FLL-RA (TUBING) ×2 IMPLANT
WIRE EMERALD ST .035X150CM (WIRE) ×1 IMPLANT

## 2018-06-25 NOTE — Consult Note (Addendum)
[facility] VALVE TEAM  Inpatient TAVR Consultation:   Patient ID: NYISHA CLIPPARD; [id number]; 11-05-1945   Admit date: 06/22/2018 Date of Consult: 06/25/2018  Primary Care Provider: System, Pcp Not In Primary Cardiologist: Dr. Lamar Blinks --> Dr. Linford Arnold. Wants to transfer care here - Dr. Sallyanne Kuster.   Patient Profile:   Tanya [name] is a 73 y.o. 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  History of Present Illness:   All history is obtained from the patient's daughter, Tanya [name], over telephone and her chart.   Ms. Huseman has lived independently in an apartment in [place], [place]. Due to recent health decline she has moved in with her daughter who lives in [place], [place]. She has a son that lives in [place]. She no longer drives due to left arm issues related to a previous shoulder injury. She can walk without a walker or cane and otherwise functions independently. She has full dentures. She has a 30 pack year smoking history but has never been formally diagnosed with COPD. She has been wearing 2L of home oxygen over the past year or so.   She has a history of breast cancer s/p chemo and left radical mastectomy in 1994 and ovarian cancer s/p chemo s/p radical hysterectomy in 2010. She has left arm lymphedema since her mastectomy.   She has had bilateral total knee replacements in 2000 and 2004 and total reverse shoulder replacement in 2017. After her shoulder replacement she  developed a right shoulder DVT and septic arthritis in her shoulder and knee requiring debridement and IV abx. She was also placed Coumadin. She had recurrence of septic arthritis and was placed on prophylactic doxycycline. Since her admission for septic arthritis she has been on home 02.   She has been followed by Dr. Lamar Blinks with cardiology in [place] but recently switched care to Dr. Linford Arnold. She has been followed over time for HTN, PAF on Eliquis, and aortic stenosis. Over the past 6 months, she has had a progressive decline in her functional status. Two weeks prior to admission she developed dyspnea on exertion with only a few steps, hypoxia, chest tightness and dizziness with exertion. She also had LE edema.   She presented to [facility] ED on 4/14 with 10/10 chest pain and acute respiratory failure. The patient was intubated in the ER. Initial labs revealed WBC 18 without lymphopenia. LDH 241. D-Dimer  0.87. BNP 447. Troponin-I 0.21. Glucose 429. CXR revealed bilat opacities with pulmonary edema with R effusion. Cardiology was consulted and Dr. Sallyanne Kuster performed a bedside echo which showed EF 50%, heavily calcified aortic valve and heavy MAC. Complete echo was recommended when ruled out for Covid 19. She was started on an insulin gtt, IV lasix and empiric antibiotics.   She has ruled out for Covid-19.  She was able to be extubated the following day and placed on Bipap. She is now transitioned to HFNC. 2D echo on 06/23/18 showed EF 40-45%, mod MAC with mod MR/MS, severe aortic stenosis with mod AI (mean  gradient 40 mm Hg, peak gradient 69, AVA, 0.64, DVI 0.17).   L/RHC today showed widely patent coronary arteries/normal coronary arteries with left coronary system dominance. Mean aortic valve gradient 37 mmHg, and mean mitral valve gradient 6.4 mm. Calculated aortic valve area 0.8 (Fick output) - 0.89  (thermodilution) cm.  Mitral valve area 1.59 and 1.76 cm respectively. Mean pulmonary capillary  wedge pressure 15 mmHg.  Normal pulmonary artery pressure.  Hemodynamics suggest low normal filling pressures. She has diuresed 3.1 L and her weight is down 7 lbs ( 199--> 192 lbs).   She is feeling much better in terms of her breathing now. No current chest pain, shortness of breath or LE edema. Laying in bed comfortably on 4L HFNC.    Past Medical History:  Diagnosis Date   Asthma    Breast cancer in female [facility])    1994 s/p chemo and radical L mastectomy   Diabetes ([place])    Full dentures    GERD (gastroesophageal reflux disease)    Gout    H/O total shoulder replacement    History of knee replacement, total, bilateral    History of septic arthritis    infected prosthetic shoulder and knee s/p debridement and abx. now on daily doxycycline.   HTN (hypertension)    Neuropathy    OSA on CPAP    Ovarian cancer ([place])    2012 s/p chemo and radical hysterectomy   PAF (paroxysmal atrial fibrillation) ([place])    on Eliquis   Severe aortic stenosis    Urine incontinence     Past Surgical History:  Procedure Laterality Date   MASTECTOMY Left 1993     Inpatient Medications: Scheduled Meds:  aspirin  81 mg Per Tube Daily   azithromycin  500 mg Oral Daily   chlorhexidine gluconate (MEDLINE KIT)  15 mL Mouth Rinse BID   [START ON 06/26/2018] enoxaparin (LOVENOX) injection  40 mg Subcutaneous Q24H   furosemide  40 mg Intravenous Q12H   insulin aspart  0-15 Units Subcutaneous TID WC   insulin aspart  0-5 Units Subcutaneous QHS   insulin detemir  14 Units Subcutaneous BID   living well with diabetes book   Does not apply Once   [START ON 06/26/2018] potassium chloride  40 mEq Oral Daily   sodium chloride flush  3 mL Intravenous Q12H   Continuous Infusions:  sodium chloride     sodium chloride     cefTRIAXone (ROCEPHIN)  IV 1 g (06/24/18 2020)   magnesium sulfate 1 - 4 g bolus IVPB 2 g (06/25/18 1020)   potassium chloride 10 mEq (06/25/18 1025)   PRN  Meds: sodium chloride, acetaminophen, albuterol, dextrose, fentaNYL (SUBLIMAZE) injection, fentaNYL (SUBLIMAZE) injection, hydrALAZINE, labetalol, midazolam, midazolam, nitroGLYCERIN, ondansetron (ZOFRAN) IV, sodium chloride flush  Allergies:    Allergies  Allergen Reactions   Pneumovax [Pneumococcal Polysaccharide Vaccine] Swelling    Arm swelling   Sulfa Antibiotics Other (See Comments)    Childhood allergy Reaction unknown   Levaquin [Levofloxacin] Rash    Social History:   Social History   Socioeconomic History   Marital status: Single    Spouse name: Not on file   Number of children: Not on file   Years of education: Not on file   Highest education level: Not on file  Occupational History   Not on file  Social Needs   Financial resource strain: Not on file   Food insecurity:    Worry: Not on file  Inability: Not on file   Transportation needs:    Medical: Not on file    Non-medical: Not on file  Tobacco Use   Smoking status: Former Smoker    Types: Cigarettes   Smokeless tobacco: Never Used  Substance and Sexual Activity   Alcohol use: Not on file   Drug use: Not on file   Sexual activity: Not on file  Lifestyle   Physical activity:    Days per week: Not on file    Minutes per session: Not on file   Stress: Not on file  Relationships   Social connections:    Talks on phone: Not on file    Gets together: Not on file    Attends religious service: Not on file    Active member of club or organization: Not on file    Attends meetings of clubs or organizations: Not on file    Relationship status: Not on file   Intimate partner violence:    Fear of current or ex partner: Not on file    Emotionally abused: Not on file    Physically abused: Not on file    Forced sexual activity: Not on file  Other Topics Concern   Not on file  Social History Narrative   Not on file    Family History:   The patient's family history includes Heart  disease in her father; Hypertension in her mother; Renal cancer in her brother.  ROS:  Please see the history of present illness.  ROS  All other ROS reviewed and negative.     Physical Exam/Data:   Vitals:   06/25/18 0857 06/25/18 0902 06/25/18 1009 06/25/18 1029  BP: (!) 144/68 (!) 147/62 105/75 124/64  Pulse: 94 93  80  Resp: _0 Temp:    98.7 F (37.1 C)  TempSrc:    Oral  SpO2: 95% 94% 97% 97%  Weight:      Height:        Intake/Output Summary (Last 24 hours) at 06/25/2018 1053 Last data filed at 06/25/2018 1000 Gross per 24 hour  Intake 653.89 ml  Output 2325 ml  Net -1671.11 ml   Filed Weights   06/23/18 0431 06/24/18 0409 06/24/18 1623  Weight: 90.4 kg 89.2 kg 87.3 kg   Body mass index is 35.2 kg/m.  Limited exam done over E link given covid19 pandemic: obese white female comfortably resting in bed.   EKG:  The EKG was personally reviewed and demonstrates: LBBB, sinus tachy  Telemetry:  Telemetry was personally reviewed and demonstrates:  Sinus with short run of NSVT  Relevant CV Studies: Echo 06/23/18 IMPRESSIONS  1. The left ventricle has mild-moderately reduced systolic function, with an ejection fraction of 40-45%. The cavity size was normal. Left ventricular diastolic function could not be evaluated. Left ventricular diffuse hypokinesis.  2. The right ventricle has normal systolic function. The cavity was normal. There is no increase in right ventricular wall thickness. Right ventricular systolic pressure is moderately elevated with an estimated pressure of 64.6 mmHg.  3. Left atrial size was moderately dilated.  4. Right atrial size was moderately dilated.  5. The mitral valve is degenerative. Mild thickening of the mitral valve leaflet. There is moderate mitral annular calcification present. Mitral valve regurgitation is moderate by color flow Doppler. Moderate mitral valve stenosis.  6. The aortic valve is tricuspid. Severely thickening of the  aortic valve. Severe calcifcation of the aortic valve. Aortic valve regurgitation is moderate by  color flow Doppler. Severe stenosis of the aortic valve.  7. Pulmonary hypertension is moderate.  FINDINGS  Left Ventricle: The left ventricle has mild-moderately reduced systolic function, with an ejection fraction of 40-45%. The cavity size was normal. There is no increase in left ventricular wall thickness. Left ventricular diastolic function could not be  evaluated. Left ventricular diffuse hypokinesis. Right Ventricle: The right ventricle has normal systolic function. The cavity was normal. There is no increase in right ventricular wall thickness. Right ventricular systolic pressure is moderately elevated with an estimated pressure of 64.6 mmHg. Left Atrium: Left atrial size was moderately dilated. Right Atrium: Right atrial size was moderately dilated. Right atrial pressure is estimated at 3 mmHg. Interatrial Septum: No atrial level shunt detected by color flow Doppler. Pericardium: There is no evidence of pericardial effusion. Mitral Valve: The mitral valve is degenerative in appearance. Mild thickening of the mitral valve leaflet. There is moderate mitral annular calcification present. Mitral valve regurgitation is moderate by color flow Doppler. Moderate mitral valve  stenosis. Tricuspid Valve: The tricuspid valve is normal in structure. Tricuspid valve regurgitation is mild by color flow Doppler. Aortic Valve: The aortic valve is tricuspid Severely thickening of the aortic valve. Severe calcifcation of the aortic valve, with severely decreased cusp excursion. Aortic valve regurgitation is moderate by color flow Doppler. There is Severe stenosis  of the aortic valve, with a calculated valve area of 0.61 cm. Pulmonic Valve: The pulmonic valve was grossly normal. Pulmonic valve regurgitation is trivial by color flow Doppler. Pulmonary Artery: Pulmonary hypertension is moderate. Venous: The  inferior vena cava is normal in size with greater than 50% respiratory variability.  ______________  [facility]  06/25/2018 Conclusion  Left dominant coronary anatomy with normal coronary arteries.  Severe aortic stenosis.  Moderate mitral stenosis.  Normal pulmonary artery pressures  Normal filling pressures.  LVEDP 13 mmHg and pulmonary capillary wedge mean 15 mmHg.  Severe mitral annular calcification and to a lesser extent aortic valve calcification on cine fluoroscopy.  RECOMMENDATIONS:   Discussed with Dr. Sherren Mocha.  Consideration is being given to TAVR.       Laboratory Data:  Chemistry Recent Labs  Lab 06/23/18 0946 06/24/18 0344 06/25/18 0640  NA 139 140 138  K 3.3* 3.8 3.3*  CL 102 101 97*  CO2 _0 GLUCOSE 179* 146* 174*  BUN _1 CREATININE 0.68 0.69 0.77  CALCIUM 8.0* 8.3* 8.7*  GFRNONAA >60 >60 >60  GFRAA >60 >60 >60  ANIONGAP _2 Recent Labs  Lab 06/22/18 1905  PROT 7.4  ALBUMIN 4.2  AST 48*  ALT 34  ALKPHOS 69  BILITOT 1.2   Hematology Recent Labs  Lab 06/23/18 0438 06/24/18 0344 06/25/18 0640  WBC 11.5* 9.9 8.7  RBC 3.82* 3.95 4.19  HGB 11.8* 12.5 13.0  HCT 37.1 37.9 40.1  MCV 97.1 95.9 95.7  MCH 30.9 31.6 31.0  MCHC 31.8 33.0 32.4  RDW 14.4 14.3 14.2  PLT 315 300 301   Cardiac Enzymes Recent Labs  Lab 06/22/18 2338 06/23/18 0438 06/23/18 1425 06/24/18 0344  TROPONINI 0.21* 0.85* 0.54* 0.44*    Recent Labs  Lab 06/22/18 1857  TROPIPOC 0.02    BNP Recent Labs  Lab 06/22/18 1905  BNP 447.3*    DDimer  Recent Labs  Lab 06/22/18 1935  DDIMER 0.87*    Radiology/Studies:  Dg Chest 1 View  Result Date: 06/23/2018 CLINICAL DATA:  Shortness of breath  EXAM: CHEST  1 VIEW COMPARISON:  06/23/2018 FINDINGS: Bilateral interstitial and alveolar airspace opacities with more focal bilateral lower lobe airspace disease. Small bilateral pleural effusions. No pneumothorax. Stable cardiomegaly.  Right jugular central venous catheter with the tip projecting over the SVC. No acute osseous abnormality. IMPRESSION: Bilateral lower lobe airspace disease concerning for pneumonia. Bilateral interstitial and alveolar airspace opacities with cardiomegaly likely reflecting pulmonary edema and/or associated multilobar pneumonia. Electronically Signed   By: Kathreen Devoid   On: 06/23/2018 15:59   Dg Chest Port 1 View  Result Date: 06/24/2018 CLINICAL DATA:  Acute respiratory failure EXAM: PORTABLE CHEST 1 VIEW COMPARISON:  06/23/2018 FINDINGS: Right jugular central venous catheter is stable. Normal heart size. Bilateral airspace disease and bilateral pleural effusions have improved. No pneumothorax. Left shoulder total arthroplasty. IMPRESSION: Improved bilateral airspace disease and bilateral pleural effusions. Electronically Signed   By: Marybelle Killings M.D.   On: 06/24/2018 08:20   Dg Chest Port 1 View  Result Date: 06/23/2018 CLINICAL DATA:  Acute hypercapnic respiratory failure. Endotracheal tube present. EXAM: PORTABLE CHEST 1 VIEW COMPARISON:  06/22/2018 FINDINGS: Endotracheal tube, right jugular central venous catheter, and orogastric tube remain in appropriate position. Stable mild cardiomegaly. Stable diffuse interstitial infiltrates, suspicious for pulmonary edema. Decreased lung volumes are seen, with increased atelectasis or consolidation in left retrocardiac lung base. Probable small bilateral pleural effusions, without change. IMPRESSION: 1. Decreased lung volumes with increased atelectasis or consolidation in left retrocardiac lung base. 2. Stable cardiomegaly and diffuse interstitial infiltrates. Probable small bilateral pleural effusions. Electronically Signed   By: Earle Gell M.D.   On: 06/23/2018 07:41   Dg Chest Portable 1 View  Result Date: 06/22/2018 CLINICAL DATA:  Status post central line placement EXAM: PORTABLE CHEST 1 VIEW COMPARISON:  06/22/2018 FINDINGS: Cardiac shadow remains  enlarged. Endotracheal tube and nasogastric catheter are again noted and stable. New right jugular central line is noted at the cavoatrial junction. The degree of vascular congestion and asymmetric edema has improved in the interval from the prior exam. Right-sided pleural effusion is noted. No pneumothorax is seen. IMPRESSION: Improved aeration in the right lung with decrease in the degree of edema. Right effusion is noted. Electronically Signed   By: Inez Catalina M.D.   On: 06/22/2018 23:35   Dg Chest Portable 1 View  Result Date: 06/22/2018 CLINICAL DATA:  Check endotracheal tube placement EXAM: PORTABLE CHEST 1 VIEW COMPARISON:  06/22/2018 FINDINGS: Cardiac shadow is stable. Endotracheal tube and gastric catheter are noted in satisfactory position. Stable changes of vascular congestion and asymmetric edema are again noted. No new focal abnormality is seen. Left shoulder replacement is noted. IMPRESSION: Increased vascular congestion and asymmetric edema right greater than left. Tubes and lines as described. Electronically Signed   By: Inez Catalina M.D.   On: 06/22/2018 20:16   Dg Chest Port 1 View  Result Date: 06/22/2018 CLINICAL DATA:  Chest pain and shortness of breath. EXAM: PORTABLE CHEST 1 VIEW COMPARISON:  04/27/2003. FINDINGS: 1850 hours. The cardio pericardial silhouette is enlarged. Diffuse interstitial and bibasilar airspace disease evident, right greater than left. Tiny bilateral pleural effusions. Bones are diffusely demineralized. Patient is status post left shoulder replacement. Telemetry leads overlie the chest. IMPRESSION: Cardiomegaly with interstitial and asymmetric airspace disease, right greater than left. Tiny bilateral pleural effusions. Imaging features suggest asymmetric pulmonary edema although diffuse infection not excluded. Electronically Signed   By: Misty Stanley M.D.   On: 06/22/2018 19:24     STS Risk Calculator: Procedure:  AV Replacement   Risk of Mortality:   7.479%   Renal Failure:  2.760%   Permanent Stroke:  0.862%   Prolonged Ventilation:  29.096%   DSW Infection:  0.247%   Reoperation:  3.393%   Morbidity or Mortality:  31.728%   Short Length of Stay:  10.498%   Long Length of Stay:  20.076%      Assessment and Plan:   Tanya [name] is a 73 y.o. female with symptoms of severe, stage D1 aortic stenosis with NYHA Class IV symptoms. I have reviewed the patient's recent echocardiogram which is notable for mildly reduced LV systolic function EF 95-63% with severe aortic stenosis with peak gradient of 69 mmHg and mean transvalvular gradient of 40 mmHg. The patient's dimensionless index is 0.27 and calculated aortic valve area is 0.69 cm. She also has moderate MAC with moderate mitral stenosis and mitral regurgitation. L/RHC today showed widely patent coronary arteries/normal coronary arteries with left coronary system dominance. Mean aortic valve gradient 37 mmHg, and mean mitral valve gradient 6.4 mm. Calculated aortic valve area 0.8 (Fick output) - 0.89 (thermodilution) cm.  Mitral valve area 1.59 and 1.76 cm respectively. Mean pulmonary capillary wedge pressure 15 mmHg.  Normal pulmonary artery pressure.  Hemodynamics suggest low normal filling pressures.   I have reviewed the natural history of aortic stenosis with the patient. We have discussed the limitations of medical therapy and the poor prognosis associated with symptomatic aortic stenosis. We have reviewed potential treatment options, including palliative medical therapy, conventional surgical aortic valve replacement, and transcatheter aortic valve replacement. We discussed treatment options in the context of this patient's specific comorbid medical conditions.   She is an independent women with significant functional decline over the past 6 months who presented to [facility] in acute respiratory failure 2/2 acute CHF likely from progression of valvular heart disease. She initially required  mechanical ventilation and bipap but is now tolerating 4L HFNC comfortably. She has been adequately diuresed and has low normal filling pressure on cath. She is clinically feeling much better.   The patient's predicted risk of mortality with conventional aortic valve replacement is 7.49% primarily based on age, morbid obesity, chronic lung disease, acute CHF, OSA on CPAP, PAF, HTN, diabetes and urgency of valve replacement. Other significant comorbid conditions include previous breast and ovarian cancers and physical deconditioning. TAVR seems like a reasonable treatment option for this patient pending formal cardiac surgical consultation. Although she also has mitral valve disease, she would be a very poor surgical candidate because of her above  chronic commodities. We discussed typical evaluation which will require a gated cardiac CTA and a CTA of the chest/abdomen/pelvis to evaluate both his cardiac anatomy and peripheral vasculature. This will be done over the weekend. [facility] completed. Plan for tentative TAVR next Tuesday.   Dr Roxy Manns to follow.    Signed, Angelena Form, PA-C  06/25/2018 10:53 AM   I have seen and examined the patient and agree with the assessment as outlined above by Angelena Form, PA-C  Patient is 73 year old morbidly obese female with history of aortic stenosis, hypertension, recurrent paroxysmal atrial fibrillation on Eliquis anticoagulation, type 2 diabetes mellitus, hyperlipidemia, breast cancer status post left radical mastectomy and chemotherapy in the past, ovarian cancer status post chemotherapy and radical hysterectomy in 2010, chronic left arm lymphedema, obstructive sleep apnea with COPD on home oxygen therapy, left bundle branch block, and limited physical mobility with history of multiple previous orthopedic surgical procedures who was admitted to the  hospital June 22, 2018 with acute respiratory failure.  She ruled out for COVID 19 infection and her clinical  course has been notably consistent with acute exacerbation of chronic combined systolic and diastolic congestive heart failure, New York Heart Association functional class IV.  She initially required mechanical ventilation but she improved rapidly with diuretic therapy, was extubated from the ventilator, and has made excellent progress.  She continues to be treated with intravenous antibiotics although she has not had fevers, leukocytosis, nor radiographic findings suggestive of pneumonia.  Transthoracic echocardiogram revealed severe aortic stenosis with mild global left ventricular systolic dysfunction.  There was moderate mitral annular calcification and mild to moderate mitral stenosis with moderate mitral regurgitation.  Diagnostic cardiac catheterization was notable for the absence of significant coronary artery disease and right heart pressures were only mildly elevated.  Cardiothoracic surgical consultation was requested.  I have personally reviewed the patient's multiple chest radiographs, EKGs, lab work, transthoracic echocardiogram, diagnostic cardiac catheterization, and CT angiogram of the chest abdomen and pelvis.  Cardiac gated CT angiogram of the heart is been performed but images have not been transferred to the [facility], and official reports of both CT angiograms remain pending at this time.  Transthoracic echocardiogram reveals severe aortic stenosis.  The aortic valve is trileaflet with severe thickening, calcification, and restricted leaflet mobility involving all 3 leaflets of the aortic valve.  Peak velocity across the aortic valve measured as high as 4.2 m/s corresponding to mean transvalvular gradient estimated well above 40 mmHg.  Left ventricular systolic function was mildly reduced with ejection fraction estimated 45%.  There was moderate mitral annular calcification.  Doppler flow interrogation suggested the presence of moderate mitral stenosis although the  anterior leaflet of the mitral valve appears to move fairly well.  There is mild to moderate central mitral regurgitation.  Diagnostic cardiac catheterization is notable for the absence of significant coronary artery disease and revealed only mild pulmonary hypertension.    I agree the patient needs aortic valve replacement.  I would be reluctant to consider this patient a candidate for conventional surgical aortic valve replacement because of her advanced age, numerous severe comorbid medical conditions, and somewhat limited physical mobility with significant physical deconditioning.  Baseline EKGs have demonstrated both atrial fibrillation and sinus rhythm with underlying left bundle branch block.  CT angiogram of the chest abdomen and pelvis has not been officially reviewed but appears to demonstrate adequate pelvic vascular access to facilitate transfemoral approach for transcatheter aortic valve replacement.  Cardiac gated CT angiogram of the heart has been performed earlier today but it has not been interpreted and images are not currently available for review.  Depending on findings from cardiac gated CT angiogram of the heart, I would recommend proceeding with transcatheter aortic valve replacement via transfemoral approach during this hospitalization.  The patient was counseled at length regarding treatment alternatives for management of severe symptomatic aortic stenosis. Alternative approaches such as conventional aortic valve replacement, transcatheter aortic valve replacement, and continued medical therapy without intervention were compared and contrasted at length.  The risks associated with conventional surgical aortic valve replacement were discussed in detail, as were expectations for post-operative convalescence, and why I would be reluctant to consider this patient a candidate for conventional surgery.  Issues specific to transcatheter aortic valve replacement were discussed including  questions about long term valve durability, the potential for paravalvular leak, possible increased risk of need for permanent pacemaker placement, and other technical complications related to the  procedure itself.  Long-term prognosis with medical therapy was discussed. This discussion was placed in the context of the patient's own specific clinical presentation and past medical history.  All of their questions have been addressed.  The patient desires to proceed with TAVR as soon as practical.  Following the decision to proceed with transcatheter aortic valve replacement, a discussion has been held regarding what types of management strategies would be attempted intraoperatively in the event of life-threatening complications, including whether or not the patient would be considered a candidate for the use of cardiopulmonary bypass and/or conversion to open sternotomy for attempted surgical intervention.  The patient has been advised of a variety of complications that might develop including but not limited to risks of death, stroke, paravalvular leak, aortic dissection or other major vascular complications, aortic annulus rupture, device embolization, cardiac rupture or perforation, mitral regurgitation, acute myocardial infarction, arrhythmia, heart block or bradycardia requiring permanent pacemaker placement, congestive heart failure, respiratory failure, renal failure, pneumonia, infection, other late complications related to structural valve deterioration or migration, or other complications that might ultimately cause a temporary or permanent loss of functional independence or other long term morbidity.  The patient provides full informed consent for the procedure as described and all questions were answered.   I spent in excess of 120 minutes during the conduct of this hospital encounter and >50% of this time involved direct face-to-face encounter with the patient for counseling and/or coordination of  their care.    Rexene Alberts, MD 06/26/2018 5:45 PM

## 2018-06-25 NOTE — Progress Notes (Signed)
Received patient from 63[facility] ICU.  Patient oriented to unit and assessed, CCMD called, CHG bath performed, and foley care provided.

## 2018-06-25 NOTE — CV Procedure (Signed)
   Right and left heart cath to assess aortic and mitral valve disease.  Right radial and right femoral vascular access using real-time vascular ultrasound guidance.  Single anterior wall stick both sides.  Cine fluoroscopy demonstrating massive mitral annular calcification and to a lesser extent aortic valve calcification.  Widely patent coronary arteries/normal coronary arteries with left coronary system dominance.  Mean aortic valve gradient 37 mmHg, and mean mitral valve gradient 6.4 mm.  Calculated aortic valve area 0.8 (Fick output) - 0.89  (thermodilution) cm.  Mitral valve area 1.59 and 1.76 cm respectively.  Mean pulmonary capillary wedge pressure 15 mmHg.  Normal pulmonary artery pressure.  Hemodynamics suggest low normal filling pressures.

## 2018-06-25 NOTE — Progress Notes (Signed)
Assisted tele visit to patient with Marita Snellen PA-C Cardiothoracic surgery.  Maryelizabeth Rowan, RN

## 2018-06-25 NOTE — Progress Notes (Signed)
RT placed pt on CPAP for the night in auto titrate high 20 low 8 with 3 lpm bled into the system. RT will continue to monitor.

## 2018-06-25 NOTE — Progress Notes (Signed)
Report called to 4E RN 

## 2018-06-25 NOTE — Progress Notes (Signed)
Progress Note  Patient Name: Tanya [name] Date of Encounter: 06/25/2018  Primary Cardiologist: Sanda Klein, MD   Subjective   The patient underwent cardiac catheterization this morning.  The patient is having mild chest discomfort.  Her breathing is improved.  Inpatient Medications    Scheduled Meds: . [MAR Hold] aspirin  81 mg Per Tube Daily  . [MAR Hold] azithromycin  500 mg Oral Daily  . [MAR Hold] chlorhexidine gluconate (MEDLINE KIT)  15 mL Mouth Rinse BID  . [MAR Hold] enoxaparin (LOVENOX) injection  40 mg Subcutaneous Q24H  . [MAR Hold] furosemide  40 mg Intravenous Q12H  . [MAR Hold] insulin aspart  0-15 Units Subcutaneous TID WC  . [MAR Hold] insulin aspart  0-5 Units Subcutaneous QHS  . [MAR Hold] insulin detemir  14 Units Subcutaneous BID  . [MAR Hold] living well with diabetes book   Does not apply Once   Continuous Infusions: . sodium chloride 150 mL/hr at 06/22/18 2141  . sodium chloride 10 mL/hr at 06/24/18 0411  . [MAR Hold] sodium chloride 10 mL/hr at 06/25/18 0700  . [START ON 06/26/2018] sodium chloride    . [MAR Hold] cefTRIAXone (ROCEPHIN)  IV 1 g (06/24/18 2020)  . magnesium sulfate 1 - 4 g bolus IVPB    . potassium chloride     PRN Meds: [MAR Hold] sodium chloride, [MAR Hold] albuterol, [MAR Hold] dextrose, [MAR Hold] fentaNYL (SUBLIMAZE) injection, [MAR Hold] fentaNYL (SUBLIMAZE) injection, fentaNYL, [MAR Hold] hydrALAZINE, lidocaine (PF), [MAR Hold] midazolam, [MAR Hold] midazolam, midazolam, [MAR Hold] nitroGLYCERIN   Vital Signs    Vitals:   06/25/18 0500 06/25/18 0600 06/25/18 0700 06/25/18 0759  BP: 133/65 (!) 120/59 124/75   Pulse: 74 74 64   Resp: '20 18 19   ' Temp:      TempSrc:      SpO2: 97% 98% 96% 99%  Weight:      Height:        Intake/Output Summary (Last 24 hours) at 06/25/2018 0835 Last data filed at 06/25/2018 0700 Gross per 24 hour  Intake 868.81 ml  Output 3200 ml  Net -2331.19 ml   Last 3 Weights 06/24/2018  06/24/2018 06/23/2018  Weight (lbs) 192 lb 7.4 oz 196 lb 10.4 oz 199 lb 4.7 oz  Weight (kg) 87.3 kg 89.2 kg 90.4 kg      Telemetry    Normal sinus rhythm heart rate currently in the 70s- Personally Reviewed   Physical Exam  Alert, oriented woman in no distress GEN: No acute distress.   Neck: No JVD Cardiac: RRR, 3/6 harsh late peaking systolic murmur at the right upper sternal border Respiratory: Coarse breath sounds bilaterally. GI: Soft, nontender, non-distended  MS: No edema; No deformity.  Right radial site clear Neuro:  Nonfocal  Psych: Normal affect   Labs    Chemistry Recent Labs  Lab 06/22/18 1905  06/23/18 0946 06/24/18 0344 06/25/18 0640  NA 135   < > 139 140 138  K 5.1   < > 3.3* 3.8 3.3*  CL 101   < > 102 101 97*  CO2 18*   < > '27 27 30  ' GLUCOSE 429*   < > 179* 146* 174*  BUN 22   < > '15 15 14  ' CREATININE 1.00   < > 0.68 0.69 0.77  CALCIUM 8.8*   < > 8.0* 8.3* 8.7*  PROT 7.4  --   --   --   --   ALBUMIN 4.2  --   --   --   --  AST 48*  --   --   --   --   ALT 34  --   --   --   --   ALKPHOS 69  --   --   --   --   BILITOT 1.2  --   --   --   --   GFRNONAA 55*   < > >60 >60 >60  GFRAA >60   < > >60 >60 >60  ANIONGAP 16*   < > '10 12 11   ' < > = values in this interval not displayed.     Hematology Recent Labs  Lab 06/23/18 0438 06/24/18 0344 06/25/18 0640  WBC 11.5* 9.9 8.7  RBC 3.82* 3.95 4.19  HGB 11.8* 12.5 13.0  HCT 37.1 37.9 40.1  MCV 97.1 95.9 95.7  MCH 30.9 31.6 31.0  MCHC 31.8 33.0 32.4  RDW 14.4 14.3 14.2  PLT 315 300 301    Cardiac Enzymes Recent Labs  Lab 06/22/18 2338 06/23/18 0438 06/23/18 1425 06/24/18 0344  TROPONINI 0.21* 0.85* 0.54* 0.44*    Recent Labs  Lab 06/22/18 1857  TROPIPOC 0.02     BNP Recent Labs  Lab 06/22/18 1905  BNP 447.3*     DDimer  Recent Labs  Lab 06/22/18 1935  DDIMER 0.87*     Radiology    Dg Chest 1 View  Result Date: 06/23/2018 CLINICAL DATA:  Shortness of breath EXAM:  CHEST  1 VIEW COMPARISON:  06/23/2018 FINDINGS: Bilateral interstitial and alveolar airspace opacities with more focal bilateral lower lobe airspace disease. Small bilateral pleural effusions. No pneumothorax. Stable cardiomegaly. Right jugular central venous catheter with the tip projecting over the SVC. No acute osseous abnormality. IMPRESSION: Bilateral lower lobe airspace disease concerning for pneumonia. Bilateral interstitial and alveolar airspace opacities with cardiomegaly likely reflecting pulmonary edema and/or associated multilobar pneumonia. Electronically Signed   By: Kathreen Devoid   On: 06/23/2018 15:59   Dg Chest Port 1 View  Result Date: 06/24/2018 CLINICAL DATA:  Acute respiratory failure EXAM: PORTABLE CHEST 1 VIEW COMPARISON:  06/23/2018 FINDINGS: Right jugular central venous catheter is stable. Normal heart size. Bilateral airspace disease and bilateral pleural effusions have improved. No pneumothorax. Left shoulder total arthroplasty. IMPRESSION: Improved bilateral airspace disease and bilateral pleural effusions. Electronically Signed   By: Marybelle Killings M.D.   On: 06/24/2018 08:20    Cardiac Studies   TTE: 06/23/18  1. The left ventricle has mild-moderately reduced systolic function, with an ejection fraction of 40-45%. The cavity size was normal. Left ventricular diastolic function could not be evaluated. Left ventricular diffuse hypokinesis. 2. The right ventricle has normal systolic function. The cavity was normal. There is no increase in right ventricular wall thickness. Right ventricular systolic pressure is moderately elevated with an estimated pressure of 64.6 mmHg. 3. Left atrial size was moderately dilated. 4. Right atrial size was moderately dilated. 5. The mitral valve is degenerative. Mild thickening of the mitral valve leaflet. There is moderate mitral annular calcification present. Mitral valve regurgitation is moderate by color flow Doppler. Moderate mitral  valve stenosis. 6. The aortic valve is tricuspid. Severely thickening of the aortic valve. Severe calcifcation of the aortic valve. Aortic valve regurgitation is moderate by color flow Doppler. Severe stenosis of the aortic valve. 7. Pulmonary hypertension is moderate.  Patient Profile     73 y.o. 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 Acute on Chronic combined HF: Transitioned off Bipap to HFNC. Diursed -3.1L. weight down 7 Lb (199>>192). - On IV lasix 50m BID. Pending R/LHC this morning for further evaluation of of AS and possible TAVR candidate.   2. Severe AS: noted on echo. Pending cath this morning.   3. Moderate mitral stenosis with moderate mitral regurg  4. Elevated troponin: peak of 0.85. Pending cath. Continue ASA.   For questions or updates, please contact [place] consult [email or web address] for contact info under    Signed,Leanor Kail PA  06/25/2018, 8:35 AM    Patient seen, examined. Available data reviewed. Agree with findings, assessment, and plan as outlined by VRobbie Lis PA-C.  The physical exam findings documented above reflect my personal findings of today's examination.  The patient did well with right and left heart catheterization.  I personally reviewed her films and hemodynamic data.  Cardiac catheterization confirms widely patent coronary arteries.  Hemodynamic data confirms severe aortic stenosis with a mean transvalvular gradient of 37 mmHg and calculated aortic valve area of 0.89 cm.  The patient's cardiac output is preserved at 4.4 L/min with an index of 2.35.  Her mitral stenosis does not appear to be more than mild to moderate with a mean  gradient of 6 mmHg.  The patient's filling pressures are essentially normal and her mean PA pressure is 22 mmHg.  The patient has impressive/severe mitral annular calcification seen on plain fluoroscopy.  Plans as previously outlined.  I think we should discontinue IV diuretics and convert to oral furosemide.  Anticipate a gated cardiac CTA and CTA of the chest, abdomen, and pelvis tomorrow as long as her renal function is stable.  She seems to be slowly improving from a respiratory perspective and is tolerating O2 per nasal cannula.  Will request formal cardiac surgical consultation with Dr. ORoxy Mannsas part of a multidisciplinary approach to her care.  If he is in agreement, will consider TAVR early next week pending review of her CTA studies.  I have discussed our plans extensively with the patient in person and with her daughter, Tanya [name]  over the telephone.  Tanya [name] M.D. 06/25/2018 12:18 PM

## 2018-06-25 NOTE — Progress Notes (Signed)
PROGRESS NOTE    Tanya [name]  APO:[id number] DOB: 01/09/1946 DOA: 06/22/2018 PCP: System, Pcp Not In   Brief Narrative:  73 yo WF PMHx  HTN, DM type II uncontrolled with complication, LBBB, Acute Systolic CHF severe LEFT left AS  Presented to [facility] ED with 10/10 chest pain, beginning several days prior. Patient exhibited respiratory distress with tachypnea, and associated tachycardia to 140s, and was intubated in the ED. Prior to being intubated, there was no known history of nausea, vomiting, URI sx, but patient did present with some diaphoresis. Family has not been able to provide history over the phone. In ED, patient started on insulin gtt for glucose >400. Patient started on azithromycin and ceftriaxone for WBC 18 with CXR revealing bilateral opacities. Cardiology consulted on patient in ED given presenting chest pain. Bedside echo revealed LVEF approximately 50%. ECG reveals LBBB.  Initial labs reveal WBC 18 without lymphopenia. LDH 241. D-Dimer  0.87. BNP 447. Glucose 429. CXR reveals bilat opacities. Patient will be tested for COVID-19 and is a COVID rule-out.     Subjective: 4/17 A/O x4, negative CP, negative S OB, negative abdominal pain.  States cardiology is going to have a meeting this afternoon to talk about how/which valves to replace     Assessment & Plan:   Principal Problem:   Severe calcific aortic stenosis Active Problems:   Acute hypercapnic respiratory failure (HCC)   DKA (diabetic ketoacidoses) (HCC)   Elevated brain natriuretic peptide (BNP) level   Abnormal CXR   Pulmonary edema   Neutrophilic leukocytosis  Acute systolic CHF/Severe Aortic stenosis -Strict in and out -3.2 L -Daily weight Filed Weights   06/23/18 0431 06/24/18 0409 06/24/18 1623  Weight: 90.4 kg 89.2 kg 87.3 kg  - Cardiology to evaluate for cardiac catheterization possible aortic valve replacement -Patient states has new cardiologist unknown based weight - Furosemide 40 mg twice  daily -Appears that patient will be scheduled for TAVR  Pulmonary hypertension - See CHF  Chest pain - Most likely secondary to acute systolic CHF and fluid overload -Have treated underlying problem and chest pain resolved  Acute respiratory failure - Most likely secondary to acute systolic CHF - On admission patient was evaluated for COVID-19 rule out.  SARS coronavirus negative -Titrate O2 to maintain SPO2> 92%  Septic shock - Patient off pressors - Cultures NGTD.  Given that patient will undergo cardiac catheterization and may undergo valve replacement continue antibiotics for now. -4/17 will discontinue antibiotics which was started initially for septic shock on admission (patient not in septic shock).  Surgery will start antibiotics as they see fit for surgery.     Diabetes type 2 uncontrolled with complication  -4/38 hemoglobin A1c = 8.5  - Levemir 14 units twice daily - Moderate SSI - Consult to diabetic coordinator - Consult to diabetic nutrition - Lipid panel pending  HLD   Hypokalemia -Potassium goal> 4 - Potassium IV 50 mEq    Hypomagnesmia -Magnesium goal> 2 - Magnesium 2 g      DVT prophylaxis: Lovenox Code Status: Full Family Communication: None Disposition Plan: Per cardiology   Consultants:  Cardiology    Procedures/Significant Events:  4/15 echocardiogram: EF t=40-45%.-Left ventricular diffuse hypokinesis. -Right ventricular systolic pressure is moderately elevated;~ 64.6 mmHg. Left Atrium/RIGHT atrium:  moderately dilated. Mitral Valve: Moderate mitral valve stenosis. Aortic valve:  regurgitation is moderate,  Severe stenosis of the aortic valve, with a calculated valve area of 0.61 cm. Pulmonary Artery: Pulmonary hypertension is moderate.  I have personally reviewed and interpreted all radiology studies and my findings are as above.  VENTILATOR SETTINGS: None   Cultures 4/14 respiratory virus panel negative 4/14 SARS  coronavirus negative 4/14 blood RIGHT forearm NGTD 4/14 blood RIGHT hand NGTD 4/14 MRSA by PCR negative   Antimicrobials: Anti-infectives (From admission, onward)   Start     Stop   06/24/18 2000  cefTRIAXone (ROCEPHIN) 1 g in sodium chloride 0.9 % 100 mL IVPB         06/24/18 2000  azithromycin (ZITHROMAX) tablet 500 mg         06/22/18 2000  cefTRIAXone (ROCEPHIN) 2 g in sodium chloride 0.9 % 100 mL IVPB  Status:  Discontinued     06/24/18 0734   06/22/18 2000  azithromycin (ZITHROMAX) 500 mg in sodium chloride 0.9 % 250 mL IVPB  Status:  Discontinued     06/24/18 0933       Devices   LINES / TUBES:     Continuous Infusions:  sodium chloride 150 mL/hr at 06/22/18 2141   sodium chloride 10 mL/hr at 06/24/18 0411   [MAR Hold] sodium chloride 10 mL/hr at 06/25/18 0700   [START ON 06/26/2018] sodium chloride     [MAR Hold] cefTRIAXone (ROCEPHIN)  IV 1 g (06/24/18 2020)     Objective: Vitals:   06/25/18 0500 06/25/18 0600 06/25/18 0700 06/25/18 0759  BP: 133/65 (!) 120/59 124/75   Pulse: 74 74 64   Resp: '20 18 19   ' Temp:      TempSrc:      SpO2: 97% 98% 96% 99%  Weight:      Height:        Intake/Output Summary (Last 24 hours) at 06/25/2018 0829 Last data filed at 06/25/2018 0700 Gross per 24 hour  Intake 868.81 ml  Output 3200 ml  Net -2331.19 ml   Filed Weights   06/23/18 0431 06/24/18 0409 06/24/18 1623  Weight: 90.4 kg 89.2 kg 87.3 kg   General: A/O x4, no acute respiratory distress Eyes: negative scleral hemorrhage, negative anisocoria, negative icterus ENT: Negative Runny nose, negative gingival bleeding, Neck:  Negative scars, masses, torticollis, lymphadenopathy, JVD Lungs: Clear to auscultation bilaterally without wheezes or crackles Cardiovascular: Regular rate and rhythm positive holosystolic murmur 3/6, without  gallop or rub normal S1 and S2 Abdomen: Obese, negative abdominal pain, nondistended, positive soft, bowel sounds, no rebound, no  ascites, no appreciable mass Extremities: No significant cyanosis, clubbing, or edema bilateral lower extremities Skin: Negative rashes, lesions, ulcers Psychiatric:  Negative depression, negative anxiety, negative fatigue, negative mania  Central nervous system:  Cranial nerves II through XII intact, tongue/uvula midline, all extremities muscle strength 5/5, sensation intact throughout, negative dysarthria, negative expressive aphasia, negative receptive aphasia.    Data Reviewed: Care during the described time interval was provided by me .  I have reviewed this patient's available data, including medical history, events of note, physical examination, and all test results as part of my evaluation.   CBC: Recent Labs  Lab 06/22/18 1905  06/22/18 2343 06/23/18 0327 06/23/18 0438 06/24/18 0344 06/25/18 0640  WBC 18.0*  --  14.1*  --  11.5* 9.9 8.7  NEUTROABS 12.2*  --   --   --   --   --   --   HGB 13.7   < > 11.8* 12.2 11.8* 12.5 13.0  HCT 44.4   < > 36.5 36.0 37.1 37.9 40.1  MCV 98.4  --  96.8  --  97.1 95.9 95.7  PLT 465*  --  320  --  315 300 301   < > = values in this interval not displayed.   Basic Metabolic Panel: Recent Labs  Lab 06/22/18 1905  06/22/18 2338 06/23/18 0327 06/23/18 0438 06/23/18 0946 06/24/18 0344 06/25/18 0640  NA 135   < > 139 138  --  139 140 138  K 5.1   < > 4.2 3.7  --  3.3* 3.8 3.3*  CL 101  --  106  --   --  102 101 97*  CO2 18*  --  24  --   --  '27 27 30  ' GLUCOSE 429*  --  205*  --   --  179* 146* 174*  BUN 22  --  22  --   --  '15 15 14  ' CREATININE 1.00  --  0.76  --   --  0.68 0.69 0.77  CALCIUM 8.8*  --  7.8*  --   --  8.0* 8.3* 8.7*  MG 1.7  --  1.4*  --  1.3*  --  1.4* 1.6*  PHOS 7.6*  --  4.5  --  3.7  --  3.9  --    < > = values in this interval not displayed.   GFR: Estimated Creatinine Clearance: 62.3 mL/min (by C-G formula based on SCr of 0.77 mg/dL). Liver Function Tests: Recent Labs  Lab 06/22/18 1905  AST 48*  ALT 34    ALKPHOS 69  BILITOT 1.2  PROT 7.4  ALBUMIN 4.2   Recent Labs  Lab 06/22/18 1905  LIPASE 23   No results for input(s): AMMONIA in the last 168 hours. Coagulation Profile: Recent Labs  Lab 06/22/18 1935  INR 1.2   Cardiac Enzymes: Recent Labs  Lab 06/22/18 2338 06/23/18 0438 06/23/18 1425 06/24/18 0344  TROPONINI 0.21* 0.85* 0.54* 0.44*   BNP (last 3 results) No results for input(s): PROBNP in the last 8760 hours. HbA1C: Recent Labs    06/23/18 0930  HGBA1C 8.5*   CBG: Recent Labs  Lab 06/24/18 0333 06/24/18 0756 06/24/18 1111 06/24/18 1505 06/24/18 2148  GLUCAP 141* 170* 158* 178* 170*   Lipid Profile: No results for input(s): CHOL, HDL, LDLCALC, TRIG, CHOLHDL, LDLDIRECT in the last 72 hours. Thyroid Function Tests: Recent Labs    06/22/18 2343  TSH 0.871   Anemia Panel: Recent Labs    06/22/18 2343  FERRITIN 203   Urine analysis:    Component Value Date/Time   COLORURINE YELLOW 06/22/2018 2140   APPEARANCEUR HAZY (A) 06/22/2018 2140   LABSPEC 1.021 06/22/2018 2140   PHURINE 5.0 06/22/2018 2140   GLUCOSEU >=500 (A) 06/22/2018 2140   HGBUR NEGATIVE 06/22/2018 2140   [place] NEGATIVE 06/22/2018 2140   KETONESUR NEGATIVE 06/22/2018 2140   PROTEINUR 100 (A) 06/22/2018 2140   NITRITE NEGATIVE 06/22/2018 2140   LEUKOCYTESUR NEGATIVE 06/22/2018 2140   Sepsis Labs: '@LABRCNTIP' (procalcitonin:4,lacticidven:4)  ) Recent Results (from the past 240 hour(s))  Culture, blood (routine x 2)     Status: None (Preliminary result)   Collection Time: 06/22/18  7:47 PM  Result Value Ref Range Status   Specimen Description BLOOD RIGHT FOREARM  Final   Special Requests   Final    BOTTLES DRAWN AEROBIC AND ANAEROBIC Blood Culture results may not be optimal due to an inadequate volume of blood received in culture bottles   Culture   Final    NO GROWTH 3 DAYS Performed at [facility], [place] [address], [place], [place] [address]    Report Status  PENDING  Incomplete  Culture, blood (routine x 2)     Status: None (Preliminary result)   Collection Time: 06/22/18  7:49 PM  Result Value Ref Range Status   Specimen Description BLOOD RIGHT HAND  Final   Special Requests   Final    BOTTLES DRAWN AEROBIC ONLY Blood Culture results may not be optimal due to an excessive volume of blood received in culture bottles   Culture   Final    NO GROWTH 3 DAYS Performed at [facility], [place] [address] [place], [place] [address]    Report Status PENDING  Incomplete  Respiratory Panel by PCR     Status: None   Collection Time: 06/22/18  8:40 PM  Result Value Ref Range Status   Adenovirus NOT DETECTED NOT DETECTED Final   Coronavirus 229E NOT DETECTED NOT DETECTED Final    Comment: (NOTE) The Coronavirus on the Respiratory Panel, DOES NOT test for the novel  Coronavirus (2019 nCoV)    Coronavirus HKU1 NOT DETECTED NOT DETECTED Final   Coronavirus NL63 NOT DETECTED NOT DETECTED Final   Coronavirus OC43 NOT DETECTED NOT DETECTED Final   Metapneumovirus NOT DETECTED NOT DETECTED Final   Rhinovirus / Enterovirus NOT DETECTED NOT DETECTED Final   Influenza A NOT DETECTED NOT DETECTED Final   Influenza B NOT DETECTED NOT DETECTED Final   Parainfluenza Virus 1 NOT DETECTED NOT DETECTED Final   Parainfluenza Virus 2 NOT DETECTED NOT DETECTED Final   Parainfluenza Virus 3 NOT DETECTED NOT DETECTED Final   Parainfluenza Virus 4 NOT DETECTED NOT DETECTED Final   Respiratory Syncytial Virus NOT DETECTED NOT DETECTED Final   Bordetella pertussis NOT DETECTED NOT DETECTED Final   Chlamydophila pneumoniae NOT DETECTED NOT DETECTED Final   Mycoplasma pneumoniae NOT DETECTED NOT DETECTED Final    Comment: Performed at [facility], [place]. [address], [place], [place] [address]  SARS Coronavirus 2 [facility] order, Performed in [facility] hospital lab)     Status: None   Collection Time: 06/22/18  8:40 PM  Result Value Ref Range Status   SARS  Coronavirus 2 NEGATIVE NEGATIVE Final    Comment: (NOTE) If result is NEGATIVE SARS-CoV-2 target nucleic acids are NOT DETECTED. The SARS-CoV-2 RNA is generally detectable in upper and lower  respiratory specimens during the acute phase of infection. The lowest  concentration of SARS-CoV-2 viral copies this assay can detect is 250  copies / mL. A negative result does not preclude SARS-CoV-2 infection  and should not be used as the sole basis for treatment or other  patient management decisions.  A negative result may occur with  improper specimen collection / handling, submission of specimen other  than nasopharyngeal swab, presence of viral mutation(s) within the  areas targeted by this assay, and inadequate number of viral copies  (<250 copies / mL). A negative result must be combined with clinical  observations, patient history, and epidemiological information. If result is POSITIVE SARS-CoV-2 target nucleic acids are DETECTED. The SARS-CoV-2 RNA is generally detectable in upper and lower  respiratory specimens dur ing the acute phase of infection.  Positive  results are indicative of active infection with SARS-CoV-2.  Clinical  correlation with patient history and other diagnostic information is  necessary to determine patient infection status.  Positive results do  not rule out bacterial infection or co-infection with other viruses. If result is  PRESUMPTIVE POSTIVE SARS-CoV-2 nucleic acids MAY BE PRESENT.   A presumptive positive result was obtained on the submitted specimen  and confirmed on repeat testing.  While 2019 novel coronavirus  (SARS-CoV-2) nucleic acids may be present in the submitted sample  additional confirmatory testing may be necessary for epidemiological  and / or clinical management purposes  to differentiate between  SARS-CoV-2 and other Sarbecovirus currently known to infect humans.  If clinically indicated additional testing with an alternate test    methodology [phone]) is advised. The SARS-CoV-2 RNA is generally  detectable in upper and lower respiratory sp ecimens during the acute  phase of infection. The expected result is Negative. Fact Sheet for Patients:  StrictlyIdeas.no Fact Sheet for Healthcare Providers: BankingDealers.co.za This test is not yet approved or cleared by the [place] FDA and has been authorized for detection and/or diagnosis of SARS-CoV-2 by FDA under an Emergency Use Authorization (EUA).  This EUA will remain in effect (meaning this test can be used) for the duration of the COVID-19 declaration under Section 564(b)(1) of the Act, 21 U.S.C. section 360bbb-3(b)(1), unless the authorization is terminated or revoked sooner. Performed at [facility], [place] [address] [place], [place] [address]   MRSA PCR Screening     Status: None   Collection Time: 06/22/18 11:25 PM  Result Value Ref Range Status   MRSA by PCR NEGATIVE NEGATIVE Final    Comment:        The GeneXpert MRSA Assay (FDA approved for NASAL specimens only), is one component of a comprehensive MRSA colonization surveillance program. It is not intended to diagnose MRSA infection nor to guide or monitor treatment for MRSA infections. Performed at [facility], [place] [address], [place], [place] [address]          Radiology Studies: Dg Chest 1 View  Result Date: 06/23/2018 CLINICAL DATA:  Shortness of breath EXAM: CHEST  1 VIEW COMPARISON:  06/23/2018 FINDINGS: Bilateral interstitial and alveolar airspace opacities with more focal bilateral lower lobe airspace disease. Small bilateral pleural effusions. No pneumothorax. Stable cardiomegaly. Right jugular central venous catheter with the tip projecting over the SVC. No acute osseous abnormality. IMPRESSION: Bilateral lower lobe airspace disease concerning for pneumonia. Bilateral interstitial and alveolar airspace opacities with  cardiomegaly likely reflecting pulmonary edema and/or associated multilobar pneumonia. Electronically Signed   By: Kathreen Devoid   On: 06/23/2018 15:59   Dg Chest Port 1 View  Result Date: 06/24/2018 CLINICAL DATA:  Acute respiratory failure EXAM: PORTABLE CHEST 1 VIEW COMPARISON:  06/23/2018 FINDINGS: Right jugular central venous catheter is stable. Normal heart size. Bilateral airspace disease and bilateral pleural effusions have improved. No pneumothorax. Left shoulder total arthroplasty. IMPRESSION: Improved bilateral airspace disease and bilateral pleural effusions. Electronically Signed   By: Marybelle Killings M.D.   On: 06/24/2018 08:20        Scheduled Meds:  [MAR Hold] aspirin  81 mg Per Tube Daily   [MAR Hold] azithromycin  500 mg Oral Daily   [MAR Hold] chlorhexidine gluconate (MEDLINE KIT)  15 mL Mouth Rinse BID   [MAR Hold] enoxaparin (LOVENOX) injection  40 mg Subcutaneous Q24H   [MAR Hold] furosemide  40 mg Intravenous Q12H   [MAR Hold] insulin aspart  0-15 Units Subcutaneous TID WC   [MAR Hold] insulin aspart  0-5 Units Subcutaneous QHS   [MAR Hold] insulin detemir  14 Units Subcutaneous BID   [MAR Hold] living well with diabetes book   Does not apply  Once   Continuous Infusions:  sodium chloride 150 mL/hr at 06/22/18 2141   sodium chloride 10 mL/hr at 06/24/18 0411   [MAR Hold] sodium chloride 10 mL/hr at 06/25/18 0700   [START ON 06/26/2018] sodium chloride     [MAR Hold] cefTRIAXone (ROCEPHIN)  IV 1 g (06/24/18 2020)     LOS: 3 days    Time spent: 40 minutes     Cire Clute, Geraldo Docker, MD [facility] Pager [phone]  If 7PM-7AM, please contact night-coverage [email or web address] Password [facility] 06/25/2018, 8:29 AM

## 2018-06-25 NOTE — Progress Notes (Signed)
Nutrition Consult for Diet Education  RD working remotely.  RD consulted for nutrition education regarding diabetes and heart healthy diet.   Lab Results  Component Value Date   HGBA1C 8.5 (H) 06/23/2018    Spoke with patient on the phone. She welcomed information regarding a heart healthy consistent carbohydrate diet. Patient plans to move in with her daughter after discharge from hospital. She requested that I mail education materials to her daughter's house.  We discussed different food groups and their effects on blood sugar, emphasizing carbohydrate-containing foods. Discussed importance of controlled and consistent carbohydrate intake throughout the day. Provided examples of ways to balance meals/snacks and encouraged intake of high-fiber, whole grain complex carbohydrates.   Provided examples on ways to decrease sodium and fat intake in diet. Discouraged intake of processed foods and use of salt shaker. Encouraged fresh fruits and vegetables as well as whole grain sources of carbohydrates to maximize fiber intake. Teach back method used.  Expect good compliance.  RD to mail patient "Heart-Healthy Consistent Carbohydrate Nutrition Therapy" handout from the [employer] of [employer]. Education handouts also attached to discharge instructions.   Body mass index is 35.2 kg/m. Pt meets criteria for obesity based on current BMI.  Current diet order is heart healthy, patient is consuming approximately 75% of meals at this time. Labs and medications reviewed. No further nutrition interventions warranted at this time. If additional nutrition issues arise, please re-consult RD.   Molli Barrows, RD, LDN, [place] Pager [phone] After Hours Pager [phone]

## 2018-06-25 NOTE — Progress Notes (Signed)
RT took patient off BiPAP and placed patient on 8L salter.  Patient vitals stable.

## 2018-06-25 NOTE — Progress Notes (Signed)
Spiritual care F/U attempted by this chaplain.  RN informed the chaplain the Pt. was moved to Cath. Lab.  The chaplain is available for F/U spiritual care as needed.

## 2018-06-26 ENCOUNTER — Inpatient Hospital Stay (HOSPITAL_COMMUNITY): Payer: Medicare Other

## 2018-06-26 ENCOUNTER — Encounter (HOSPITAL_COMMUNITY): Payer: Self-pay | Admitting: Radiology

## 2018-06-26 DIAGNOSIS — J9622 Acute and chronic respiratory failure with hypercapnia: Secondary | ICD-10-CM

## 2018-06-26 DIAGNOSIS — E1159 Type 2 diabetes mellitus with other circulatory complications: Secondary | ICD-10-CM

## 2018-06-26 DIAGNOSIS — Z9989 Dependence on other enabling machines and devices: Secondary | ICD-10-CM

## 2018-06-26 DIAGNOSIS — I5021 Acute systolic (congestive) heart failure: Secondary | ICD-10-CM | POA: Insufficient documentation

## 2018-06-26 DIAGNOSIS — I5033 Acute on chronic diastolic (congestive) heart failure: Secondary | ICD-10-CM | POA: Diagnosis present

## 2018-06-26 DIAGNOSIS — E119 Type 2 diabetes mellitus without complications: Secondary | ICD-10-CM

## 2018-06-26 DIAGNOSIS — J189 Pneumonia, unspecified organism: Secondary | ICD-10-CM

## 2018-06-26 DIAGNOSIS — I248 Other forms of acute ischemic heart disease: Secondary | ICD-10-CM | POA: Diagnosis present

## 2018-06-26 DIAGNOSIS — I050 Rheumatic mitral stenosis: Secondary | ICD-10-CM | POA: Diagnosis present

## 2018-06-26 DIAGNOSIS — I350 Nonrheumatic aortic (valve) stenosis: Secondary | ICD-10-CM

## 2018-06-26 DIAGNOSIS — J9621 Acute and chronic respiratory failure with hypoxia: Secondary | ICD-10-CM

## 2018-06-26 DIAGNOSIS — G4733 Obstructive sleep apnea (adult) (pediatric): Secondary | ICD-10-CM

## 2018-06-26 DIAGNOSIS — I5043 Acute on chronic combined systolic (congestive) and diastolic (congestive) heart failure: Secondary | ICD-10-CM | POA: Diagnosis present

## 2018-06-26 DIAGNOSIS — I480 Paroxysmal atrial fibrillation: Secondary | ICD-10-CM

## 2018-06-26 DIAGNOSIS — I2721 Secondary pulmonary arterial hypertension: Secondary | ICD-10-CM | POA: Diagnosis present

## 2018-06-26 LAB — BASIC METABOLIC PANEL
Anion gap: 10 (ref 5–15)
BUN: 9 mg/dL (ref 8–23)
CO2: 29 mmol/L (ref 22–32)
Calcium: 8.8 mg/dL — ABNORMAL LOW (ref 8.9–10.3)
Chloride: 101 mmol/L (ref 98–111)
Creatinine, Ser: 0.59 mg/dL (ref 0.44–1.00)
GFR calc Af Amer: >60 mL/min (ref >60)
GFR calc non Af Amer: >60 mL/min (ref >60)
Glucose, Bld: 172 mg/dL — ABNORMAL HIGH (ref 70–99)
Potassium: 3.9 mmol/L (ref 3.5–5.1)
Sodium: 140 mmol/L (ref 135–145)

## 2018-06-26 LAB — CBC
HCT: 37.9 % (ref 36.0–46.0)
Hemoglobin: 12.1 g/dL (ref 12.0–15.0)
MCH: 30.3 pg (ref 26.0–34.0)
MCHC: 31.9 g/dL (ref 30.0–36.0)
MCV: 94.8 fL (ref 80.0–100.0)
Platelets: 285 10*3/uL (ref 150–400)
RBC: 4 MIL/uL (ref 3.87–5.11)
RDW: 14.1 % (ref 11.5–15.5)
WBC: 7 10*3/uL (ref 4.0–10.5)
nRBC: 0 % (ref 0.0–0.2)

## 2018-06-26 LAB — GLUCOSE, CAPILLARY
Glucose-Capillary: 156 mg/dL — ABNORMAL HIGH (ref 70–99)
Glucose-Capillary: 138 mg/dL — ABNORMAL HIGH (ref 70–99)
Glucose-Capillary: 158 mg/dL — ABNORMAL HIGH (ref 70–99)
Glucose-Capillary: 286 mg/dL — ABNORMAL HIGH (ref 70–99)

## 2018-06-26 LAB — MAGNESIUM: Magnesium: 1.7 mg/dL (ref 1.7–2.4)

## 2018-06-26 MED ORDER — POLYETHYLENE GLYCOL 3350 17 G PO PACK
17.0000 g | PACK | Freq: Every day | ORAL | Status: DC
  Administered 2018-06-26: 18:00:00 17 g via ORAL
  Filled 2018-06-26: qty 1

## 2018-06-26 MED ORDER — IOHEXOL 350 MG/ML SOLN
100.0000 mL | Freq: Once | INTRAVENOUS | Status: AC | PRN
  Administered 2018-06-26: 11:00:00 100 mL via INTRAVENOUS

## 2018-06-26 NOTE — Progress Notes (Signed)
Progress Note  Patient Name: Tanya [name] Date of Encounter: 06/26/2018  Primary Cardiologist: Dr. Dani Gobble Croitoru  Subjective   Resting comfortably in bed.  No chest pain or shortness of breath at rest.  Inpatient Medications    Scheduled Meds: . aspirin  81 mg Per Tube Daily  . azithromycin  500 mg Oral Daily  . chlorhexidine gluconate (MEDLINE KIT)  15 mL Mouth Rinse BID  . enoxaparin (LOVENOX) injection  40 mg Subcutaneous Q24H  . furosemide  40 mg Oral Daily  . insulin aspart  0-15 Units Subcutaneous TID WC  . insulin aspart  0-5 Units Subcutaneous QHS  . insulin detemir  14 Units Subcutaneous BID  . insulin starter kit- pen needles  1 kit Other Once  . living well with diabetes book   Does not apply Once  . metoprolol tartrate  50 mg Oral UD  . potassium chloride  40 mEq Oral Daily  . sodium chloride flush  3 mL Intravenous Q12H   Continuous Infusions: . sodium chloride    . cefTRIAXone (ROCEPHIN)  IV 1 g (06/25/18 1952)   PRN Meds: sodium chloride, acetaminophen, albuterol, dextrose, fentaNYL (SUBLIMAZE) injection, fentaNYL (SUBLIMAZE) injection, hydrALAZINE, midazolam, midazolam, nitroGLYCERIN, ondansetron (ZOFRAN) IV, sodium chloride flush   Vital Signs    Vitals:   06/25/18 1948 06/25/18 2332 06/26/18 0427 06/26/18 0630  BP: (!) 133/57 129/82 (!) 131/38   Pulse: 86 83 80   Resp: _0 Temp: 98.3 F (36.8 C) 98.6 F (37 C) 98.8 F (37.1 C)   TempSrc: Oral Oral Oral   SpO2: 96% 96% 97%   Weight:    87.2 kg  Height:        Intake/Output Summary (Last 24 hours) at 06/26/2018 0905 Last data filed at 06/26/2018 0429 Gross per 24 hour  Intake 805.37 ml  Output 1165 ml  Net -359.63 ml   Filed Weights   06/24/18 0409 06/24/18 1623 06/26/18 0630  Weight: 89.2 kg 87.3 kg 87.2 kg    Telemetry    Sinus rhythm.  Personally reviewed.  ECG    Tracing from 06/23/2018 shows a sinus rhythm with left bundle branch block.  Personally reviewed.   Physical Exam   GEN: No acute distress.   Neck: No JVD. Cardiac: RRR, 3/6 harsh systolic murmur consistent with AS, no gallop.  Respiratory: Nonlabored. Coarse breath sounds. GI: Soft, nontender, bowel sounds present. MS: No edema; No deformity.  Ecchymoses on the forearms. Neuro:  Nonfocal. Psych: Alert and oriented x 3. Normal affect.  Labs    Chemistry Recent Labs  Lab 06/22/18 1905  06/24/18 0344 06/25/18 0640 06/25/18 0833 06/25/18 0837 06/26/18 0620  NA 135   < > 140 138 140  140 138 140  K 5.1   < > 3.8 3.3* 3.6  3.6 3.7 3.9  CL 101   < > 101 97*  --   --  101  CO2 18*   < > 27 30  --   --  29  GLUCOSE 429*   < > 146* 174*  --   --  172*  BUN 22   < > 15 14  --   --  9  CREATININE 1.00   < > 0.69 0.77  --   --  0.59  CALCIUM 8.8*   < > 8.3* 8.7*  --   --  8.8*  PROT 7.4  --   --   --   --   --   --  ALBUMIN 4.2  --   --   --   --   --   --   AST 48*  --   --   --   --   --   --   ALT 34  --   --   --   --   --   --   ALKPHOS 69  --   --   --   --   --   --   BILITOT 1.2  --   --   --   --   --   --   GFRNONAA 55*   < > >60 >60  --   --  >60  GFRAA >60   < > >60 >60  --   --  >60  ANIONGAP 16*   < > 12 11  --   --  10   < > = values in this interval not displayed.     Hematology Recent Labs  Lab 06/24/18 0344 06/25/18 0640 06/25/18 0833 06/25/18 0837 06/26/18 0620  WBC 9.9 8.7  --   --  7.0  RBC 3.95 4.19  --   --  4.00  HGB 12.5 13.0 13.6  13.9 13.6 12.1  HCT 37.9 40.1 40.0  41.0 40.0 37.9  MCV 95.9 95.7  --   --  94.8  MCH 31.6 31.0  --   --  30.3  MCHC 33.0 32.4  --   --  31.9  RDW 14.3 14.2  --   --  14.1  PLT 300 301  --   --  285    Cardiac Enzymes Recent Labs  Lab 06/22/18 2338 06/23/18 0438 06/23/18 1425 06/24/18 0344  TROPONINI 0.21* 0.85* 0.54* 0.44*    Recent Labs  Lab 06/22/18 1857  TROPIPOC 0.02     BNP Recent Labs  Lab 06/22/18 1905  BNP 447.3*     DDimer  Recent Labs  Lab 06/22/18 1935  DDIMER 0.87*      Radiology    No results found.  Cardiac Studies   Cardiac catheterization 06/25/2018:  Left dominant coronary anatomy with normal coronary arteries.  Severe aortic stenosis.  Moderate mitral stenosis.  Normal pulmonary artery pressures  Normal filling pressures.  LVEDP 13 mmHg and pulmonary capillary wedge mean 15 mmHg.  Severe mitral annular calcification and to a lesser extent aortic valve calcification on cine fluoroscopy.  RECOMMENDATIONS:   Discussed with Dr. Sherren Mocha.  Consideration is being given to TAVR.  Echocardiogram 06/23/2018:  1. The left ventricle has mild-moderately reduced systolic function, with an ejection fraction of 40-45%. The cavity size was normal. Left ventricular diastolic function could not be evaluated. Left ventricular diffuse hypokinesis.  2. The right ventricle has normal systolic function. The cavity was normal. There is no increase in right ventricular wall thickness. Right ventricular systolic pressure is moderately elevated with an estimated pressure of 64.6 mmHg.  3. Left atrial size was moderately dilated.  4. Right atrial size was moderately dilated.  5. The mitral valve is degenerative. Mild thickening of the mitral valve leaflet. There is moderate mitral annular calcification present. Mitral valve regurgitation is moderate by color flow Doppler. Moderate mitral valve stenosis.  6. The aortic valve is tricuspid. Severely thickening of the aortic valve. Severe calcifcation of the aortic valve. Aortic valve regurgitation is moderate by color flow Doppler. Severe stenosis of the aortic valve.  7. Pulmonary hypertension is moderate.  Patient Profile     73  y.o. female presenting with acute on chronic combined heart failure associated with acute on chronic hypoxic and hypercapnic respiratory failure in the setting of severe aortic stenosis and moderate mitral stenosis.  Assessment & Plan    1.  Severe aortic stenosis by  echocardiogram and subsequent cardiac catheterization.  TAVR is being considered for early next week pending review of CT imaging studies over the weekend.  Dr. Roxy Manns has been consulted for review as well.  2.  Moderate mitral stenosis.  3.  Normal coronary arteries at cardiac catheterization April 17.  4.  Demand ischemia with peak troponin I 0.85.  Case has been reviewed by Dr. Burt Knack, pending review by Dr. Roxy Manns.  CTA imaging of chest, abdomen, and pelvis are pending for today.  Renal function stable.  Patient continues on aspirin, Lovenox, Lopressor, and has been converted to oral Lasix with potassium supplements.  Follow-up CBC and BMET in a.m.  Potential TAVR early next week.  Signed, Rozann Lesches, MD  06/26/2018, 9:05 AM

## 2018-06-26 NOTE — Progress Notes (Signed)
RT offered pt CPAP dream station for the night pt stated she cannot tolerate the NM b/c it irritates her eyes and nose. RT asked if pt wanted to try a full face mask and pt declined stating she would be fine without it. RT removed equipment from pt room at this time. RT will continue to monitor.

## 2018-06-26 NOTE — Progress Notes (Signed)
PROGRESS NOTE  Tanya [name] BSJ:[id number] DOB: [birth year] DOA: 06/22/2018 PCP: System, Pcp Not In   LOS: 4 days   Brief Narrative / Interim history: 73 yo WF PMHx  HTN, poorly controlled NIDDM-2 with complication, OSA on CPAP, PAF on Eliquis, gout, breast cancer status post radical left mastectomy and chemo in 1994, ovarian cancer status post chemo hysterectomy and chemo in 2012, and severe AS presenting to [facility] ED with 10/10 chest pain for several days.  Initial labs reveal WBC 18 without lymphopenia. LDH 241. D-Dimer 0.87. BNP 447. Glucose 429. CXR reveals bilatopacities.  Patient exhibited respiratory distress with tachypnea & tachycardia to 140s, and was intubated in the ED on 4/14. She was also started on insulin gtt for glucose >400, and ceftriaxone and azithromycin due to leukocytosis with concerning chest x-ray with bilateral opacities. Cardiology consulted in ED given chest pain.  Echo with EF of 40 to 45%, diffuse hypokinesis, RVSP to 64, mod LAE, mod RAE, mod MS, severe AS and mod AR.  EKG with LBBB (unknown baseline).  Cardiology has been following and planning for TAVR.  Patient will be tested for COVID-19 and was negative.  Patient was extubated on 4/15 and transferred to [facility] on 4/16.  Subjective: No major events overnight of this morning.  No complaints this morning.  She denies chest pain, dyspnea or abdominal pain.  Assessment & Plan: Principal Problem:   Acute hypercapnic respiratory failure (HCC) Active Problems:   DKA (diabetic ketoacidoses) (HCC)   Elevated brain natriuretic peptide (BNP) level   Neutrophilic leukocytosis   Urine incontinence   Severe aortic stenosis   History of septic arthritis   PAF (paroxysmal atrial fibrillation) (HCC)   Ovarian cancer (HCC)   OSA on CPAP   Neuropathy   HTN (hypertension)   History of knee replacement, total, bilateral   H/O total shoulder replacement   Gout   GERD (gastroesophageal reflux disease)   Full dentures  Diabetes ([place])   Breast cancer in female ([place])   Asthma   Lymphedema of left arm   Morbid obesity ([place])   Acute on chronic respiratory failure with hypoxia and hypercapnia (HCC)   Demand ischemia (HCC)   Moderate mitral stenosis   Acute on chronic combined systolic and diastolic CHF (congestive heart failure) ([place])  Acute respiratory failure: likely due to acute CHF exacerbation and pneumonia-resolved.  RVP and COVID-19 negative.  Currently satting at 97% on 2 L by nasal cannula. -Wean oxygen as able. -Manage CHF, AAS and pneumonia as below.  Septic shock: Likely due to pneumonia as above.  Resolved.  Blood cultures negative. -Treat pneumonia as below  Acute systolic CHF exacerbation: Echo findings as above.  Severe aortic stenosis, moderate AR, moderate MS Moderate pulmonary hypertension LBBB on EKG -Fair urine output with net negative on p.o. Lasix.  Renal function stable. -CVP elevated to 30s. -Cardiology managing-plan for TAVR next week -Continue p.o. Lasix 40 mg daily. -Daily weight, intake output and renal function.  Poorly controlled NIDDM-2 with macrovascular complications.  A1c 8.5%. -Continue Levemir 14 units twice daily -Continue sliding scale insulin -CBG monitoring -Continue statin  Paroxysmal atrial fibrillation: Not in RVR.  Mali vascular score 6.  On metoprolol and Eliquis at home. -Continue metoprolol -Eliquis on hold-we will clarify anticoagulation with cardiology.  Hypokalemia/hypomagnesemia: Resolved. -Monitor. -On KCl 40 daily.  OSA -Nightly CPAP  Other chronic medical conditions stable.  Scheduled Meds: . aspirin  81 mg Per Tube Daily  . azithromycin  500 mg Oral Daily  .  chlorhexidine gluconate (MEDLINE KIT)  15 mL Mouth Rinse BID  . enoxaparin (LOVENOX) injection  40 mg Subcutaneous Q24H  . furosemide  40 mg Oral Daily  . insulin aspart  0-15 Units Subcutaneous TID WC  . insulin aspart  0-5 Units Subcutaneous QHS  . insulin detemir  14  Units Subcutaneous BID  . insulin starter kit- pen needles  1 kit Other Once  . living well with diabetes book   Does not apply Once  . metoprolol tartrate  50 mg Oral UD  . potassium chloride  40 mEq Oral Daily  . sodium chloride flush  3 mL Intravenous Q12H   Continuous Infusions: . sodium chloride    . cefTRIAXone (ROCEPHIN)  IV 1 g (06/25/18 1952)   PRN Meds:.sodium chloride, acetaminophen, albuterol, dextrose, fentaNYL (SUBLIMAZE) injection, fentaNYL (SUBLIMAZE) injection, hydrALAZINE, midazolam, midazolam, nitroGLYCERIN, ondansetron (ZOFRAN) IV, sodium chloride flush  DVT prophylaxis: On subcu Lovenox for VTE prophylaxis Code Status: Full code Family Communication: None at bedside Disposition Plan: Remains inpatient for further cardiac evaluation and possible TAVR  Consultants:   Cardiology  Procedures:   ETT 4/14-4/15  Microbiology:  Blood culture on 4/14-negative  RVP on 4/14-negative  COVID-19 on 4/14-negative  Trach aspirate on 4/14-  Antimicrobials:  Azithromycin 4/14--4/18  Cefepime 4/14--4/15  Ceftriaxone 4/16-4/18  Objective: Vitals:   06/25/18 1948 06/25/18 2332 06/26/18 0427 06/26/18 0630  BP: (!) 133/57 129/82 (!) 131/38   Pulse: 86 83 80   Resp: _0 Temp: 98.3 F (36.8 C) 98.6 F (37 C) 98.8 F (37.1 C)   TempSrc: Oral Oral Oral   SpO2: 96% 96% 97%   Weight:    87.2 kg  Height:        Intake/Output Summary (Last 24 hours) at 06/26/2018 1043 Last data filed at 06/26/2018 0429 Gross per 24 hour  Intake 800.29 ml  Output 990 ml  Net -189.71 ml   Filed Weights   06/24/18 0409 06/24/18 1623 06/26/18 0630  Weight: 89.2 kg 87.3 kg 87.2 kg    Examination:  GENERAL: Appears well. No acute distress.  EYES - vision grossly intact. Sclera anicteric.  NOSE- no gross deformity or drainage MOUTH - no oral lesions noted THROAT- no swelling or erythema LUNGS:  No IWOB. Good air movement. CTAB.  HEART: RR with PAC.  3/6 SEM over  RUSB ABD: Bowel sounds present. Soft. Non tender.  MSK/EXT: Moves extremities. No obvious deformity. SKIN: no apparent skin lesion.  NEURO: Awake, alert and oriented appropriately.  No gross deficit.  PSYCH: Calm. Normal affect.   Foley: Intraurethral Foley  Access: Right IJ  Data Reviewed: I have independently reviewed following labs and imaging studies   CBC: Recent Labs  Lab 06/22/18 1905  06/22/18 2343  06/23/18 0438 06/24/18 0344 06/25/18 0640 06/25/18 0833 06/25/18 0837 06/26/18 0620  WBC 18.0*  --  14.1*  --  11.5* 9.9 8.7  --   --  7.0  NEUTROABS 12.2*  --   --   --   --   --   --   --   --   --   HGB 13.7   < > 11.8*   < > 11.8* 12.5 13.0 13.6  13.9 13.6 12.1  HCT 44.4   < > 36.5   < > 37.1 37.9 40.1 40.0  41.0 40.0 37.9  MCV 98.4  --  96.8  --  97.1 95.9 95.7  --   --  94.8  PLT 465*  --  320  --  315 300 301  --   --  285   < > = values in this interval not displayed.   Basic Metabolic Panel: Recent Labs  Lab 06/22/18 1905  06/22/18 2338  06/23/18 0438 06/23/18 0946 06/24/18 0344 06/25/18 0640 06/25/18 0833 06/25/18 0837 06/26/18 0620  NA 135   < > 139   < >  --  139 140 138 140  140 138 140  K 5.1   < > 4.2   < >  --  3.3* 3.8 3.3* 3.6  3.6 3.7 3.9  CL 101  --  106  --   --  102 101 97*  --   --  101  CO2 18*  --  24  --   --  _0 --   --  29  GLUCOSE 429*  --  205*  --   --  179* 146* 174*  --   --  172*  BUN 22  --  22  --   --  _1 --   --  9  CREATININE 1.00  --  0.76  --   --  0.68 0.69 0.77  --   --  0.59  CALCIUM 8.8*  --  7.8*  --   --  8.0* 8.3* 8.7*  --   --  8.8*  MG 1.7  --  1.4*  --  1.3*  --  1.4* 1.6*  --   --  1.7  PHOS 7.6*  --  4.5  --  3.7  --  3.9  --   --   --   --    < > = values in this interval not displayed.   GFR: Estimated Creatinine Clearance: 62.3 mL/min (by C-G formula based on SCr of 0.59 mg/dL). Liver Function Tests: Recent Labs  Lab 06/22/18 1905  AST 48*  ALT 34  ALKPHOS 69  BILITOT 1.2   PROT 7.4  ALBUMIN 4.2   Recent Labs  Lab 06/22/18 1905  LIPASE 23   No results for input(s): AMMONIA in the last 168 hours. Coagulation Profile: Recent Labs  Lab 06/22/18 1935  INR 1.2   Cardiac Enzymes: Recent Labs  Lab 06/22/18 2338 06/23/18 0438 06/23/18 1425 06/24/18 0344  TROPONINI 0.21* 0.85* 0.54* 0.44*   BNP (last 3 results) No results for input(s): PROBNP in the last 8760 hours. HbA1C: No results for input(s): HGBA1C in the last 72 hours. CBG: Recent Labs  Lab 06/24/18 2148 06/25/18 1212 06/25/18 1536 06/25/18 2215 06/26/18 0625  GLUCAP 170* 121* 235* 153* 156*   Lipid Profile: Recent Labs    06/25/18 1223  CHOL 176  HDL 55  LDLCALC 84  TRIG 187*  CHOLHDL 3.2   Thyroid Function Tests: No results for input(s): TSH, T4TOTAL, FREET4, T3FREE, THYROIDAB in the last 72 hours. Anemia Panel: No results for input(s): VITAMINB12, FOLATE, FERRITIN, TIBC, IRON, RETICCTPCT in the last 72 hours. Urine analysis:    Component Value Date/Time   COLORURINE YELLOW 06/22/2018 2140   APPEARANCEUR HAZY (A) 06/22/2018 2140   LABSPEC 1.021 06/22/2018 2140   PHURINE 5.0 06/22/2018 2140   GLUCOSEU >=500 (A) 06/22/2018 2140   HGBUR NEGATIVE 06/22/2018 2140   [place] NEGATIVE 06/22/2018 2140   [place] 06/22/2018 2140   PROTEINUR 100 (A) 06/22/2018 2140   NITRITE NEGATIVE 06/22/2018 2140   LEUKOCYTESUR NEGATIVE 06/22/2018 2140   Sepsis Labs: Invalid input(s): PROCALCITONIN, LACTICIDVEN  Recent Results (from  the past 240 hour(s))  Culture, blood (routine x 2)     Status: None (Preliminary result)   Collection Time: 06/22/18  7:47 PM  Result Value Ref Range Status   Specimen Description BLOOD RIGHT FOREARM  Final   Special Requests   Final    BOTTLES DRAWN AEROBIC AND ANAEROBIC Blood Culture results may not be optimal due to an inadequate volume of blood received in culture bottles   Culture   Final    NO GROWTH 4 DAYS Performed at [facility] [address] [place], [place] [address]    Report Status PENDING  Incomplete  Culture, blood (routine x 2)     Status: None (Preliminary result)   Collection Time: 06/22/18  7:49 PM  Result Value Ref Range Status   Specimen Description BLOOD RIGHT HAND  Final   Special Requests   Final    BOTTLES DRAWN AEROBIC ONLY Blood Culture results may not be optimal due to an excessive volume of blood received in culture bottles   Culture   Final    NO GROWTH 4 DAYS Performed at [facility], [place] [address] [place], [place] [address]    Report Status PENDING  Incomplete  Respiratory Panel by PCR     Status: None   Collection Time: 06/22/18  8:40 PM  Result Value Ref Range Status   Adenovirus NOT DETECTED NOT DETECTED Final   Coronavirus 229E NOT DETECTED NOT DETECTED Final    Comment: (NOTE) The Coronavirus on the Respiratory Panel, DOES NOT test for the novel  Coronavirus (2019 nCoV)    Coronavirus HKU1 NOT DETECTED NOT DETECTED Final   Coronavirus NL63 NOT DETECTED NOT DETECTED Final   Coronavirus OC43 NOT DETECTED NOT DETECTED Final   Metapneumovirus NOT DETECTED NOT DETECTED Final   Rhinovirus / Enterovirus NOT DETECTED NOT DETECTED Final   Influenza A NOT DETECTED NOT DETECTED Final   Influenza B NOT DETECTED NOT DETECTED Final   Parainfluenza Virus 1 NOT DETECTED NOT DETECTED Final   Parainfluenza Virus 2 NOT DETECTED NOT DETECTED Final   Parainfluenza Virus 3 NOT DETECTED NOT DETECTED Final   Parainfluenza Virus 4 NOT DETECTED NOT DETECTED Final   Respiratory Syncytial Virus NOT DETECTED NOT DETECTED Final   Bordetella pertussis NOT DETECTED NOT DETECTED Final   Chlamydophila pneumoniae NOT DETECTED NOT DETECTED Final   Mycoplasma pneumoniae NOT DETECTED NOT DETECTED Final    Comment: Performed at [facility], [place]. [address], [place], [place] [address]  SARS Coronavirus 2 [facility] order, Performed in [facility] hospital lab)     Status: None    Collection Time: 06/22/18  8:40 PM  Result Value Ref Range Status   SARS Coronavirus 2 NEGATIVE NEGATIVE Final    Comment: (NOTE) If result is NEGATIVE SARS-CoV-2 target nucleic acids are NOT DETECTED. The SARS-CoV-2 RNA is generally detectable in upper and lower  respiratory specimens during the acute phase of infection. The lowest  concentration of SARS-CoV-2 viral copies this assay can detect is 250  copies / mL. A negative result does not preclude SARS-CoV-2 infection  and should not be used as the sole basis for treatment or other  patient management decisions.  A negative result may occur with  improper specimen collection / handling, submission of specimen other  than nasopharyngeal swab, presence of viral mutation(s) within the  areas targeted by this assay, and inadequate number of viral copies  (<250 copies / mL). A negative result  must be combined with clinical  observations, patient history, and epidemiological information. If result is POSITIVE SARS-CoV-2 target nucleic acids are DETECTED. The SARS-CoV-2 RNA is generally detectable in upper and lower  respiratory specimens dur ing the acute phase of infection.  Positive  results are indicative of active infection with SARS-CoV-2.  Clinical  correlation with patient history and other diagnostic information is  necessary to determine patient infection status.  Positive results do  not rule out bacterial infection or co-infection with other viruses. If result is PRESUMPTIVE POSTIVE SARS-CoV-2 nucleic acids MAY BE PRESENT.   A presumptive positive result was obtained on the submitted specimen  and confirmed on repeat testing.  While 2019 novel coronavirus  (SARS-CoV-2) nucleic acids may be present in the submitted sample  additional confirmatory testing may be necessary for epidemiological  and / or clinical management purposes  to differentiate between  SARS-CoV-2 and other Sarbecovirus currently known to infect humans.   If clinically indicated additional testing with an alternate test  methodology [phone]) is advised. The SARS-CoV-2 RNA is generally  detectable in upper and lower respiratory sp ecimens during the acute  phase of infection. The expected result is Negative. Fact Sheet for Patients:  StrictlyIdeas.no Fact Sheet for Healthcare Providers: BankingDealers.co.za This test is not yet approved or cleared by the [place] FDA and has been authorized for detection and/or diagnosis of SARS-CoV-2 by FDA under an Emergency Use Authorization (EUA).  This EUA will remain in effect (meaning this test can be used) for the duration of the COVID-19 declaration under Section 564(b)(1) of the Act, 21 U.S.C. section 360bbb-3(b)(1), unless the authorization is terminated or revoked sooner. Performed at [facility], [place] [address], [place], [place] [address]   MRSA PCR Screening     Status: None   Collection Time: 06/22/18 11:25 PM  Result Value Ref Range Status   MRSA by PCR NEGATIVE NEGATIVE Final    Comment:        The GeneXpert MRSA Assay (FDA approved for NASAL specimens only), is one component of a comprehensive MRSA colonization surveillance program. It is not intended to diagnose MRSA infection nor to guide or monitor treatment for MRSA infections. Performed at [facility], [place] [address] [place], [place] [address]       Radiology Studies: No results found.  [place] [facility] [facility] Pager [phone]  If 7PM-7AM, please contact night-coverage [email or web address] Password [facility] 06/26/2018, 10:43 AM

## 2018-06-27 DIAGNOSIS — I480 Paroxysmal atrial fibrillation: Secondary | ICD-10-CM

## 2018-06-27 LAB — CULTURE, BLOOD (ROUTINE X 2)
Culture: NO GROWTH
Culture: NO GROWTH

## 2018-06-27 LAB — MAGNESIUM: Magnesium: 1.6 mg/dL — ABNORMAL LOW (ref 1.7–2.4)

## 2018-06-27 LAB — CBC
HCT: 38.6 % (ref 36.0–46.0)
Hemoglobin: 12.6 g/dL (ref 12.0–15.0)
MCH: 31.4 pg (ref 26.0–34.0)
MCHC: 32.6 g/dL (ref 30.0–36.0)
MCV: 96.3 fL (ref 80.0–100.0)
Platelets: 318 10*3/uL (ref 150–400)
RBC: 4.01 MIL/uL (ref 3.87–5.11)
RDW: 14.3 % (ref 11.5–15.5)
WBC: 8.7 10*3/uL (ref 4.0–10.5)
nRBC: 0 % (ref 0.0–0.2)

## 2018-06-27 LAB — GLUCOSE, CAPILLARY
Glucose-Capillary: 189 mg/dL — ABNORMAL HIGH (ref 70–99)
Glucose-Capillary: 172 mg/dL — ABNORMAL HIGH (ref 70–99)
Glucose-Capillary: 149 mg/dL — ABNORMAL HIGH (ref 70–99)
Glucose-Capillary: 201 mg/dL — ABNORMAL HIGH (ref 70–99)

## 2018-06-27 LAB — BASIC METABOLIC PANEL
Anion gap: 12 (ref 5–15)
BUN: 11 mg/dL (ref 8–23)
CO2: 29 mmol/L (ref 22–32)
Calcium: 9.3 mg/dL (ref 8.9–10.3)
Chloride: 98 mmol/L (ref 98–111)
Creatinine, Ser: 0.7 mg/dL (ref 0.44–1.00)
GFR calc Af Amer: >60 mL/min (ref >60)
GFR calc non Af Amer: >60 mL/min (ref >60)
Glucose, Bld: 171 mg/dL — ABNORMAL HIGH (ref 70–99)
Potassium: 4.4 mmol/L (ref 3.5–5.1)
Sodium: 139 mmol/L (ref 135–145)

## 2018-06-27 MED ORDER — MAGNESIUM SULFATE 2 GM/50ML IV SOLN
2.0000 g | Freq: Once | INTRAVENOUS | Status: AC
  Administered 2018-06-27: 07:00:00 2 g via INTRAVENOUS
  Filled 2018-06-27: qty 50

## 2018-06-27 NOTE — Progress Notes (Signed)
Foley catheter removed per verbal ordre by Dr. Cyndia Skeeters, MD.  Pt tolerated procedure well.  Due to void.

## 2018-06-27 NOTE — Progress Notes (Signed)
RT offered pt CPAP for the night for third night and pt declined stating she can not tolerate. RT will continue to monitor.

## 2018-06-27 NOTE — Progress Notes (Signed)
PROGRESS NOTE  Tanya [name] EPP:[id number] DOB: 07/25/1945 DOA: 06/22/2018 PCP: System, Pcp Not In   LOS: 5 days   Brief Narrative / Interim history: 73 yo WF PMHx  HTN, poorly controlled NIDDM-2 with complication, OSA on CPAP, PAF on Eliquis, gout, breast cancer status post radical left mastectomy and chemo in 1994, ovarian cancer status post chemo hysterectomy and chemo in 2012, and severe AS presenting to [facility] ED with 10/10 chest pain for several days.  Initial labs reveal WBC 18 without lymphopenia. LDH 241. D-Dimer 0.87. BNP 447. Glucose 429. CXR reveals bilatopacities.  Patient exhibited respiratory distress with tachypnea & tachycardia to 140s, and was intubated in the ED on 4/14. She was also started on insulin gtt for glucose >400, and ceftriaxone and azithromycin due to leukocytosis with concerning chest x-ray with bilateral opacities. Cardiology consulted in ED given chest pain.  Echo with EF of 40 to 45%, diffuse hypokinesis, RVSP to 64, mod LAE, mod RAE, mod MS, severe AS and mod AR.  EKG with LBBB (unknown baseline).  Cardiology has been following and planning for TAVR.  Patient will be tested for COVID-19 and was negative.  Patient was extubated on 4/15 and transferred to [facility] on 4/16.  Subjective: No major events overnight of this morning.  No complaints this morning.  She denies chest pain, dyspnea or abdominal pain. No major events overnight of this morning. Sitting on the edge of the bed eating breakfast. She says she had brief episode of cough at midnight that has resolved. She denies chest pain, dyspnea or abdominal pain.    Assessment & Plan: Principal Problem:   Acute on chronic combined systolic and diastolic CHF (congestive heart failure) (HCC) Active Problems:   Acute hypercapnic respiratory failure (HCC)   DKA (diabetic ketoacidoses) (HCC)   Elevated brain natriuretic peptide (BNP) level   Neutrophilic leukocytosis   Urine incontinence   Severe aortic stenosis    PAF (paroxysmal atrial fibrillation) (HCC)   OSA on CPAP   Neuropathy   HTN (hypertension)   Diabetes (HCC)   Asthma   Lymphedema of left arm   Morbid obesity (HCC)   Acute on chronic respiratory failure with hypoxia and hypercapnia (HCC)   Demand ischemia (HCC)   Moderate mitral stenosis   Moderate pulmonary arterial systolic hypertension (HCC)  Acute respiratory failure: likely due to acute CHF exacerbation and pneumonia-resolved.  RVP and COVID-19 negative.  Currently satting at 97% on 2 L by nasal cannula. -Asked RN to wean oxygen. -PRN Xopenex for cough. -Manage CHF, AAS and pneumonia as below.  Septic shock: Resolved.  Likely due to pneumonia as above. Blood cultures negative. -Treat pneumonia as below  Acute systolic CHF exacerbation: Echo findings as above.  Severe aortic stenosis, moderate AR, moderate MS Moderate pulmonary hypertension LBBB on EKG -Continue p.o. Lasix 40 mg daily. -Excellent urine output with net negative on p.o. Lasix.  Renal function stable.  -Replenish electrolytes. -Cardiology managing-plan for TAVR early next week -Daily weight, intake output and renal function. -Follow CT angiogram and CCTA results. -DC Foley.  Start pure wick.  Bladder scan  Poorly controlled NIDDM-2 with macrovascular complications.  A1c 8.5%. -Continue Levemir 14 units twice daily -Continue sliding scale insulin -CBG monitoring -Continue statin  Paroxysmal atrial fibrillation: Not in RVR.  Mali vascular score 6.  On metoprolol and Eliquis at home. -Continue metoprolol -Eliquis on hold for possible procedure-cardiology aware.  Hypokalemia/hypomagnesemia: -On KCl 40 daily. -Replenish and recheck  OSA -Nightly CPAP  Other  chronic medical conditions stable.  Scheduled Meds: . aspirin  81 mg Per Tube Daily  . chlorhexidine gluconate (MEDLINE KIT)  15 mL Mouth Rinse BID  . enoxaparin (LOVENOX) injection  40 mg Subcutaneous Q24H  . furosemide  40 mg Oral Daily   . insulin aspart  0-15 Units Subcutaneous TID WC  . insulin aspart  0-5 Units Subcutaneous QHS  . insulin detemir  14 Units Subcutaneous BID  . insulin starter kit- pen needles  1 kit Other Once  . living well with diabetes book   Does not apply Once  . metoprolol tartrate  50 mg Oral UD  . polyethylene glycol  17 g Oral Daily  . potassium chloride  40 mEq Oral Daily  . sodium chloride flush  3 mL Intravenous Q12H   Continuous Infusions: . sodium chloride 250 mL (06/26/18 2202)   PRN Meds:.sodium chloride, acetaminophen, albuterol, dextrose, hydrALAZINE, nitroGLYCERIN, ondansetron (ZOFRAN) IV, sodium chloride flush  DVT prophylaxis: On subcu Lovenox for VTE prophylaxis Code Status: Full code Family Communication: None at bedside Disposition Plan: Remains inpatient for further cardiac evaluation and possible TAVR  Consultants:   Cardiology  Procedures:   ETT 4/14-4/15  Microbiology:  Blood culture on 4/14-negative  RVP on 4/14-negative  COVID-19 on 4/14-negative  Trach aspirate on 4/14-  Antimicrobials:  Azithromycin 4/14--4/18  Cefepime 4/14--4/15  Ceftriaxone 4/16-4/18  Objective: Vitals:   06/26/18 2029 06/27/18 0402 06/27/18 0423 06/27/18 1107  BP: (!) 134/52 119/63  121/66  Pulse:  83  74  Resp: (!) 22 (!) 22  (!) 22  Temp:  98.8 F (37.1 C)  98.4 F (36.9 C)  TempSrc:  Oral  Oral  SpO2:  98%  96%  Weight:  86.3 kg 86.9 kg   Height:        Intake/Output Summary (Last 24 hours) at 06/27/2018 1240 Last data filed at 06/27/2018 0932 Gross per 24 hour  Intake 1170 ml  Output 2450 ml  Net -1280 ml   Filed Weights   06/26/18 0630 06/27/18 0402 06/27/18 0423  Weight: 87.2 kg 86.3 kg 86.9 kg    Examination: GENERAL: Appears well. No acute distress.  HEENT: MMM.  Vision and Hearing grossly intact.  NECK: Supple.  No apparent JVD. LUNGS:  No IWOB. Good air movement. CTAB.  HEART:  RRR.  3/6 SEM over RUSB. ABD: Bowel sounds present. Soft. Non  tender.  EXT:   no edema bilaterally.  SKIN: no apparent skin lesion.  NEURO: Awake, alert and oriented appropriately.  No gross deficit.  PSYCH: Calm. Normal affect.  Foley: Intraurethral Foley  Access: Right IJ  Data Reviewed: I have independently reviewed following labs and imaging studies   CBC: Recent Labs  Lab 06/22/18 1905  06/23/18 0438 06/24/18 0344 06/25/18 0640 06/25/18 0833 06/25/18 0837 06/26/18 0620 06/27/18 0526  WBC 18.0*   < > 11.5* 9.9 8.7  --   --  7.0 8.7  NEUTROABS 12.2*  --   --   --   --   --   --   --   --   HGB 13.7   < > 11.8* 12.5 13.0 13.6  13.9 13.6 12.1 12.6  HCT 44.4   < > 37.1 37.9 40.1 40.0  41.0 40.0 37.9 38.6  MCV 98.4   < > 97.1 95.9 95.7  --   --  94.8 96.3  PLT 465*   < > 315 300 301  --   --  285 318   < > =  values in this interval not displayed.   Basic Metabolic Panel: Recent Labs  Lab 06/22/18 1905  06/22/18 2338  06/23/18 0438 06/23/18 0946 06/24/18 0344 06/25/18 0640 06/25/18 0833 06/25/18 0837 06/26/18 0620 06/27/18 0526  NA 135   < > 139   < >  --  139 140 138 140  140 138 140 139  K 5.1   < > 4.2   < >  --  3.3* 3.8 3.3* 3.6  3.6 3.7 3.9 4.4  CL 101  --  106  --   --  102 101 97*  --   --  101 98  CO2 18*  --  24  --   --  '27 27 30  ' --   --  29 29  GLUCOSE 429*  --  205*  --   --  179* 146* 174*  --   --  172* 171*  BUN 22  --  22  --   --  '15 15 14  ' --   --  9 11  CREATININE 1.00  --  0.76  --   --  0.68 0.69 0.77  --   --  0.59 0.70  CALCIUM 8.8*  --  7.8*  --   --  8.0* 8.3* 8.7*  --   --  8.8* 9.3  MG 1.7  --  1.4*  --  1.3*  --  1.4* 1.6*  --   --  1.7 1.6*  PHOS 7.6*  --  4.5  --  3.7  --  3.9  --   --   --   --   --    < > = values in this interval not displayed.   GFR: Estimated Creatinine Clearance: 62.2 mL/min (by C-G formula based on SCr of 0.7 mg/dL). Liver Function Tests: Recent Labs  Lab 06/22/18 1905  AST 48*  ALT 34  ALKPHOS 69  BILITOT 1.2  PROT 7.4  ALBUMIN 4.2   Recent Labs  Lab  06/22/18 1905  LIPASE 23   No results for input(s): AMMONIA in the last 168 hours. Coagulation Profile: Recent Labs  Lab 06/22/18 1935  INR 1.2   Cardiac Enzymes: Recent Labs  Lab 06/22/18 2338 06/23/18 0438 06/23/18 1425 06/24/18 0344  TROPONINI 0.21* 0.85* 0.54* 0.44*   BNP (last 3 results) No results for input(s): PROBNP in the last 8760 hours. HbA1C: No results for input(s): HGBA1C in the last 72 hours. CBG: Recent Labs  Lab 06/26/18 1201 06/26/18 1625 06/26/18 2144 06/27/18 0630 06/27/18 1117  GLUCAP 138* 158* 286* 189* 172*   Lipid Profile: Recent Labs    06/25/18 1223  CHOL 176  HDL 55  LDLCALC 84  TRIG 187*  CHOLHDL 3.2   Thyroid Function Tests: No results for input(s): TSH, T4TOTAL, FREET4, T3FREE, THYROIDAB in the last 72 hours. Anemia Panel: No results for input(s): VITAMINB12, FOLATE, FERRITIN, TIBC, IRON, RETICCTPCT in the last 72 hours. Urine analysis:    Component Value Date/Time   COLORURINE YELLOW 06/22/2018 2140   APPEARANCEUR HAZY (A) 06/22/2018 2140   LABSPEC 1.021 06/22/2018 2140   PHURINE 5.0 06/22/2018 2140   GLUCOSEU >=500 (A) 06/22/2018 2140   HGBUR NEGATIVE 06/22/2018 2140   [place] NEGATIVE 06/22/2018 2140   Laura 06/22/2018 2140   PROTEINUR 100 (A) 06/22/2018 2140   NITRITE NEGATIVE 06/22/2018 2140   LEUKOCYTESUR NEGATIVE 06/22/2018 2140   Sepsis Labs: Invalid input(s): PROCALCITONIN, LACTICIDVEN  Recent Results (from the past 240 hour(s))  Culture, blood (routine x 2)     Status: None   Collection Time: 06/22/18  7:47 PM  Result Value Ref Range Status   Specimen Description BLOOD RIGHT FOREARM  Final   Special Requests   Final    BOTTLES DRAWN AEROBIC AND ANAEROBIC Blood Culture results may not be optimal due to an inadequate volume of blood received in culture bottles   Culture   Final    NO GROWTH 5 DAYS Performed at [facility], [place] [address], [place], [place] [address]    Report  Status 06/27/2018 FINAL  Final  Culture, blood (routine x 2)     Status: None   Collection Time: 06/22/18  7:49 PM  Result Value Ref Range Status   Specimen Description BLOOD RIGHT HAND  Final   Special Requests   Final    BOTTLES DRAWN AEROBIC ONLY Blood Culture results may not be optimal due to an excessive volume of blood received in culture bottles   Culture   Final    NO GROWTH 5 DAYS Performed at [facility], [place] [address] [place], [place] [address]    Report Status 06/27/2018 FINAL  Final  Respiratory Panel by PCR     Status: None   Collection Time: 06/22/18  8:40 PM  Result Value Ref Range Status   Adenovirus NOT DETECTED NOT DETECTED Final   Coronavirus 229E NOT DETECTED NOT DETECTED Final    Comment: (NOTE) The Coronavirus on the Respiratory Panel, DOES NOT test for the novel  Coronavirus (2019 nCoV)    Coronavirus HKU1 NOT DETECTED NOT DETECTED Final   Coronavirus NL63 NOT DETECTED NOT DETECTED Final   Coronavirus OC43 NOT DETECTED NOT DETECTED Final   Metapneumovirus NOT DETECTED NOT DETECTED Final   Rhinovirus / Enterovirus NOT DETECTED NOT DETECTED Final   Influenza A NOT DETECTED NOT DETECTED Final   Influenza B NOT DETECTED NOT DETECTED Final   Parainfluenza Virus 1 NOT DETECTED NOT DETECTED Final   Parainfluenza Virus 2 NOT DETECTED NOT DETECTED Final   Parainfluenza Virus 3 NOT DETECTED NOT DETECTED Final   Parainfluenza Virus 4 NOT DETECTED NOT DETECTED Final   Respiratory Syncytial Virus NOT DETECTED NOT DETECTED Final   Bordetella pertussis NOT DETECTED NOT DETECTED Final   Chlamydophila pneumoniae NOT DETECTED NOT DETECTED Final   Mycoplasma pneumoniae NOT DETECTED NOT DETECTED Final    Comment: Performed at [facility], [place]. [address], [place], [place] [address]  SARS Coronavirus 2 [facility] order, Performed in [facility] hospital lab)     Status: None   Collection Time: 06/22/18  8:40 PM  Result Value Ref Range Status   SARS Coronavirus  2 NEGATIVE NEGATIVE Final    Comment: (NOTE) If result is NEGATIVE SARS-CoV-2 target nucleic acids are NOT DETECTED. The SARS-CoV-2 RNA is generally detectable in upper and lower  respiratory specimens during the acute phase of infection. The lowest  concentration of SARS-CoV-2 viral copies this assay can detect is 250  copies / mL. A negative result does not preclude SARS-CoV-2 infection  and should not be used as the sole basis for treatment or other  patient management decisions.  A negative result may occur with  improper specimen collection / handling, submission of specimen other  than nasopharyngeal swab, presence of viral mutation(s) within the  areas targeted by this assay, and inadequate number of viral copies  (<250 copies / mL). A negative result must be combined with clinical  observations,  patient history, and epidemiological information. If result is POSITIVE SARS-CoV-2 target nucleic acids are DETECTED. The SARS-CoV-2 RNA is generally detectable in upper and lower  respiratory specimens dur ing the acute phase of infection.  Positive  results are indicative of active infection with SARS-CoV-2.  Clinical  correlation with patient history and other diagnostic information is  necessary to determine patient infection status.  Positive results do  not rule out bacterial infection or co-infection with other viruses. If result is PRESUMPTIVE POSTIVE SARS-CoV-2 nucleic acids MAY BE PRESENT.   A presumptive positive result was obtained on the submitted specimen  and confirmed on repeat testing.  While 2019 novel coronavirus  (SARS-CoV-2) nucleic acids may be present in the submitted sample  additional confirmatory testing may be necessary for epidemiological  and / or clinical management purposes  to differentiate between  SARS-CoV-2 and other Sarbecovirus currently known to infect humans.  If clinically indicated additional testing with an alternate test  methodology  [phone]) is advised. The SARS-CoV-2 RNA is generally  detectable in upper and lower respiratory sp ecimens during the acute  phase of infection. The expected result is Negative. Fact Sheet for Patients:  StrictlyIdeas.no Fact Sheet for Healthcare Providers: BankingDealers.co.za This test is not yet approved or cleared by the [place] FDA and has been authorized for detection and/or diagnosis of SARS-CoV-2 by FDA under an Emergency Use Authorization (EUA).  This EUA will remain in effect (meaning this test can be used) for the duration of the COVID-19 declaration under Section 564(b)(1) of the Act, 21 U.S.C. section 360bbb-3(b)(1), unless the authorization is terminated or revoked sooner. Performed at [facility], [place] [address] [place], [place] [address]   MRSA PCR Screening     Status: None   Collection Time: 06/22/18 11:25 PM  Result Value Ref Range Status   MRSA by PCR NEGATIVE NEGATIVE Final    Comment:        The GeneXpert MRSA Assay (FDA approved for NASAL specimens only), is one component of a comprehensive MRSA colonization surveillance program. It is not intended to diagnose MRSA infection nor to guide or monitor treatment for MRSA infections. Performed at [facility], [place] [address] [place], [place] [address]       Radiology Studies: No results found.   T. [facility] Pager [phone]  If 7PM-7AM, please contact night-coverage [email or web address] Password [facility] 06/27/2018, 12:40 PM

## 2018-06-27 NOTE — Progress Notes (Signed)
Progress Note  Patient Name: Tanya [name] Date of Encounter: 06/27/2018  Primary Cardiologist: Dr. Dani Gobble Croitoru  Subjective   No chest pain or breathlessness at rest.  Intermittent cough.  Stable appetite.  Inpatient Medications    Scheduled Meds: . aspirin  81 mg Per Tube Daily  . chlorhexidine gluconate (MEDLINE KIT)  15 mL Mouth Rinse BID  . enoxaparin (LOVENOX) injection  40 mg Subcutaneous Q24H  . furosemide  40 mg Oral Daily  . insulin aspart  0-15 Units Subcutaneous TID WC  . insulin aspart  0-5 Units Subcutaneous QHS  . insulin detemir  14 Units Subcutaneous BID  . insulin starter kit- pen needles  1 kit Other Once  . living well with diabetes book   Does not apply Once  . metoprolol tartrate  50 mg Oral UD  . polyethylene glycol  17 g Oral Daily  . potassium chloride  40 mEq Oral Daily  . sodium chloride flush  3 mL Intravenous Q12H   Continuous Infusions: . sodium chloride 250 mL (06/26/18 2202)   PRN Meds: sodium chloride, acetaminophen, albuterol, dextrose, hydrALAZINE, nitroGLYCERIN, ondansetron (ZOFRAN) IV, sodium chloride flush   Vital Signs    Vitals:   06/26/18 2024 06/26/18 2029 06/27/18 0402 06/27/18 0423  BP: (!) 134/52 (!) 134/52 119/63   Pulse: 76  83   Resp: 20 (!) 22 (!) 22   Temp: 98.6 F (37 C)  98.8 F (37.1 C)   TempSrc: Oral  Oral   SpO2: 97%  98%   Weight:   86.3 kg 86.9 kg  Height:        Intake/Output Summary (Last 24 hours) at 06/27/2018 0946 Last data filed at 06/27/2018 0932 Gross per 24 hour  Intake 1170 ml  Output 2450 ml  Net -1280 ml   Filed Weights   06/26/18 0630 06/27/18 0402 06/27/18 0423  Weight: 87.2 kg 86.3 kg 86.9 kg    Telemetry    Sinus rhythm.  Personally reviewed.  ECG    Follow-up tracing pending.  Physical Exam   GEN: No acute distress.   Neck: No JVD. Cardiac: RRR, 3/6 harsh systolic murmur consistent with AS, no gallop.  Respiratory: Nonlabored.  No wheezing or rhonchi. GI: Soft,  nontender, bowel sounds present. MS: No edema; No deformity. Neuro:  Nonfocal. Psych: Alert and oriented x 3. Normal affect.  Labs    Chemistry Recent Labs  Lab 06/22/18 1905  06/25/18 0640  06/25/18 0837 06/26/18 0620 06/27/18 0526  NA 135   < > 138   < > 138 140 139  K 5.1   < > 3.3*   < > 3.7 3.9 4.4  CL 101   < > 97*  --   --  101 98  CO2 18*   < > 30  --   --  29 29  GLUCOSE 429*   < > 174*  --   --  172* 171*  BUN 22   < > 14  --   --  9 11  CREATININE 1.00   < > 0.77  --   --  0.59 0.70  CALCIUM 8.8*   < > 8.7*  --   --  8.8* 9.3  PROT 7.4  --   --   --   --   --   --   ALBUMIN 4.2  --   --   --   --   --   --   AST 48*  --   --   --   --   --   --  ALT 34  --   --   --   --   --   --   ALKPHOS 69  --   --   --   --   --   --   BILITOT 1.2  --   --   --   --   --   --   GFRNONAA 55*   < > >60  --   --  >60 >60  GFRAA >60   < > >60  --   --  >60 >60  ANIONGAP 16*   < > 11  --   --  10 12   < > = values in this interval not displayed.     Hematology Recent Labs  Lab 06/25/18 0640  06/25/18 0837 06/26/18 0620 06/27/18 0526  WBC 8.7  --   --  7.0 8.7  RBC 4.19  --   --  4.00 4.01  HGB 13.0   < > 13.6 12.1 12.6  HCT 40.1   < > 40.0 37.9 38.6  MCV 95.7  --   --  94.8 96.3  MCH 31.0  --   --  30.3 31.4  MCHC 32.4  --   --  31.9 32.6  RDW 14.2  --   --  14.1 14.3  PLT 301  --   --  285 318   < > = values in this interval not displayed.    Cardiac Enzymes Recent Labs  Lab 06/22/18 2338 06/23/18 0438 06/23/18 1425 06/24/18 0344  TROPONINI 0.21* 0.85* 0.54* 0.44*    Recent Labs  Lab 06/22/18 1857  TROPIPOC 0.02     BNP Recent Labs  Lab 06/22/18 1905  BNP 447.3*     DDimer  Recent Labs  Lab 06/22/18 1935  DDIMER 0.87*     Radiology    No results found.  Cardiac Studies   Cardiac catheterization 06/25/2018:  Left dominant coronary anatomy with normal coronary arteries.  Severe aortic stenosis.  Moderate mitral stenosis.   Normal pulmonary artery pressures  Normal filling pressures.LVEDP 13 mmHg and pulmonary capillary wedge mean 15 mmHg.  Severe mitral annular calcification and to a lesser extent aortic valve calcification on cine fluoroscopy.  RECOMMENDATIONS:   Discussed with Dr. Sherren Mocha.  Consideration is being given to TAVR.  Echocardiogram 06/23/2018: 1. The left ventricle has mild-moderately reduced systolic function, with an ejection fraction of 40-45%. The cavity size was normal. Left ventricular diastolic function could not be evaluated. Left ventricular diffuse hypokinesis. 2. The right ventricle has normal systolic function. The cavity was normal. There is no increase in right ventricular wall thickness. Right ventricular systolic pressure is moderately elevated with an estimated pressure of 64.6 mmHg. 3. Left atrial size was moderately dilated. 4. Right atrial size was moderately dilated. 5. The mitral valve is degenerative. Mild thickening of the mitral valve leaflet. There is moderate mitral annular calcification present. Mitral valve regurgitation is moderate by color flow Doppler. Moderate mitral valve stenosis. 6. The aortic valve is tricuspid. Severely thickening of the aortic valve. Severe calcifcation of the aortic valve. Aortic valve regurgitation is moderate by color flow Doppler. Severe stenosis of the aortic valve. 7. Pulmonary hypertension is moderate.  Patient Profile     73 y.o. female presenting with acute on chronic combined heart failure associated with acute on chronic hypoxic and hypercapnic respiratory failure in the setting of severe aortic stenosis and moderate mitral stenosis.  Assessment & Plan    1.  Severe aortic stenosis.  Patient has been evaluated by Dr. Burt Knack and Dr. Roxy Manns with plan for TAVR on Tuesday.  2.  Demand ischemia with peak troponin I 0.85.  Normal coronary arteries documented at cardiac catheterization on April 17.  3.  Moderate  mitral stenosis.  4.  Chronic combined heart failure with recent acute exacerbation, volume status improved and currently on oral Lasix.  5.  Paroxysmal atrial fibrillation, maintaining sinus rhythm.  Eliquis is on hold.  Continue aspirin, oral Lasix with supplements, DVT prophylaxis dose Lovenox, and Lopressor.  Follow-up ECG pending.  Signed, Rozann Lesches, MD  06/27/2018, 9:46 AM

## 2018-06-28 ENCOUNTER — Encounter (HOSPITAL_COMMUNITY): Payer: Self-pay | Admitting: Physician Assistant

## 2018-06-28 DIAGNOSIS — R918 Other nonspecific abnormal finding of lung field: Secondary | ICD-10-CM | POA: Diagnosis present

## 2018-06-28 DIAGNOSIS — I4891 Unspecified atrial fibrillation: Secondary | ICD-10-CM

## 2018-06-28 LAB — URINALYSIS, ROUTINE W REFLEX MICROSCOPIC
Bacteria, UA: NONE SEEN
Bilirubin Urine: NEGATIVE
Glucose, UA: NEGATIVE mg/dL
Hgb urine dipstick: NEGATIVE
Ketones, ur: NEGATIVE mg/dL
Nitrite: NEGATIVE
Protein, ur: NEGATIVE mg/dL
Specific Gravity, Urine: 1.017 (ref 1.005–1.030)
pH: 5 (ref 5.0–8.0)

## 2018-06-28 LAB — BASIC METABOLIC PANEL
Anion gap: 7 (ref 5–15)
BUN: 13 mg/dL (ref 8–23)
CO2: 31 mmol/L (ref 22–32)
Calcium: 9.4 mg/dL (ref 8.9–10.3)
Chloride: 98 mmol/L (ref 98–111)
Creatinine, Ser: 0.64 mg/dL (ref 0.44–1.00)
GFR calc Af Amer: >60 mL/min (ref >60)
GFR calc non Af Amer: >60 mL/min (ref >60)
Glucose, Bld: 167 mg/dL — ABNORMAL HIGH (ref 70–99)
Potassium: 4.1 mmol/L (ref 3.5–5.1)
Sodium: 136 mmol/L (ref 135–145)

## 2018-06-28 LAB — GLUCOSE, CAPILLARY
Glucose-Capillary: 206 mg/dL — ABNORMAL HIGH (ref 70–99)
Glucose-Capillary: 272 mg/dL — ABNORMAL HIGH (ref 70–99)
Glucose-Capillary: 203 mg/dL — ABNORMAL HIGH (ref 70–99)

## 2018-06-28 LAB — TYPE AND SCREEN
ABO/RH(D): O POS
Antibody Screen: NEGATIVE

## 2018-06-28 LAB — CBC
HCT: 37.2 % (ref 36.0–46.0)
Hemoglobin: 12.3 g/dL (ref 12.0–15.0)
MCH: 31.2 pg (ref 26.0–34.0)
MCHC: 33.1 g/dL (ref 30.0–36.0)
MCV: 94.4 fL (ref 80.0–100.0)
Platelets: 296 10*3/uL (ref 150–400)
RBC: 3.94 MIL/uL (ref 3.87–5.11)
RDW: 14 % (ref 11.5–15.5)
WBC: 7 10*3/uL (ref 4.0–10.5)
nRBC: 0 % (ref 0.0–0.2)

## 2018-06-28 LAB — PROTIME-INR
INR: 1 (ref 0.8–1.2)
Prothrombin Time: 13.2 seconds (ref 11.4–15.2)

## 2018-06-28 LAB — HEPARIN LEVEL (UNFRACTIONATED)
Heparin Unfractionated: 0.4 IU/mL (ref 0.30–0.70)
Heparin Unfractionated: 0.36 IU/mL (ref 0.30–0.70)

## 2018-06-28 LAB — BLOOD GAS, ARTERIAL
Acid-Base Excess: 1.2 mmol/L (ref 0.0–2.0)
Bicarbonate: 25.3 mmol/L (ref 20.0–28.0)
Drawn by: 27011
FIO2: 2
O2 Saturation: 98.1 %
Patient temperature: 98.6
pCO2 arterial: 40.4 mmHg (ref 32.0–48.0)
pH, Arterial: 7.413 (ref 7.350–7.450)
pO2, Arterial: 109 mmHg — ABNORMAL HIGH (ref 83.0–108.0)

## 2018-06-28 LAB — TSH: TSH: 3.436 u[IU]/mL (ref 0.350–4.500)

## 2018-06-28 LAB — MAGNESIUM: Magnesium: 1.6 mg/dL — ABNORMAL LOW (ref 1.7–2.4)

## 2018-06-28 LAB — T4, FREE: Free T4: 0.76 ng/dL — ABNORMAL LOW (ref 0.82–1.77)

## 2018-06-28 MED ORDER — METOPROLOL TARTRATE 50 MG PO TABS: 50.0000 mg | ORAL_TABLET | ORAL | Status: DC

## 2018-06-28 MED ORDER — CHLORHEXIDINE GLUCONATE 0.12 % MT SOLN
15.0000 mL | Freq: Once | OROMUCOSAL | Status: AC
  Administered 2018-06-29: 06:00:00 15 mL via OROMUCOSAL
  Filled 2018-06-28: qty 15

## 2018-06-28 MED ORDER — NOREPINEPHRINE BITARTRATE 1 MG/ML IV SOLN
0.0000 ug/min | INTRAVENOUS | Status: DC
  Filled 2018-06-28: qty 4

## 2018-06-28 MED ORDER — AMIODARONE HCL IN DEXTROSE 360-4.14 MG/200ML-% IV SOLN
30.0000 mg/h | INTRAVENOUS | Status: DC
  Administered 2018-06-28: 30 mg/h via INTRAVENOUS
  Filled 2018-06-28 (×2): qty 200

## 2018-06-28 MED ORDER — CHLORHEXIDINE GLUCONATE 4 % EX LIQD
1.0000 "application " | Freq: Once | CUTANEOUS | Status: AC
  Administered 2018-06-28: 1 via TOPICAL
  Filled 2018-06-28: qty 60

## 2018-06-28 MED ORDER — DEXMEDETOMIDINE HCL IN NACL 400 MCG/100ML IV SOLN
0.1000 ug/kg/h | INTRAVENOUS | Status: DC
  Filled 2018-06-28: qty 100

## 2018-06-28 MED ORDER — SODIUM CHLORIDE 0.9 % IV SOLN
INTRAVENOUS | Status: DC
  Filled 2018-06-28: qty 30

## 2018-06-28 MED ORDER — HEPARIN BOLUS VIA INFUSION
3000.0000 [IU] | Freq: Once | INTRAVENOUS | Status: AC
  Administered 2018-06-28: 3000 [IU] via INTRAVENOUS
  Filled 2018-06-28: qty 3000

## 2018-06-28 MED ORDER — AMIODARONE HCL 200 MG PO TABS: 200.0000 mg | ORAL_TABLET | Freq: Every day | ORAL | Status: DC

## 2018-06-28 MED ORDER — DOXYCYCLINE HYCLATE 100 MG PO TABS
100.0000 mg | ORAL_TABLET | Freq: Two times a day (BID) | ORAL | Status: DC
  Administered 2018-06-28 – 2018-06-30 (×4): 100 mg via ORAL
  Filled 2018-06-28 (×4): qty 1

## 2018-06-28 MED ORDER — HEPARIN (PORCINE) 25000 UT/250ML-% IV SOLN
1150.0000 [IU]/h | INTRAVENOUS | Status: DC
  Administered 2018-06-28 (×2): 1150 [IU]/h via INTRAVENOUS
  Filled 2018-06-28 (×2): qty 250

## 2018-06-28 MED ORDER — BISACODYL 5 MG PO TBEC
5.0000 mg | DELAYED_RELEASE_TABLET | Freq: Once | ORAL | Status: AC
  Administered 2018-06-28: 5 mg via ORAL
  Filled 2018-06-28: qty 1

## 2018-06-28 MED ORDER — MAGNESIUM SULFATE 2 GM/50ML IV SOLN
2.0000 g | Freq: Once | INTRAVENOUS | Status: AC
  Administered 2018-06-28: 2 g via INTRAVENOUS
  Filled 2018-06-28: qty 50

## 2018-06-28 MED ORDER — AMIODARONE HCL IN DEXTROSE 360-4.14 MG/200ML-% IV SOLN
60.0000 mg/h | INTRAVENOUS | Status: DC
  Filled 2018-06-28: qty 200

## 2018-06-28 MED ORDER — METOPROLOL TARTRATE 50 MG PO TABS
50.0000 mg | ORAL_TABLET | ORAL | Status: AC
  Administered 2018-06-28: 50 mg via ORAL

## 2018-06-28 MED ORDER — AMIODARONE HCL IN DEXTROSE 360-4.14 MG/200ML-% IV SOLN
30.0000 mg/h | INTRAVENOUS | Status: DC
  Administered 2018-06-28 – 2018-06-30 (×3): 30 mg/h via INTRAVENOUS
  Filled 2018-06-28 (×5): qty 200

## 2018-06-28 MED ORDER — AMIODARONE IV BOLUS ONLY 150 MG/100ML
150.0000 mg | Freq: Once | INTRAVENOUS | Status: AC
  Administered 2018-06-28: 150 mg via INTRAVENOUS
  Filled 2018-06-28: qty 100

## 2018-06-28 MED ORDER — TEMAZEPAM 7.5 MG PO CAPS: 15.0000 mg | ORAL_CAPSULE | Freq: Once | ORAL | Status: DC | PRN

## 2018-06-28 MED ORDER — VANCOMYCIN HCL 10 G IV SOLR
1500.0000 mg | INTRAVENOUS | Status: AC
  Administered 2018-06-29: 10:00:00 1500 mg via INTRAVENOUS
  Filled 2018-06-28: qty 1500

## 2018-06-28 MED ORDER — MAGNESIUM SULFATE 50 % IJ SOLN
40.0000 meq | INTRAMUSCULAR | Status: DC
  Filled 2018-06-28: qty 9.85

## 2018-06-28 MED ORDER — AMIODARONE HCL IN DEXTROSE 360-4.14 MG/200ML-% IV SOLN
30.0000 mg/h | INTRAVENOUS | Status: DC
  Administered 2018-06-28: 30 mg/h via INTRAVENOUS
  Filled 2018-06-28 (×5): qty 200

## 2018-06-28 MED ORDER — AMIODARONE HCL IN DEXTROSE 360-4.14 MG/200ML-% IV SOLN
60.0000 mg/h | INTRAVENOUS | Status: DC
  Administered 2018-06-28: 60 mg/h via INTRAVENOUS
  Filled 2018-06-28: qty 200

## 2018-06-28 MED ORDER — POTASSIUM CHLORIDE 2 MEQ/ML IV SOLN
80.0000 meq | INTRAVENOUS | Status: DC
  Filled 2018-06-28: qty 40

## 2018-06-28 MED ORDER — SODIUM CHLORIDE 0.9 % IV SOLN
1.5000 g | INTRAVENOUS | Status: AC
  Administered 2018-06-29: 11:00:00 1.5 g via INTRAVENOUS
  Filled 2018-06-28: qty 1.5

## 2018-06-28 NOTE — Progress Notes (Signed)
ANTICOAGULATION CONSULT NOTE - follow up  Pharmacy Consult for heparin Indication: atrial fibrillation  Allergies  Allergen Reactions  . Pneumovax [Pneumococcal Polysaccharide Vaccine] Swelling    Arm swelling  . Sulfa Antibiotics Other (See Comments)    UNSPECIFIED CHILDHOOD REACTION  . Levaquin [Levofloxacin] Rash    Patient Measurements: Height: 4' 8" (142.2 cm) Weight: 191 lb 9.3 oz (86.9 kg) IBW/kg (Calculated) : 36.3 Heparin Dosing Weight: 75kg BMI = 42.95 kg/m2   Vital Signs: Temp: 98.4 F (36.9 C) (04/20 1337) Temp Source: Oral (04/20 1337) BP: 131/79 (04/20 1337) Pulse Rate: 69 (04/20 1337)  Labs: Recent Labs    06/26/18 0620 06/27/18 0526 06/28/18 0318 06/28/18 1449  HGB 12.1 12.6 12.3  --   HCT 37.9 38.6 37.2  --   PLT 285 318 296  --   HEPARINUNFRC  --   --   --  0.40  CREATININE 0.59 0.70 0.64  --     Estimated Creatinine Clearance: 54.2 mL/min (by C-G formula based on SCr of 0.64 mg/dL).   Medical History: Past Medical History:  Diagnosis Date  . Asthma   . Breast cancer in female [facility])    1994 s/p chemo and radical L mastectomy  . Diabetes ([place])   . DVT (deep venous thrombosis) (H[facility])    in shoulder after shoulder replacement  . Full dentures   . GERD (gastroesophageal reflux disease)   . Gout   . H/O total shoulder replacement   . History of knee replacement, total, bilateral   . 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 now on daily doxycycline.  Marland Kitchen HTN (hypertension)   . Lymphedema of left arm    s/p mastectomy with lymph node removal for breast cancer  . Morbid obesity ([place])   . Neuropathy   . OSA on CPAP   . Ovarian cancer ([place])    2012 s/p chemo and radical hysterectomy  . PAF (paroxysmal atrial fibrillation) (HCC)    on Eliquis  . Pulmonary nodules    seen on pre TAVR CT scan. needs follow up CT in 06/2019  . Severe aortic stenosis   . Urine incontinence     Medications:   Medications Prior to Admission  Medication Sig Dispense Refill Last Dose  . acetaminophen (TYLENOL) 325 MG tablet Take 650 mg by mouth every 6 (six) hours as needed for mild pain or fever.   Past Month at Unknown time  . apixaban (ELIQUIS) 5 MG TABS tablet Take 5 mg by mouth 2 (two) times daily.   Past Week at Unknown time  . atorvastatin (LIPITOR) 40 MG tablet Take 40 mg by mouth daily.   Past Week at Unknown time  . azelastine (ASTELIN) 0.1 % nasal spray Place 1 spray into both nostrils 2 (two) times daily. Use in each nostril as directed   Past Week at Unknown time  . calcium carbonate (OS-CAL - DOSED IN MG OF ELEMENTAL CALCIUM) 1250 (500 Ca) MG tablet Take 1 tablet by mouth 2 (two) times daily with a meal.   Past Week at Unknown time  . doxycycline (VIBRA-TABS) 100 MG tablet Take 100 mg by mouth 2 (two) times daily.   Past Week at Unknown time  . DULoxetine (CYMBALTA) 30 MG capsule Take 30 mg by mouth 2 (two) times daily.   Past Week at Unknown time  . esomeprazole (NEXIUM) 40 MG capsule Take 40 mg by mouth daily at 12 noon.  Past Week at Unknown time  . fluticasone furoate-vilanterol (BREO ELLIPTA) 200-25 MCG/INH AEPB Inhale 1 puff into the lungs daily.   Past Week at Unknown time  . furosemide (LASIX) 20 MG tablet Take 30 mg by mouth daily.   Past Week at Unknown time  . isosorbide mononitrate (IMDUR) 30 MG 24 hr tablet Take 30 mg by mouth daily.   Past Week at Unknown time  . magnesium 30 MG tablet Take 30 mg by mouth 2 (two) times daily.   Past Week at Unknown time  . metFORMIN (GLUCOPHAGE) 500 MG tablet Take 1,000 mg by mouth 2 (two) times daily with a meal.   Past Week at Unknown time  . metoprolol succinate (TOPROL-XL) 25 MG 24 hr tablet Take 25 mg by mouth daily.   Past Week at Unknown time  . montelukast (SINGULAIR) 10 MG tablet Take 10 mg by mouth at bedtime.   Past Week at Unknown time  . Multiple Vitamin (MULTIVITAMIN WITH MINERALS) TABS tablet Take 1 tablet by mouth daily.   Past  Week at Unknown time  . oxybutynin (DITROPAN-XL) 10 MG 24 hr tablet Take 10 mg by mouth at bedtime.   Past Week at Unknown time  . predniSONE (DELTASONE) 10 MG tablet Take 10-40 mg by mouth See admin instructions. Take as directed by mouth: 4 tablets for 4 days 3 tablets for 4 days 2 tablets for 4 days 1 tablets for 4 days   Past Week at Unknown time  . sitaGLIPtin (JANUVIA) 25 MG tablet Take 25 mg by mouth daily.   Past Week at Unknown time  . vitamin B-12 (CYANOCOBALAMIN) 100 MCG tablet Take 100 mcg by mouth daily.   Past Week at Unknown time  . vitamin E 100 UNIT capsule Take 100 Units by mouth daily.   Past Week at Unknown time   Scheduled:  . aspirin  81 mg Per Tube Daily  . chlorhexidine gluconate (MEDLINE KIT)  15 mL Mouth Rinse BID  . doxycycline  100 mg Oral BID  . furosemide  40 mg Oral Daily  . insulin aspart  0-15 Units Subcutaneous TID WC  . insulin aspart  0-5 Units Subcutaneous QHS  . insulin detemir  14 Units Subcutaneous BID  . insulin starter kit- pen needles  1 kit Other Once  . living well with diabetes book   Does not apply Once  . [START ON 06/29/2018] magnesium sulfate  40 mEq Other To OR  . polyethylene glycol  17 g Oral Daily  . [START ON 06/29/2018] potassium chloride  80 mEq Other To OR  . potassium chloride  40 mEq Oral Daily  . sodium chloride flush  3 mL Intravenous Q12H   Infusions:  . sodium chloride 250 mL (06/26/18 2202)  . amiodarone    . amiodarone    . [START ON 06/29/2018] cefUROXime (ZINACEF)  IV    . [START ON 06/29/2018] dexmedetomidine    . [START ON 06/29/2018] heparin 30,000 units/NS 1000 mL solution for CELLSAVER    . heparin 1,150 Units/hr (06/28/18 0618)  . [START ON 06/29/2018] norepinephrine (LEVOPHED) Adult infusion    . [START ON 06/29/2018] vancomycin      Assessment: 73yo female admitted 4/14 for acute respiratory failure and septic shock, was intubated w/ presumed cardiac component in the context of severe aortic stenosis >>  proceeded to cardiac cath to assess valve disease >> tentatively scheduled for TAVR this week, transferred 4/17 to progressive unit.  Overnight had bursts of  SVT and Vtac and eventually went into Afib w/ RVR 4/19-4/20/20, thus start heparin drip and IV amiodarone for AFib.  Mali VASc score 6.  Morbidly obese with BMI = 42.9 Converted to SR.  Initial 8 hour HL is  0.40 , therapeutic on heparin infusion at 1150 units/hr. H/H wnl, pltc wnl stable. No bleeding noted.  Scheduled for TAVR surgery 06/29/18  Patient was on Eliqus PTA for PAF,  Cardiologist noted anticipates to resume Eliquis along with ASA 43m daily after TAVR if rate and rhythm are stable.   Goal of Therapy:  Heparin level 0.3-0.7 units/ml Monitor platelets by anticoagulation protocol: Yes   Plan:  Continue IV heparin drip at 1150 units/hr and monitor Check 6 hour heparin level to confirm remains therapeutic. Plan is for surgery tomorrow AM (TAVR).   Thank you for allowing pharmacy to be part of this patients care team.  RNicole Cella RIngleside on the BayPharmacist Pager: [phone] Please check AMION for all [place]phone numbers After 10:00 PM, call [phone]/20/2020,3:44 PM

## 2018-06-28 NOTE — Progress Notes (Signed)
  Amiodarone Drug - Drug Interaction Consult Note  Recommendations: Currently only an amio bolus has been ordered; if to continue monitor K+. Amiodarone is metabolized by the cytochrome P450 system and therefore has the potential to cause many drug interactions. Amiodarone has an average plasma half-life of 50 days (range 20 to 100 days).   There is potential for drug interactions to occur several weeks or months after stopping treatment and the onset of drug interactions may be slow after initiating amiodarone.   []  Statins: Increased risk of myopathy. Simvastatin- restrict dose to 20mg  daily. Other statins: counsel patients to report any muscle pain or weakness immediately.  []  Anticoagulants: Amiodarone can increase anticoagulant effect. Consider warfarin dose reduction. Patients should be monitored closely and the dose of anticoagulant altered accordingly, remembering that amiodarone levels take several weeks to stabilize.  []  Antiepileptics: Amiodarone can increase plasma concentration of phenytoin, the dose should be reduced. Note that small changes in phenytoin dose can result in large changes in levels. Monitor patient and counsel on signs of toxicity.  []  Beta blockers: increased risk of bradycardia, AV block and myocardial depression. Sotalol - avoid concomitant use.  []   Calcium channel blockers (diltiazem and verapamil): increased risk of bradycardia, AV block and myocardial depression.  []   Cyclosporine: Amiodarone increases levels of cyclosporine. Reduced dose of cyclosporine is recommended.  []  Digoxin dose should be halved when amiodarone is started.  [x]  Diuretics: increased risk of cardiotoxicity if hypokalemia occurs.  []  Oral hypoglycemic agents (glyburide, glipizide, glimepiride): increased risk of hypoglycemia. Patient's glucose levels should be monitored closely when initiating amiodarone therapy.   []  Drugs that prolong the QT interval:  Torsades de pointes risk may  be increased with concurrent use - avoid if possible.  Monitor QTc, also keep magnesium/potassium WNL if concurrent therapy can't be avoided. Marland Kitchen Antibiotics: e.g. fluoroquinolones, erythromycin. . Antiarrhythmics: e.g. quinidine, procainamide, disopyramide, sotalol. . Antipsychotics: e.g. phenothiazines, haloperidol.  . Lithium, tricyclic antidepressants, and methadone.  Thank you,  Wynona Neat, PharmD, BCPS  06/28/2018 5:11 AM

## 2018-06-28 NOTE — Progress Notes (Signed)
Still waiting on  call triad to call back. Cardiology paged per charge nurse at [phone].

## 2018-06-28 NOTE — Care Management Important Message (Signed)
Important Message  Patient Details  Name: Tanya [name] MRN: [id number] Date of Birth: April 29, 1945   Medicare Important Message Given:  Yes    Orbie Pyo 06/28/2018, 3:16 PM

## 2018-06-28 NOTE — Progress Notes (Signed)
Amiodorone Bolus completed, pt converted to SR, HR 60 right now. BP 104/49. Pt ao x4. Pt stated her chest pressure, and headace is gone. Breathing even and unlabored in 3l o2 via [place]. Will continue to monitor.

## 2018-06-28 NOTE — H&P (View-Only) (Signed)
[place] VALVE TEAM  Patient Name: Tanya [name] Date of Encounter: 06/28/2018  Primary Cardiologist: Dr. Delila Pereyra Problem List     Principal Problem:   Acute on chronic combined systolic and diastolic CHF (congestive heart failure) ([place]) Active Problems:   Acute hypercapnic respiratory failure (HCC)   DKA (diabetic ketoacidoses) (HCC)   Elevated brain natriuretic peptide (BNP) level   Neutrophilic leukocytosis   Urine incontinence   Severe aortic stenosis   PAF (paroxysmal atrial fibrillation) (HCC)   OSA on CPAP   Neuropathy   HTN (hypertension)   Diabetes (HCC)   Asthma   Lymphedema of left arm   Morbid obesity ([place])   Acute on chronic respiratory failure with hypoxia and hypercapnia (HCC)   Demand ischemia (HCC)   Moderate mitral stenosis   Moderate pulmonary arterial systolic hypertension (HCC)     Subjective   Feeling well. Had chest pain and shortness of breath with palpations. Feeling better  Inpatient Medications    Scheduled Meds:  aspirin  81 mg Per Tube Daily   chlorhexidine gluconate (MEDLINE KIT)  15 mL Mouth Rinse BID   furosemide  40 mg Oral Daily   insulin aspart  0-15 Units Subcutaneous TID WC   insulin aspart  0-5 Units Subcutaneous QHS   insulin detemir  14 Units Subcutaneous BID   insulin starter kit- pen needles  1 kit Other Once   living well with diabetes book   Does not apply Once   [START ON 06/29/2018] metoprolol tartrate  50 mg Oral UD   polyethylene glycol  17 g Oral Daily   potassium chloride  40 mEq Oral Daily   sodium chloride flush  3 mL Intravenous Q12H   Continuous Infusions:  sodium chloride 250 mL (06/26/18 2202)   amiodarone 60 mg/hr (06/28/18 0545)   Followed by   amiodarone     heparin 1,150 Units/hr (06/28/18 0618)   magnesium sulfate 1 - 4 g bolus IVPB 2 g (06/28/18 0827)   PRN Meds: sodium chloride, acetaminophen, albuterol, dextrose,  hydrALAZINE, nitroGLYCERIN, ondansetron (ZOFRAN) IV, sodium chloride flush   Vital Signs    Vitals:   06/28/18 0730 06/28/18 0800 06/28/18 0807 06/28/18 0828  BP: 100/68     Pulse: 65   61  Resp: 16 (!) 24 (!) 21 13  Temp: 98.2 F (36.8 C)     TempSrc: Oral     SpO2: 97% 99%    Weight:   86.9 kg   Height:   _0  (1.422 m)     Intake/Output Summary (Last 24 hours) at 06/28/2018 0922 Last data filed at 06/28/2018 0821 Gross per 24 hour  Intake 664.87 ml  Output 900 ml  Net -235.13 ml   Filed Weights   06/27/18 0402 06/27/18 0423 06/28/18 0807  Weight: 86.3 kg 86.9 kg 86.9 kg    Physical Exam   GEN: Well nourished, well developed, in no acute distress. Morbidly obese HEENT: Grossly normal.  Neck: Supple, no JVD, or masses. Cardiac: RRR, 3/6 harsh SEM @ RUSB. No rubs, or gallops. No clubbing, cyanosis, edema.  Radials/DP/PT 2+ and equal bilaterally.  Respiratory:  Respirations regular and unlabored, clear to auscultation bilaterally. GI: Soft, nontender, nondistended, BS + x 4. MS: no deformity or atrophy. Skin: warm and dry, no rash. Neuro:  Strength and sensation are intact. Psych: AAOx3.  Normal affect.  Labs    CBC Recent Labs    06/27/18 0526 06/28/18  0318  WBC 8.7 7.0  HGB 12.6 12.3  HCT 38.6 37.2  MCV 96.3 94.4  PLT 318 629   Basic Metabolic Panel Recent Labs    06/27/18 0526 06/28/18 0318  NA 139 136  K 4.4 4.1  CL 98 98  CO2 29 31  GLUCOSE 171* 167*  BUN 11 13  CREATININE 0.70 0.64  CALCIUM 9.3 9.4  MG 1.6* 1.6*   Liver Function Tests No results for input(s): AST, ALT, ALKPHOS, BILITOT, PROT, ALBUMIN in the last 72 hours. No results for input(s): LIPASE, AMYLASE in the last 72 hours. Cardiac Enzymes No results for input(s): CKTOTAL, CKMB, CKMBINDEX, TROPONINI in the last 72 hours. BNP Invalid input(s): POCBNP D-Dimer No results for input(s): DDIMER in the last 72 hours. Hemoglobin A1C No results for input(s): HGBA1C in the last 72  hours. Fasting Lipid Panel Recent Labs    06/25/18 1223  CHOL 176  HDL 55  LDLCALC 84  TRIG 187*  CHOLHDL 3.2   Thyroid Function Tests No results for input(s): TSH, T4TOTAL, T3FREE, THYROIDAB in the last 72 hours.  Invalid input(s): FREET3  Telemetry    Sinus brady with runs of afib with RVR vs SVT  - Personally Reviewed  ECG    afib with RVR, LBBB HR 166 - Personally Reviewed  Radiology    Ct Coronary Morph W/cta Cor W/score W/ca W/cm &/or Wo/cm  Addendum Date: 06/28/2018   ADDENDUM REPORT: 06/28/2018 08:04 CLINICAL DATA:  73 year old female with severe aortic stenosis being evaluated for a TAVR procedure. EXAM: Cardiac TAVR CT TECHNIQUE: The patient was scanned on a [employer]. A 120 kV retrospective scan was triggered in the descending thoracic aorta at 111 HU's. Gantry rotation speed was 250 msecs and collimation was .6 mm. No beta blockade or nitro were given. The 3D data set was reconstructed in 5% intervals of the R-R cycle. Systolic and diastolic phases were analyzed on a dedicated work station using MPR, MIP and VRT modes. The patient received 80 cc of contrast. FINDINGS: Aortic Valve: Trileaflet aortic valve with only moderately thickened and mildly calcified leaflets and minimal calcifications extending into the LVOT. Leaflet opening is severely restricted. Aorta: Normal size, mild diffuse atherosclerotic plaque and calcifications and no dissection. Sinotubular Junction: 26 x 25 mm Ascending Thoracic Aorta: 31 x 31 mm Aortic Arch: 27 x 25 mm Descending Thoracic Aorta: 25 x 23 mm Sinus of Valsalva Measurements: Non-coronary: 31 mm Right -coronary: 28 mm Left -coronary: 33 mm Coronary Artery Height above Annulus: Left Main: 8.2 mm Right Coronary: 11 mm Virtual Basal Annulus Measurements: Maximum/Minimum Diameter: 24.2 x 18.0 mm Mean Diameter: 20.6 mm Perimeter: 66.2 mm Area: 332 mm2 Optimum Fluoroscopic Angle for Delivery: LAO 1 CAU 0 IMPRESSION: 1. Trileaflet aortic  valve with only moderately thickened and mildly calcified leaflets and minimal calcifications extending into the LVOT. Leaflet opening is severely restricted. Annular measurements suitable for delivery of a 23 mm Edwards-SAPIEN 3 valve (lower end for measurements). 2. Sufficient coronary to annulus distance. 3. Optimum Fluoroscopic Angle for Delivery:  LAO 1 CAU 0 4. No thrombus in a large left atrial appendage. 5. Severe mitral annular calcifications. 6. Severely dilated pulmonary artery measuring 40 mm consistent with pulmonary hypertension. Electronically Signed   By: Ena Dawley   On: 06/28/2018 08:04   Result Date: 06/28/2018 EXAM: OVER-READ INTERPRETATION  CT CHEST The following report is an over-read performed by radiologist Dr. Vinnie Langton of [facility], [place] on 06/28/2018. This over-read does  not include interpretation of cardiac or coronary anatomy or pathology. The coronary calcium score/coronary CTA interpretation by the cardiologist is attached. COMPARISON:  None. FINDINGS: Extracardiac findings will be described separately under dictation for contemporaneously obtained CTA chest, abdomen and pelvis. IMPRESSION: Please see separate dictation for contemporaneously obtained CTA chest, abdomen and pelvis 06/26/2018 for full description of relevant extracardiac findings. Electronically Signed: By: Vinnie Langton M.D. On: 06/28/2018 07:40   Ct Angio Chest Aorta W/cm &/or Wo/cm  Result Date: 06/28/2018 CLINICAL DATA:  73 year old female with history of severe aortic stenosis. Preprocedural study prior to potential transcatheter aortic valve replacement (TAVR) procedure. EXAM: CT ANGIOGRAPHY CHEST, ABDOMEN AND PELVIS TECHNIQUE: Multidetector CT imaging through the chest, abdomen and pelvis was performed using the standard protocol during bolus administration of intravenous contrast. Multiplanar reconstructed images and MIPs were obtained and reviewed to evaluate the vascular anatomy.  CONTRAST:  154m OMNIPAQUE IOHEXOL 350 MG/ML SOLN COMPARISON:  No priors. FINDINGS: CTA CHEST FINDINGS Cardiovascular: Heart size is borderline enlarged. There is no significant pericardial fluid, thickening or pericardial calcification. There is aortic atherosclerosis, as well as atherosclerosis of the great vessels of the mediastinum and the coronary arteries, including calcified atherosclerotic plaque in the left main and left anterior descending coronary arteries. Thickening and calcification of the aortic valve. There is also severe thickening of the mitral valve and mitral annulus. Dilatation of the pulmonic trunk (3.6 cm in diameter). Mediastinum/Lymph Nodes: No pathologically enlarged mediastinal or hilar lymph nodes. Esophagus is unremarkable in appearance. No axillary lymphadenopathy. Lungs/Pleura: Several tiny calcified granulomas are scattered throughout both lungs. Other tiny 2-3 mm pulmonary nodules are also noted in the periphery of the lungs, nonspecific, but statistically likely benign areas of mucoid impaction. No larger more suspicious appearing pulmonary nodules or masses are noted. No acute consolidative airspace disease. Small left and small to moderate right pleural effusions lying dependently with some associated passive subsegmental atelectasis in the dependent portions of the lower lobes of the lungs bilaterally. Musculoskeletal/Soft Tissues: Status post left shoulder arthroplasty. There are no aggressive appearing lytic or blastic lesions noted in the visualized portions of the skeleton. Status post left modified radical mastectomy. CTA ABDOMEN AND PELVIS FINDINGS Hepatobiliary: No suspicious cystic or solid hepatic lesions. No intra or extrahepatic biliary ductal dilatation. Gallbladder is normal in appearance. Pancreas: No pancreatic mass. No pancreatic ductal dilatation. No pancreatic or peripancreatic fluid or inflammatory changes. Spleen: Unremarkable. Adrenals/Urinary Tract:  Subcentimeter low-attenuation lesions in the kidneys bilaterally, too small to characterize, but statistically likely to represent tiny cysts. Bilateral adrenal glands are normal in appearance. No hydroureteronephrosis. Foley balloon catheter in the lumen of the urinary bladder. Small amount of gas non dependently in the lumen of the urinary bladder is iatrogenic. Urinary bladder is otherwise normal in appearance. Stomach/Bowel: Unenhanced appearance of the stomach is normal. No pathologic dilatation of small bowel or colon. Numerous colonic diverticulae are noted, particularly in the sigmoid colon, without surrounding inflammatory changes to suggest an acute diverticulitis at this time. Normal appendix. Vascular/Lymphatic: Vascular findings and measurements pertinent to potential TAVR procedure, as detailed below. Aortic atherosclerosis, without evidence of aneurysm or dissection in the abdominal or pelvic vasculature. No lymphadenopathy noted in the abdomen or pelvis. Reproductive: Status post hysterectomy. Ovaries are not confidently identified may be surgically absent or atrophic. Other: Small supraumbilical ventral hernia containing a short segment of mid small bowel, without evidence of bowel incarceration or obstruction at this time. No significant volume of ascites. No pneumoperitoneum. Musculoskeletal: There are no  aggressive appearing lytic or blastic lesions noted in the visualized portions of the skeleton. VASCULAR MEASUREMENTS PERTINENT TO TAVR: AORTA: Minimal Aortic Diameter-12 x 11 mm Severity of Aortic Calcification-mild RIGHT PELVIS: Right Common Iliac Artery - Minimal Diameter-8.3 x 7.8 mm Tortuosity-mild Calcification-mild Right External Iliac Artery - Minimal Diameter-5.9 x 5.2 mm Tortuosity-moderate Calcification-none Right Common Femoral Artery - Minimal Diameter-6.1 x 5.6 mm Tortuosity-mild Calcification-minimal LEFT PELVIS: Left Common Iliac Artery - Minimal Diameter-7.1 x 7.0 mm  Tortuosity-mild Calcification-mild Left External Iliac Artery - Minimal Diameter-5.7 x 5.2 mm Tortuosity-moderate Calcification-none Left Common Femoral Artery - Minimal Diameter-5.3 x 5.3 mm Tortuosity-mild Calcification-none Review of the MIP images confirms the above findings. IMPRESSION: 1. Vascular findings and measurements pertinent to potential TAVR procedure, as detailed above. 2. Severe thickening calcification of the aortic valve, compatible with the reported clinical history of severe aortic stenosis. There is also severe calcifications of the mitral valve and mitral annulus. 3. Dilatation of the pulmonic trunk (3.6 cm in diameter), concerning for pulmonary arterial hypertension. 4. Small left and small to moderate right pleural effusions lying dependently with some associated passive subsegmental atelectasis in the lower lobes of the lungs bilaterally. 5. Multiple tiny 2-3 mm pulmonary nodules scattered throughout the periphery of the lungs bilaterally, nonspecific, but strongly favored to represent benign areas of mucoid impaction within terminal bronchioles. No follow-up needed if patient is low-risk (and has no known or suspected primary neoplasm). Non-contrast chest CT can be considered in 12 months if patient is high-risk. This recommendation follows the consensus statement: Guidelines for Management of Incidental Pulmonary Nodules Detected on CT Images: From the Fleischner Society 2017; Radiology 2017; 284:228-243. 6. Colonic diverticulosis without evidence of acute diverticulitis at this time. 7. Small epigastric ventral hernia, containing a short segment of mid small bowel, without evidence of bowel incarceration or obstruction at this time. 8. Additional incidental findings, as above. Electronically Signed   By: Vinnie Langton M.D.   On: 06/28/2018 08:18   Ct Angio Abd/pel W/ And/or W/o  Result Date: 06/28/2018 CLINICAL DATA:  73 year old female with history of severe aortic stenosis.  Preprocedural study prior to potential transcatheter aortic valve replacement (TAVR) procedure. EXAM: CT ANGIOGRAPHY CHEST, ABDOMEN AND PELVIS TECHNIQUE: Multidetector CT imaging through the chest, abdomen and pelvis was performed using the standard protocol during bolus administration of intravenous contrast. Multiplanar reconstructed images and MIPs were obtained and reviewed to evaluate the vascular anatomy. CONTRAST:  16m OMNIPAQUE IOHEXOL 350 MG/ML SOLN COMPARISON:  No priors. FINDINGS: CTA CHEST FINDINGS Cardiovascular: Heart size is borderline enlarged. There is no significant pericardial fluid, thickening or pericardial calcification. There is aortic atherosclerosis, as well as atherosclerosis of the great vessels of the mediastinum and the coronary arteries, including calcified atherosclerotic plaque in the left main and left anterior descending coronary arteries. Thickening and calcification of the aortic valve. There is also severe thickening of the mitral valve and mitral annulus. Dilatation of the pulmonic trunk (3.6 cm in diameter). Mediastinum/Lymph Nodes: No pathologically enlarged mediastinal or hilar lymph nodes. Esophagus is unremarkable in appearance. No axillary lymphadenopathy. Lungs/Pleura: Several tiny calcified granulomas are scattered throughout both lungs. Other tiny 2-3 mm pulmonary nodules are also noted in the periphery of the lungs, nonspecific, but statistically likely benign areas of mucoid impaction. No larger more suspicious appearing pulmonary nodules or masses are noted. No acute consolidative airspace disease. Small left and small to moderate right pleural effusions lying dependently with some associated passive subsegmental atelectasis in the dependent portions of the lower  lobes of the lungs bilaterally. Musculoskeletal/Soft Tissues: Status post left shoulder arthroplasty. There are no aggressive appearing lytic or blastic lesions noted in the visualized portions of the  skeleton. Status post left modified radical mastectomy. CTA ABDOMEN AND PELVIS FINDINGS Hepatobiliary: No suspicious cystic or solid hepatic lesions. No intra or extrahepatic biliary ductal dilatation. Gallbladder is normal in appearance. Pancreas: No pancreatic mass. No pancreatic ductal dilatation. No pancreatic or peripancreatic fluid or inflammatory changes. Spleen: Unremarkable. Adrenals/Urinary Tract: Subcentimeter low-attenuation lesions in the kidneys bilaterally, too small to characterize, but statistically likely to represent tiny cysts. Bilateral adrenal glands are normal in appearance. No hydroureteronephrosis. Foley balloon catheter in the lumen of the urinary bladder. Small amount of gas non dependently in the lumen of the urinary bladder is iatrogenic. Urinary bladder is otherwise normal in appearance. Stomach/Bowel: Unenhanced appearance of the stomach is normal. No pathologic dilatation of small bowel or colon. Numerous colonic diverticulae are noted, particularly in the sigmoid colon, without surrounding inflammatory changes to suggest an acute diverticulitis at this time. Normal appendix. Vascular/Lymphatic: Vascular findings and measurements pertinent to potential TAVR procedure, as detailed below. Aortic atherosclerosis, without evidence of aneurysm or dissection in the abdominal or pelvic vasculature. No lymphadenopathy noted in the abdomen or pelvis. Reproductive: Status post hysterectomy. Ovaries are not confidently identified may be surgically absent or atrophic. Other: Small supraumbilical ventral hernia containing a short segment of mid small bowel, without evidence of bowel incarceration or obstruction at this time. No significant volume of ascites. No pneumoperitoneum. Musculoskeletal: There are no aggressive appearing lytic or blastic lesions noted in the visualized portions of the skeleton. VASCULAR MEASUREMENTS PERTINENT TO TAVR: AORTA: Minimal Aortic Diameter-12 x 11 mm Severity of  Aortic Calcification-mild RIGHT PELVIS: Right Common Iliac Artery - Minimal Diameter-8.3 x 7.8 mm Tortuosity-mild Calcification-mild Right External Iliac Artery - Minimal Diameter-5.9 x 5.2 mm Tortuosity-moderate Calcification-none Right Common Femoral Artery - Minimal Diameter-6.1 x 5.6 mm Tortuosity-mild Calcification-minimal LEFT PELVIS: Left Common Iliac Artery - Minimal Diameter-7.1 x 7.0 mm Tortuosity-mild Calcification-mild Left External Iliac Artery - Minimal Diameter-5.7 x 5.2 mm Tortuosity-moderate Calcification-none Left Common Femoral Artery - Minimal Diameter-5.3 x 5.3 mm Tortuosity-mild Calcification-none Review of the MIP images confirms the above findings. IMPRESSION: 1. Vascular findings and measurements pertinent to potential TAVR procedure, as detailed above. 2. Severe thickening calcification of the aortic valve, compatible with the reported clinical history of severe aortic stenosis. There is also severe calcifications of the mitral valve and mitral annulus. 3. Dilatation of the pulmonic trunk (3.6 cm in diameter), concerning for pulmonary arterial hypertension. 4. Small left and small to moderate right pleural effusions lying dependently with some associated passive subsegmental atelectasis in the lower lobes of the lungs bilaterally. 5. Multiple tiny 2-3 mm pulmonary nodules scattered throughout the periphery of the lungs bilaterally, nonspecific, but strongly favored to represent benign areas of mucoid impaction within terminal bronchioles. No follow-up needed if patient is low-risk (and has no known or suspected primary neoplasm). Non-contrast chest CT can be considered in 12 months if patient is high-risk. This recommendation follows the consensus statement: Guidelines for Management of Incidental Pulmonary Nodules Detected on CT Images: From the Fleischner Society 2017; Radiology 2017; 284:228-243. 6. Colonic diverticulosis without evidence of acute diverticulitis at this time. 7. Small  epigastric ventral hernia, containing a short segment of mid small bowel, without evidence of bowel incarceration or obstruction at this time. 8. Additional incidental findings, as above. Electronically Signed   By: Vinnie Langton M.D.   On:  06/28/2018 08:18    Cardiac Studies   Cardiac catheterization 06/25/2018:  Left dominant coronary anatomy with normal coronary arteries.  Severe aortic stenosis.  Moderate mitral stenosis.  Normal pulmonary artery pressures  Normal filling pressures.LVEDP 13 mmHg and pulmonary capillary wedge mean 15 mmHg.  Severe mitral annular calcification and to a lesser extent aortic valve calcification on cine fluoroscopy.  RECOMMENDATIONS:   Discussed with Dr. Sherren Mocha.  Consideration is being given to TAVR.  _____________   Echocardiogram 06/23/2018: 1. The left ventricle has mild-moderately reduced systolic function, with an ejection fraction of 40-45%. The cavity size was normal. Left ventricular diastolic function could not be evaluated. Left ventricular diffuse hypokinesis. 2. The right ventricle has normal systolic function. The cavity was normal. There is no increase in right ventricular wall thickness. Right ventricular systolic pressure is moderately elevated with an estimated pressure of 64.6 mmHg. 3. Left atrial size was moderately dilated. 4. Right atrial size was moderately dilated. 5. The mitral valve is degenerative. Mild thickening of the mitral valve leaflet. There is moderate mitral annular calcification present. Mitral valve regurgitation is moderate by color flow Doppler. Moderate mitral valve stenosis. 6. The aortic valve is tricuspid. Severely thickening of the aortic valve. Severe calcifcation of the aortic valve. Aortic valve regurgitation is moderate by color flow Doppler. Severe stenosis of the aortic valve. 7. Pulmonary hypertension is moderate.  _____________   Cardiac CT 06/26/18 IMPRESSION: 1.  Trileaflet aortic valve with only moderately thickened and mildly calcified leaflets and minimal calcifications extending into the LVOT. Leaflet opening is severely restricted. Annular measurements suitable for delivery of a 23 mm Edwards-SAPIEN 3 valve (lower end for measurements).  2. Sufficient coronary to annulus distance.  3. Optimum Fluoroscopic Angle for Delivery:  LAO 1 CAU 0  4. No thrombus in a large left atrial appendage.  5. Severe mitral annular calcifications.  6. Severely dilated pulmonary artery measuring 40 mm consistent with pulmonary hypertension.  _____________  CTA chest/abd/pelvis 06/26/18 IMPRESSION: 1. Vascular findings and measurements pertinent to potential TAVR procedure, as detailed above. 2. Severe thickening calcification of the aortic valve, compatible with the reported clinical history of severe aortic stenosis. There is also severe calcifications of the mitral valve and mitral annulus. 3. Dilatation of the pulmonic trunk (3.6 cm in diameter), concerning for pulmonary arterial hypertension. 4. Small left and small to moderate right pleural effusions lying dependently with some associated passive subsegmental atelectasis in the lower lobes of the lungs bilaterally. 5. Multiple tiny 2-3 mm pulmonary nodules scattered throughout the periphery of the lungs bilaterally, nonspecific, but strongly favored to represent benign areas of mucoid impaction within terminal bronchioles. No follow-up needed if patient is low-risk (and has no known or suspected primary neoplasm). Non-contrast chest CT can be considered in 12 months if patient is high-risk. This recommendation follows the consensus statement: Guidelines for Management of Incidental Pulmonary Nodules Detected on CT Images: From the Fleischner Society 2017; Radiology 2017; 284:228-243. 6. Colonic diverticulosis without evidence of acute diverticulitis at this time. 7. Small epigastric  ventral hernia, containing a short segment of mid small bowel, without evidence of bowel incarceration or obstruction at this time. 8. Additional incidental findings, as above.    Patient Profile     Chimamanda Siegfried Currington is a 73 y.o. female with a hx of HTN, DMT2, HLD, morbid obesity, PAF on eliquis, breast cancer s/p chemo and left radical mastectomy (1990s), ovarian cancer s/p chemo s/p radical hysterectomy (2010), left arm lymphedema, COPD on 2L  02, LBBB, OSA on CPAP, history of septic arthritis on chronic doxycycline and aortic stenosis who presented to [facility] on [facility]/22/18 with acute on chronichypoxic and hypercapnicrespiratory failure in the setting of acute CHF 2/2 severe aortic stenosis and moderate mitral stenosis/regurgitation.  Assessment & Plan    Severe AS: plan is for tentative TAVR tomorrow. She sized to a 23 mm Edwards sapien with adequate access for TF approach. PT assessment today. I will place inpatient TAVR orders now.   PAF: had runs of afib with RVR from overnight. Required oral metoprolol with the addition of IV amio bolus with drip. Will convert this to oral amio now. HR in sinus brady. On IV heparin. Mg+ supplemented as well. Plan to resume home eliquis and Toprol XL after TAVR if rate and rhythm are stable.   Acute on chronic CHF: net neg 5.5L. Weight down 8 lbs (199--> 191lbs). Now on oral lasix 71m daily. Creat remains normal.   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 Plan to treat medically.   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   HTN: BP on soft side.  Pulmonary nodules: noted on pre TAVR CT scan. With 30 year pack history of smoking, she will need follow up CT scan in 12 months. I will follow this as an outpatient.    Signed, KAngelena Form PA-C  06/28/2018, 9:22 AM  Pager [phone] Patient seen, examined. Available data reviewed. Agree with findings, assessment, and plan as outlined by KNell Range  PA-C.  The patient is independently interviewed and examined.  She is alert and oriented, in no distress.  She is sitting up at the bedside.  She had a rough night with atrial fibrillation with RVR and associated chest discomfort and shortness of breath.  Fortunately, she converted back to sinus rhythm on IV amiodarone at about 5:30 AM today.  She has felt better since that time.  She denies chest pain or shortness of breath at rest at the time of my evaluation.  She is alert, oriented, in no distress.  Lung fields are clear.  Heart is regular rate and rhythm with a grade 3/6 harsh late peaking systolic murmur at the right upper sternal border.  JVP is normal.  Abdomen is soft and nontender.  Extremities have no edema.  Telemetry is reviewed and shows atrial fibrillation with RVR overnight, with conversion to sinus rhythm early this morning, now in sinus rhythm with a heart rate of 60 to 65 bpm.  I have personally reviewed the patient's CTA studies, echo images, and cardiac catheterization images.  I have discussed her case with Dr. ORoxy Manns  We both agree that she is a candidate for TAVR via a transfemoral approach.  She appears stable for surgery tomorrow.  I have reviewed the risks, indications, and alternatives to TAVR with the patient at length.  I have also discussed the same risks with her daughter over the telephone.  Following TAVR, I would anticipate placing her back on a direct oral anticoagulant drug along with aspirin 81 mg daily.  I would continue IV amiodarone perioperatively to prevent recurrent atrial fibrillation with RVR.  All of her questions are answered today.  She will be made n.p.o. after midnight for surgery tomorrow.  I agree with the documentation above related to the patient's other issues.  MSherren Mocha M.D. 06/28/2018 2:51 PM

## 2018-06-28 NOTE — Progress Notes (Signed)
On call triad NP x [place] called back, notified her about the event. Stated go ahead with cardiology orders.

## 2018-06-28 NOTE — Progress Notes (Addendum)
0404: On call cardiologist DR. Delfina Redwood called back . Notified MD of previous svts and current situation. Told MD 12 lead EKG showed afib/RVR and HR is between 150's to 209. MD asked if pt is on any medication for afib, pt just had 50 mg metoprolol scheduled for 1 hr before TVAR nothing PRN or scheduled. MD stated give 50 mg metoprolol po now x1 dose. Asked her if pt can have anything IV as HR is sustaining for more than 30 min and rate is anywhere from 150's to 209. MD stated Iv being small dose is not going to help her much and give PO for now.  2595: 50 mg po metoprolol administered. Pt resting in bed, AO x4. sated she could feel her heart beating fast, and chest pressure 4/10. Breathing even and unlabored in 3l 02 via Rudy. bp 148/117, o2 sat 97%. Call bell within reach. I have been staying with pt since last hour. Will continue to monitor.

## 2018-06-28 NOTE — Progress Notes (Signed)
PROGRESS NOTE  Tanya [name]:[id number] DOB: 12-20-1945 DOA: 06/22/2018 PCP: System, Pcp Not In   LOS: 6 days   Brief Narrative / Interim history: 73 yo WF PMHx  HTN, poorly controlled NIDDM-2 with complication, OSA on CPAP, PAF on Eliquis, gout, breast cancer status post radical left mastectomy and chemo in 1994, ovarian cancer status post chemo hysterectomy and chemo in 2012, and severe AS presenting to [facility] ED with 10/10 chest pain for several days.  Initial labs reveal WBC 18 without lymphopenia. LDH 241. D-Dimer 0.87. BNP 447. Glucose 429. CXR reveals bilatopacities.  Patient exhibited respiratory distress with tachypnea & tachycardia to 140s, and was intubated in the ED on 4/14. She was also started on insulin gtt for glucose >400, and ceftriaxone and azithromycin due to leukocytosis with concerning chest x-ray with bilateral opacities. Cardiology consulted in ED given chest pain.  Echo with EF of 40 to 45%, diffuse hypokinesis, RVSP to 64, mod LAE, mod RAE, mod MS, severe AS and mod AR.  EKG with LBBB (unknown baseline).  Cardiology has been following and planning for TAVR.  Patient tested for COVID-19 and was negative.  Patient was extubated on 4/15 and transferred to [facility] on 4/16.  Patient had an episode of SVT and A. fib with RVR the night of 4/19-4/20 while asleep.  She was not symptomatic until she was awakened by RN.  Started on amiodarone drip and heparin.   Subjective: Patient had an episode of SVT and A. fib with RVR overnight while asleep.  She was asymptomatic until she was awakened by RN.  Started on amiodarone drip and heparin.  Now back to sinus rhythm.  Asymptomatic.  Assessment & Plan: Principal Problem:   Acute on chronic combined systolic and diastolic CHF (congestive heart failure) (HCC) Active Problems:   Acute hypercapnic respiratory failure (HCC)   DKA (diabetic ketoacidoses) (HCC)   Elevated brain natriuretic peptide (BNP) level   Neutrophilic  leukocytosis   Urine incontinence   Severe aortic stenosis   PAF (paroxysmal atrial fibrillation) (HCC)   OSA on CPAP   Neuropathy   HTN (hypertension)   Diabetes (HCC)   Asthma   Lymphedema of left arm   Morbid obesity (HCC)   Acute on chronic respiratory failure with hypoxia and hypercapnia (HCC)   Demand ischemia (HCC)   Moderate mitral stenosis   Moderate pulmonary arterial systolic hypertension (HCC)   Pulmonary nodules  Acute respiratory failure: likely due to acute CHF exacerbation and pneumonia-resolved.  RVP and COVID-19 negative.  -Wean oxygen as able-currently in upper 90s on 2L -Manage CHF, AAS and pneumonia as below.  Septic shock: Likely due to pneumonia as above.  Resolved.  Blood cultures negative. -Treat pneumonia as below  Acute systolic CHF exacerbation: Echo findings as above.  Severe aortic stenosis, moderate AR, moderate MS Moderate pulmonary hypertension LBBB on EKG -Fair urine output with net negative on p.o. Lasix.  Renal function stable. -CVP elevated to 30s. -Cardiology managing-plan for TAVR next week -Continue p.o. Lasix 40 mg daily. -Daily weight, intake output and renal function.  Poorly controlled NIDDM-2 with macrovascular complications.  A1c 8.5%. -Continue Levemir 14 units twice daily -Continue sliding scale insulin -CBG monitoring -Continue statin  Paroxysmal atrial fibrillation with RVR: went into RVR the night of 4/19-4/20.  Started on amiodarone drip and heparin.  Apparently her home metoprolol was on hold.  She is also not compliant with CPAP.  Mali vasc score 6. -Defer p.o. option to cardiology. -Continue heparin.  Eliquis on hold. -Closely monitor electrolytes. -Encourage CPAP.  Hypokalemia/hypomagnesemia:  -Replace and recheck. -On KCl 40 daily.  OSA-apparently not compliant with CPAP.  Has been wearing oxygen. -Continue encouraging  Other chronic medical conditions stable.  Scheduled Meds: . aspirin  81 mg Per Tube  Daily  . chlorhexidine gluconate (MEDLINE KIT)  15 mL Mouth Rinse BID  . doxycycline  100 mg Oral BID  . furosemide  40 mg Oral Daily  . insulin aspart  0-15 Units Subcutaneous TID WC  . insulin aspart  0-5 Units Subcutaneous QHS  . insulin detemir  14 Units Subcutaneous BID  . insulin starter kit- pen needles  1 kit Other Once  . living well with diabetes book   Does not apply Once  . [START ON 06/29/2018] magnesium sulfate  40 mEq Other To OR  . polyethylene glycol  17 g Oral Daily  . [START ON 06/29/2018] potassium chloride  80 mEq Other To OR  . potassium chloride  40 mEq Oral Daily  . sodium chloride flush  3 mL Intravenous Q12H   Continuous Infusions: . sodium chloride 250 mL (06/26/18 2202)  . amiodarone 60 mg/hr (06/28/18 0545)   Followed by  . amiodarone    . [START ON 06/29/2018] cefUROXime (ZINACEF)  IV    . [START ON 06/29/2018] dexmedetomidine    . [START ON 06/29/2018] heparin 30,000 units/NS 1000 mL solution for CELLSAVER    . heparin 1,150 Units/hr (06/28/18 0618)  . [START ON 06/29/2018] norepinephrine (LEVOPHED) Adult infusion    . [START ON 06/29/2018] vancomycin     PRN Meds:.sodium chloride, acetaminophen, albuterol, dextrose, hydrALAZINE, nitroGLYCERIN, ondansetron (ZOFRAN) IV, sodium chloride flush  DVT prophylaxis: On subcu Lovenox for VTE prophylaxis Code Status: Full code Family Communication: None at bedside Disposition Plan: Remains inpatient for further cardiac evaluation and possible TAVR  Consultants:   Cardiology  Procedures:   ETT 4/14-4/15  Microbiology:  Blood culture on 4/14-negative  RVP on 4/14-negative  COVID-19 on 4/14-negative  Trach aspirate on 4/14-  Antimicrobials:  Azithromycin 4/14--4/18  Cefepime 4/14--4/15  Ceftriaxone 4/16-4/18  Objective: Vitals:   06/28/18 0730 06/28/18 0800 06/28/18 0807 06/28/18 0828  BP: 100/68     Pulse: 65   61  Resp: 16 (!) 24 (!) 21 13  Temp: 98.2 F (36.8 C)     TempSrc: Oral      SpO2: 97% 99%    Weight:   86.9 kg   Height:   '4\' 8"'  (1.422 m)     Intake/Output Summary (Last 24 hours) at 06/28/2018 1110 Last data filed at 06/28/2018 0821 Gross per 24 hour  Intake 484.87 ml  Output 600 ml  Net -115.13 ml   Filed Weights   06/27/18 0402 06/27/18 0423 06/28/18 0807  Weight: 86.3 kg 86.9 kg 86.9 kg    Examination:  GENERAL: Appears well. No acute distress.  EYES - vision grossly intact. Sclera anicteric.  NOSE- no gross deformity or drainage MOUTH - no oral lesions noted THROAT- no swelling or erythema LUNGS:  No IWOB. Good air movement. CTAB.  HEART: RR with PAC.  3/6 SEM over RUSB ABD: Bowel sounds present. Soft. Non tender.  MSK/EXT: Moves extremities. No obvious deformity. SKIN: no apparent skin lesion.  NEURO: Awake, alert and oriented appropriately.  No gross deficit.  PSYCH: Calm. Normal affect.   GENERAL: Appears well. No acute distress.  Sitting on bedside eating breakfast. HEENT: MMM.  Vision and Hearing grossly intact.  NECK: Supple.  No JVD.  LUNGS:  No IWOB. Good air movement. CTAB.  HEART: Heart rate in upper 50s to 60s.  3/6 SEM over RUSB. ABD: Bowel sounds present. Soft. Non tender.  EXT:   no edema bilaterally.  SKIN: no apparent skin lesion.  NEURO: Awake, alert and oriented appropriately.  No gross deficit.  PSYCH: Calm. Normal affect.  Access: Right IJ  Data Reviewed: I have independently reviewed following labs and imaging studies   CBC: Recent Labs  Lab 06/22/18 1905  06/24/18 0344 06/25/18 0640 06/25/18 0833 06/25/18 0837 06/26/18 0620 06/27/18 0526 06/28/18 0318  WBC 18.0*   < > 9.9 8.7  --   --  7.0 8.7 7.0  NEUTROABS 12.2*  --   --   --   --   --   --   --   --   HGB 13.7   < > 12.5 13.0 13.6  13.9 13.6 12.1 12.6 12.3  HCT 44.4   < > 37.9 40.1 40.0  41.0 40.0 37.9 38.6 37.2  MCV 98.4   < > 95.9 95.7  --   --  94.8 96.3 94.4  PLT 465*   < > 300 301  --   --  285 318 296   < > = values in this interval not  displayed.   Basic Metabolic Panel: Recent Labs  Lab 06/22/18 1905  06/22/18 2338  06/23/18 0438  06/24/18 0344 06/25/18 0640 06/25/18 0833 06/25/18 0837 06/26/18 0620 06/27/18 0526 06/28/18 0318  NA 135   < > 139   < >  --    < > 140 138 140  140 138 140 139 136  K 5.1   < > 4.2   < >  --    < > 3.8 3.3* 3.6  3.6 3.7 3.9 4.4 4.1  CL 101  --  106  --   --    < > 101 97*  --   --  101 98 98  CO2 18*  --  24  --   --    < > 27 30  --   --  '29 29 31  ' GLUCOSE 429*  --  205*  --   --    < > 146* 174*  --   --  172* 171* 167*  BUN 22  --  22  --   --    < > 15 14  --   --  '9 11 13  ' CREATININE 1.00  --  0.76  --   --    < > 0.69 0.77  --   --  0.59 0.70 0.64  CALCIUM 8.8*  --  7.8*  --   --    < > 8.3* 8.7*  --   --  8.8* 9.3 9.4  MG 1.7  --  1.4*  --  1.3*  --  1.4* 1.6*  --   --  1.7 1.6* 1.6*  PHOS 7.6*  --  4.5  --  3.7  --  3.9  --   --   --   --   --   --    < > = values in this interval not displayed.   GFR: Estimated Creatinine Clearance: 54.2 mL/min (by C-G formula based on SCr of 0.64 mg/dL). Liver Function Tests: Recent Labs  Lab 06/22/18 1905  AST 48*  ALT 34  ALKPHOS 69  BILITOT 1.2  PROT 7.4  ALBUMIN 4.2   Recent Labs  Lab 06/22/18 1905  LIPASE 23   No results for input(s): AMMONIA in the last 168 hours. Coagulation Profile: Recent Labs  Lab 06/22/18 1935  INR 1.2   Cardiac Enzymes: Recent Labs  Lab 06/22/18 2338 06/23/18 0438 06/23/18 1425 06/24/18 0344  TROPONINI 0.21* 0.85* 0.54* 0.44*   BNP (last 3 results) No results for input(s): PROBNP in the last 8760 hours. HbA1C: No results for input(s): HGBA1C in the last 72 hours. CBG: Recent Labs  Lab 06/27/18 0630 06/27/18 1117 06/27/18 1614 06/27/18 2139 06/28/18 0614  GLUCAP 189* 172* 149* 201* 206*   Lipid Profile: Recent Labs    06/25/18 1223  CHOL 176  HDL 55  LDLCALC 84  TRIG 187*  CHOLHDL 3.2   Thyroid Function Tests: Recent Labs    06/28/18 0842  TSH 3.436  FREET4  0.76*   Anemia Panel: No results for input(s): VITAMINB12, FOLATE, FERRITIN, TIBC, IRON, RETICCTPCT in the last 72 hours. Urine analysis:    Component Value Date/Time   COLORURINE YELLOW 06/22/2018 2140   APPEARANCEUR HAZY (A) 06/22/2018 2140   LABSPEC 1.021 06/22/2018 2140   PHURINE 5.0 06/22/2018 2140   GLUCOSEU >=500 (A) 06/22/2018 2140   HGBUR NEGATIVE 06/22/2018 2140   [place] NEGATIVE 06/22/2018 2140   [place] 06/22/2018 2140   PROTEINUR 100 (A) 06/22/2018 2140   NITRITE NEGATIVE 06/22/2018 2140   LEUKOCYTESUR NEGATIVE 06/22/2018 2140   Sepsis Labs: Invalid input(s): PROCALCITONIN, LACTICIDVEN  Recent Results (from the past 240 hour(s))  Culture, blood (routine x 2)     Status: None   Collection Time: 06/22/18  7:47 PM  Result Value Ref Range Status   Specimen Description BLOOD RIGHT FOREARM  Final   Special Requests   Final    BOTTLES DRAWN AEROBIC AND ANAEROBIC Blood Culture results may not be optimal due to an inadequate volume of blood received in culture bottles   Culture   Final    NO GROWTH 5 DAYS Performed at [facility], [place] [address] [place], [place] [address]    Report Status 06/27/2018 FINAL  Final  Culture, blood (routine x 2)     Status: None   Collection Time: 06/22/18  7:49 PM  Result Value Ref Range Status   Specimen Description BLOOD RIGHT HAND  Final   Special Requests   Final    BOTTLES DRAWN AEROBIC ONLY Blood Culture results may not be optimal due to an excessive volume of blood received in culture bottles   Culture   Final    NO GROWTH 5 DAYS Performed at [facility], [place] [address] [place], [place] [address]    Report Status 06/27/2018 FINAL  Final  Respiratory Panel by PCR     Status: None   Collection Time: 06/22/18  8:40 PM  Result Value Ref Range Status   Adenovirus NOT DETECTED NOT DETECTED Final   Coronavirus 229E NOT DETECTED NOT DETECTED Final    Comment: (NOTE) The Coronavirus on the Respiratory  Panel, DOES NOT test for the novel  Coronavirus (2019 nCoV)    Coronavirus HKU1 NOT DETECTED NOT DETECTED Final   Coronavirus NL63 NOT DETECTED NOT DETECTED Final   Coronavirus OC43 NOT DETECTED NOT DETECTED Final   Metapneumovirus NOT DETECTED NOT DETECTED Final   Rhinovirus / Enterovirus NOT DETECTED NOT DETECTED Final   Influenza A NOT DETECTED NOT DETECTED Final   Influenza B NOT DETECTED NOT DETECTED Final   Parainfluenza Virus 1 NOT DETECTED NOT DETECTED Final   Parainfluenza Virus  2 NOT DETECTED NOT DETECTED Final   Parainfluenza Virus 3 NOT DETECTED NOT DETECTED Final   Parainfluenza Virus 4 NOT DETECTED NOT DETECTED Final   Respiratory Syncytial Virus NOT DETECTED NOT DETECTED Final   Bordetella pertussis NOT DETECTED NOT DETECTED Final   Chlamydophila pneumoniae NOT DETECTED NOT DETECTED Final   Mycoplasma pneumoniae NOT DETECTED NOT DETECTED Final    Comment: Performed at [facility], [place] [address] [place], [place] [address]  SARS Coronavirus 2 [facility] order, Performed in [facility] hospital lab)     Status: None   Collection Time: 06/22/18  8:40 PM  Result Value Ref Range Status   SARS Coronavirus 2 NEGATIVE NEGATIVE Final    Comment: (NOTE) If result is NEGATIVE SARS-CoV-2 target nucleic acids are NOT DETECTED. The SARS-CoV-2 RNA is generally detectable in upper and lower  respiratory specimens during the acute phase of infection. The lowest  concentration of SARS-CoV-2 viral copies this assay can detect is 250  copies / mL. A negative result does not preclude SARS-CoV-2 infection  and should not be used as the sole basis for treatment or other  patient management decisions.  A negative result may occur with  improper specimen collection / handling, submission of specimen other  than nasopharyngeal swab, presence of viral mutation(s) within the  areas targeted by this assay, and inadequate number of viral copies  (<250 copies / mL). A negative result must  be combined with clinical  observations, patient history, and epidemiological information. If result is POSITIVE SARS-CoV-2 target nucleic acids are DETECTED. The SARS-CoV-2 RNA is generally detectable in upper and lower  respiratory specimens dur ing the acute phase of infection.  Positive  results are indicative of active infection with SARS-CoV-2.  Clinical  correlation with patient history and other diagnostic information is  necessary to determine patient infection status.  Positive results do  not rule out bacterial infection or co-infection with other viruses. If result is PRESUMPTIVE POSTIVE SARS-CoV-2 nucleic acids MAY BE PRESENT.   A presumptive positive result was obtained on the submitted specimen  and confirmed on repeat testing.  While 2019 novel coronavirus  (SARS-CoV-2) nucleic acids may be present in the submitted sample  additional confirmatory testing may be necessary for epidemiological  and / or clinical management purposes  to differentiate between  SARS-CoV-2 and other Sarbecovirus currently known to infect humans.  If clinically indicated additional testing with an alternate test  methodology [phone]) is advised. The SARS-CoV-2 RNA is generally  detectable in upper and lower respiratory sp ecimens during the acute  phase of infection. The expected result is Negative. Fact Sheet for Patients:  StrictlyIdeas.no Fact Sheet for Healthcare Providers: BankingDealers.co.za This test is not yet approved or cleared by the [place] FDA and has been authorized for detection and/or diagnosis of SARS-CoV-2 by FDA under an Emergency Use Authorization (EUA).  This EUA will remain in effect (meaning this test can be used) for the duration of the COVID-19 declaration under Section 564(b)(1) of the Act, 21 U.S.C. section 360bbb-3(b)(1), unless the authorization is terminated or revoked sooner. Performed at [facility], [place] [address] [place], [place] [address]   MRSA PCR Screening     Status: None   Collection Time: 06/22/18 11:25 PM  Result Value Ref Range Status   MRSA by PCR NEGATIVE NEGATIVE Final    Comment:        The GeneXpert MRSA Assay (FDA approved for NASAL specimens only), is one  component of a comprehensive MRSA colonization surveillance program. It is not intended to diagnose MRSA infection nor to guide or monitor treatment for MRSA infections. Performed at [facility], [place] [address], [place], [place] [address]       Radiology Studies: No results found.  Taye T. [facility] Pager [phone]  If 7PM-7AM, please contact night-coverage [email or web address] Password [email or web address] 06/28/2018, 11:10 AM

## 2018-06-28 NOTE — Progress Notes (Addendum)
[place] VALVE TEAM  Patient Name: Tanya [name] Date of Encounter: 06/28/2018  Primary Cardiologist: Dr. Delila Pereyra Problem List     Principal Problem:   Acute on chronic combined systolic and diastolic CHF (congestive heart failure) ([place]) Active Problems:   Acute hypercapnic respiratory failure (HCC)   DKA (diabetic ketoacidoses) (HCC)   Elevated brain natriuretic peptide (BNP) level   Neutrophilic leukocytosis   Urine incontinence   Severe aortic stenosis   PAF (paroxysmal atrial fibrillation) (HCC)   OSA on CPAP   Neuropathy   HTN (hypertension)   Diabetes (HCC)   Asthma   Lymphedema of left arm   Morbid obesity ([place])   Acute on chronic respiratory failure with hypoxia and hypercapnia (HCC)   Demand ischemia (HCC)   Moderate mitral stenosis   Moderate pulmonary arterial systolic hypertension (HCC)     Subjective   Feeling well. Had chest pain and shortness of breath with palpations. Feeling better  Inpatient Medications    Scheduled Meds:  aspirin  81 mg Per Tube Daily   chlorhexidine gluconate (MEDLINE KIT)  15 mL Mouth Rinse BID   furosemide  40 mg Oral Daily   insulin aspart  0-15 Units Subcutaneous TID WC   insulin aspart  0-5 Units Subcutaneous QHS   insulin detemir  14 Units Subcutaneous BID   insulin starter kit- pen needles  1 kit Other Once   living well with diabetes book   Does not apply Once   [START ON 06/29/2018] metoprolol tartrate  50 mg Oral UD   polyethylene glycol  17 g Oral Daily   potassium chloride  40 mEq Oral Daily   sodium chloride flush  3 mL Intravenous Q12H   Continuous Infusions:  sodium chloride 250 mL (06/26/18 2202)   amiodarone 60 mg/hr (06/28/18 0545)   Followed by   amiodarone     heparin 1,150 Units/hr (06/28/18 0618)   magnesium sulfate 1 - 4 g bolus IVPB 2 g (06/28/18 0827)   PRN Meds: sodium chloride, acetaminophen, albuterol, dextrose,  hydrALAZINE, nitroGLYCERIN, ondansetron (ZOFRAN) IV, sodium chloride flush   Vital Signs    Vitals:   06/28/18 0730 06/28/18 0800 06/28/18 0807 06/28/18 0828  BP: 100/68     Pulse: 65   61  Resp: 16 (!) 24 (!) 21 13  Temp: 98.2 F (36.8 C)     TempSrc: Oral     SpO2: 97% 99%    Weight:   86.9 kg   Height:   _0  (1.422 m)     Intake/Output Summary (Last 24 hours) at 06/28/2018 0922 Last data filed at 06/28/2018 0821 Gross per 24 hour  Intake 664.87 ml  Output 900 ml  Net -235.13 ml   Filed Weights   06/27/18 0402 06/27/18 0423 06/28/18 0807  Weight: 86.3 kg 86.9 kg 86.9 kg    Physical Exam   GEN: Well nourished, well developed, in no acute distress. Morbidly obese HEENT: Grossly normal.  Neck: Supple, no JVD, or masses. Cardiac: RRR, 3/6 harsh SEM @ RUSB. No rubs, or gallops. No clubbing, cyanosis, edema.  Radials/DP/PT 2+ and equal bilaterally.  Respiratory:  Respirations regular and unlabored, clear to auscultation bilaterally. GI: Soft, nontender, nondistended, BS + x 4. MS: no deformity or atrophy. Skin: warm and dry, no rash. Neuro:  Strength and sensation are intact. Psych: AAOx3.  Normal affect.  Labs    CBC Recent Labs    06/27/18 0526 06/28/18  0318  WBC 8.7 7.0  HGB 12.6 12.3  HCT 38.6 37.2  MCV 96.3 94.4  PLT 318 629   Basic Metabolic Panel Recent Labs    06/27/18 0526 06/28/18 0318  NA 139 136  K 4.4 4.1  CL 98 98  CO2 29 31  GLUCOSE 171* 167*  BUN 11 13  CREATININE 0.70 0.64  CALCIUM 9.3 9.4  MG 1.6* 1.6*   Liver Function Tests No results for input(s): AST, ALT, ALKPHOS, BILITOT, PROT, ALBUMIN in the last 72 hours. No results for input(s): LIPASE, AMYLASE in the last 72 hours. Cardiac Enzymes No results for input(s): CKTOTAL, CKMB, CKMBINDEX, TROPONINI in the last 72 hours. BNP Invalid input(s): POCBNP D-Dimer No results for input(s): DDIMER in the last 72 hours. Hemoglobin A1C No results for input(s): HGBA1C in the last 72  hours. Fasting Lipid Panel Recent Labs    06/25/18 1223  CHOL 176  HDL 55  LDLCALC 84  TRIG 187*  CHOLHDL 3.2   Thyroid Function Tests No results for input(s): TSH, T4TOTAL, T3FREE, THYROIDAB in the last 72 hours.  Invalid input(s): FREET3  Telemetry    Sinus brady with runs of afib with RVR vs SVT  - Personally Reviewed  ECG    afib with RVR, LBBB HR 166 - Personally Reviewed  Radiology    Ct Coronary Morph W/cta Cor W/score W/ca W/cm &/or Wo/cm  Addendum Date: 06/28/2018   ADDENDUM REPORT: 06/28/2018 08:04 CLINICAL DATA:  73 year old female with severe aortic stenosis being evaluated for a TAVR procedure. EXAM: Cardiac TAVR CT TECHNIQUE: The patient was scanned on a [employer]. A 120 kV retrospective scan was triggered in the descending thoracic aorta at 111 HU's. Gantry rotation speed was 250 msecs and collimation was .6 mm. No beta blockade or nitro were given. The 3D data set was reconstructed in 5% intervals of the R-R cycle. Systolic and diastolic phases were analyzed on a dedicated work station using MPR, MIP and VRT modes. The patient received 80 cc of contrast. FINDINGS: Aortic Valve: Trileaflet aortic valve with only moderately thickened and mildly calcified leaflets and minimal calcifications extending into the LVOT. Leaflet opening is severely restricted. Aorta: Normal size, mild diffuse atherosclerotic plaque and calcifications and no dissection. Sinotubular Junction: 26 x 25 mm Ascending Thoracic Aorta: 31 x 31 mm Aortic Arch: 27 x 25 mm Descending Thoracic Aorta: 25 x 23 mm Sinus of Valsalva Measurements: Non-coronary: 31 mm Right -coronary: 28 mm Left -coronary: 33 mm Coronary Artery Height above Annulus: Left Main: 8.2 mm Right Coronary: 11 mm Virtual Basal Annulus Measurements: Maximum/Minimum Diameter: 24.2 x 18.0 mm Mean Diameter: 20.6 mm Perimeter: 66.2 mm Area: 332 mm2 Optimum Fluoroscopic Angle for Delivery: LAO 1 CAU 0 IMPRESSION: 1. Trileaflet aortic  valve with only moderately thickened and mildly calcified leaflets and minimal calcifications extending into the LVOT. Leaflet opening is severely restricted. Annular measurements suitable for delivery of a 23 mm Edwards-SAPIEN 3 valve (lower end for measurements). 2. Sufficient coronary to annulus distance. 3. Optimum Fluoroscopic Angle for Delivery:  LAO 1 CAU 0 4. No thrombus in a large left atrial appendage. 5. Severe mitral annular calcifications. 6. Severely dilated pulmonary artery measuring 40 mm consistent with pulmonary hypertension. Electronically Signed   By: Ena Dawley   On: 06/28/2018 08:04   Result Date: 06/28/2018 EXAM: OVER-READ INTERPRETATION  CT CHEST The following report is an over-read performed by radiologist Dr. Vinnie Langton of [facility], [place] on 06/28/2018. This over-read does  not include interpretation of cardiac or coronary anatomy or pathology. The coronary calcium score/coronary CTA interpretation by the cardiologist is attached. COMPARISON:  None. FINDINGS: Extracardiac findings will be described separately under dictation for contemporaneously obtained CTA chest, abdomen and pelvis. IMPRESSION: Please see separate dictation for contemporaneously obtained CTA chest, abdomen and pelvis 06/26/2018 for full description of relevant extracardiac findings. Electronically Signed: By: Vinnie Langton M.D. On: 06/28/2018 07:40   Ct Angio Chest Aorta W/cm &/or Wo/cm  Result Date: 06/28/2018 CLINICAL DATA:  73 year old female with history of severe aortic stenosis. Preprocedural study prior to potential transcatheter aortic valve replacement (TAVR) procedure. EXAM: CT ANGIOGRAPHY CHEST, ABDOMEN AND PELVIS TECHNIQUE: Multidetector CT imaging through the chest, abdomen and pelvis was performed using the standard protocol during bolus administration of intravenous contrast. Multiplanar reconstructed images and MIPs were obtained and reviewed to evaluate the vascular anatomy.  CONTRAST:  154m OMNIPAQUE IOHEXOL 350 MG/ML SOLN COMPARISON:  No priors. FINDINGS: CTA CHEST FINDINGS Cardiovascular: Heart size is borderline enlarged. There is no significant pericardial fluid, thickening or pericardial calcification. There is aortic atherosclerosis, as well as atherosclerosis of the great vessels of the mediastinum and the coronary arteries, including calcified atherosclerotic plaque in the left main and left anterior descending coronary arteries. Thickening and calcification of the aortic valve. There is also severe thickening of the mitral valve and mitral annulus. Dilatation of the pulmonic trunk (3.6 cm in diameter). Mediastinum/Lymph Nodes: No pathologically enlarged mediastinal or hilar lymph nodes. Esophagus is unremarkable in appearance. No axillary lymphadenopathy. Lungs/Pleura: Several tiny calcified granulomas are scattered throughout both lungs. Other tiny 2-3 mm pulmonary nodules are also noted in the periphery of the lungs, nonspecific, but statistically likely benign areas of mucoid impaction. No larger more suspicious appearing pulmonary nodules or masses are noted. No acute consolidative airspace disease. Small left and small to moderate right pleural effusions lying dependently with some associated passive subsegmental atelectasis in the dependent portions of the lower lobes of the lungs bilaterally. Musculoskeletal/Soft Tissues: Status post left shoulder arthroplasty. There are no aggressive appearing lytic or blastic lesions noted in the visualized portions of the skeleton. Status post left modified radical mastectomy. CTA ABDOMEN AND PELVIS FINDINGS Hepatobiliary: No suspicious cystic or solid hepatic lesions. No intra or extrahepatic biliary ductal dilatation. Gallbladder is normal in appearance. Pancreas: No pancreatic mass. No pancreatic ductal dilatation. No pancreatic or peripancreatic fluid or inflammatory changes. Spleen: Unremarkable. Adrenals/Urinary Tract:  Subcentimeter low-attenuation lesions in the kidneys bilaterally, too small to characterize, but statistically likely to represent tiny cysts. Bilateral adrenal glands are normal in appearance. No hydroureteronephrosis. Foley balloon catheter in the lumen of the urinary bladder. Small amount of gas non dependently in the lumen of the urinary bladder is iatrogenic. Urinary bladder is otherwise normal in appearance. Stomach/Bowel: Unenhanced appearance of the stomach is normal. No pathologic dilatation of small bowel or colon. Numerous colonic diverticulae are noted, particularly in the sigmoid colon, without surrounding inflammatory changes to suggest an acute diverticulitis at this time. Normal appendix. Vascular/Lymphatic: Vascular findings and measurements pertinent to potential TAVR procedure, as detailed below. Aortic atherosclerosis, without evidence of aneurysm or dissection in the abdominal or pelvic vasculature. No lymphadenopathy noted in the abdomen or pelvis. Reproductive: Status post hysterectomy. Ovaries are not confidently identified may be surgically absent or atrophic. Other: Small supraumbilical ventral hernia containing a short segment of mid small bowel, without evidence of bowel incarceration or obstruction at this time. No significant volume of ascites. No pneumoperitoneum. Musculoskeletal: There are no  aggressive appearing lytic or blastic lesions noted in the visualized portions of the skeleton. VASCULAR MEASUREMENTS PERTINENT TO TAVR: AORTA: Minimal Aortic Diameter-12 x 11 mm Severity of Aortic Calcification-mild RIGHT PELVIS: Right Common Iliac Artery - Minimal Diameter-8.3 x 7.8 mm Tortuosity-mild Calcification-mild Right External Iliac Artery - Minimal Diameter-5.9 x 5.2 mm Tortuosity-moderate Calcification-none Right Common Femoral Artery - Minimal Diameter-6.1 x 5.6 mm Tortuosity-mild Calcification-minimal LEFT PELVIS: Left Common Iliac Artery - Minimal Diameter-7.1 x 7.0 mm  Tortuosity-mild Calcification-mild Left External Iliac Artery - Minimal Diameter-5.7 x 5.2 mm Tortuosity-moderate Calcification-none Left Common Femoral Artery - Minimal Diameter-5.3 x 5.3 mm Tortuosity-mild Calcification-none Review of the MIP images confirms the above findings. IMPRESSION: 1. Vascular findings and measurements pertinent to potential TAVR procedure, as detailed above. 2. Severe thickening calcification of the aortic valve, compatible with the reported clinical history of severe aortic stenosis. There is also severe calcifications of the mitral valve and mitral annulus. 3. Dilatation of the pulmonic trunk (3.6 cm in diameter), concerning for pulmonary arterial hypertension. 4. Small left and small to moderate right pleural effusions lying dependently with some associated passive subsegmental atelectasis in the lower lobes of the lungs bilaterally. 5. Multiple tiny 2-3 mm pulmonary nodules scattered throughout the periphery of the lungs bilaterally, nonspecific, but strongly favored to represent benign areas of mucoid impaction within terminal bronchioles. No follow-up needed if patient is low-risk (and has no known or suspected primary neoplasm). Non-contrast chest CT can be considered in 12 months if patient is high-risk. This recommendation follows the consensus statement: Guidelines for Management of Incidental Pulmonary Nodules Detected on CT Images: From the Fleischner Society 2017; Radiology 2017; 284:228-243. 6. Colonic diverticulosis without evidence of acute diverticulitis at this time. 7. Small epigastric ventral hernia, containing a short segment of mid small bowel, without evidence of bowel incarceration or obstruction at this time. 8. Additional incidental findings, as above. Electronically Signed   By: Vinnie Langton M.D.   On: 06/28/2018 08:18   Ct Angio Abd/pel W/ And/or W/o  Result Date: 06/28/2018 CLINICAL DATA:  73 year old female with history of severe aortic stenosis.  Preprocedural study prior to potential transcatheter aortic valve replacement (TAVR) procedure. EXAM: CT ANGIOGRAPHY CHEST, ABDOMEN AND PELVIS TECHNIQUE: Multidetector CT imaging through the chest, abdomen and pelvis was performed using the standard protocol during bolus administration of intravenous contrast. Multiplanar reconstructed images and MIPs were obtained and reviewed to evaluate the vascular anatomy. CONTRAST:  16m OMNIPAQUE IOHEXOL 350 MG/ML SOLN COMPARISON:  No priors. FINDINGS: CTA CHEST FINDINGS Cardiovascular: Heart size is borderline enlarged. There is no significant pericardial fluid, thickening or pericardial calcification. There is aortic atherosclerosis, as well as atherosclerosis of the great vessels of the mediastinum and the coronary arteries, including calcified atherosclerotic plaque in the left main and left anterior descending coronary arteries. Thickening and calcification of the aortic valve. There is also severe thickening of the mitral valve and mitral annulus. Dilatation of the pulmonic trunk (3.6 cm in diameter). Mediastinum/Lymph Nodes: No pathologically enlarged mediastinal or hilar lymph nodes. Esophagus is unremarkable in appearance. No axillary lymphadenopathy. Lungs/Pleura: Several tiny calcified granulomas are scattered throughout both lungs. Other tiny 2-3 mm pulmonary nodules are also noted in the periphery of the lungs, nonspecific, but statistically likely benign areas of mucoid impaction. No larger more suspicious appearing pulmonary nodules or masses are noted. No acute consolidative airspace disease. Small left and small to moderate right pleural effusions lying dependently with some associated passive subsegmental atelectasis in the dependent portions of the lower  lobes of the lungs bilaterally. Musculoskeletal/Soft Tissues: Status post left shoulder arthroplasty. There are no aggressive appearing lytic or blastic lesions noted in the visualized portions of the  skeleton. Status post left modified radical mastectomy. CTA ABDOMEN AND PELVIS FINDINGS Hepatobiliary: No suspicious cystic or solid hepatic lesions. No intra or extrahepatic biliary ductal dilatation. Gallbladder is normal in appearance. Pancreas: No pancreatic mass. No pancreatic ductal dilatation. No pancreatic or peripancreatic fluid or inflammatory changes. Spleen: Unremarkable. Adrenals/Urinary Tract: Subcentimeter low-attenuation lesions in the kidneys bilaterally, too small to characterize, but statistically likely to represent tiny cysts. Bilateral adrenal glands are normal in appearance. No hydroureteronephrosis. Foley balloon catheter in the lumen of the urinary bladder. Small amount of gas non dependently in the lumen of the urinary bladder is iatrogenic. Urinary bladder is otherwise normal in appearance. Stomach/Bowel: Unenhanced appearance of the stomach is normal. No pathologic dilatation of small bowel or colon. Numerous colonic diverticulae are noted, particularly in the sigmoid colon, without surrounding inflammatory changes to suggest an acute diverticulitis at this time. Normal appendix. Vascular/Lymphatic: Vascular findings and measurements pertinent to potential TAVR procedure, as detailed below. Aortic atherosclerosis, without evidence of aneurysm or dissection in the abdominal or pelvic vasculature. No lymphadenopathy noted in the abdomen or pelvis. Reproductive: Status post hysterectomy. Ovaries are not confidently identified may be surgically absent or atrophic. Other: Small supraumbilical ventral hernia containing a short segment of mid small bowel, without evidence of bowel incarceration or obstruction at this time. No significant volume of ascites. No pneumoperitoneum. Musculoskeletal: There are no aggressive appearing lytic or blastic lesions noted in the visualized portions of the skeleton. VASCULAR MEASUREMENTS PERTINENT TO TAVR: AORTA: Minimal Aortic Diameter-12 x 11 mm Severity of  Aortic Calcification-mild RIGHT PELVIS: Right Common Iliac Artery - Minimal Diameter-8.3 x 7.8 mm Tortuosity-mild Calcification-mild Right External Iliac Artery - Minimal Diameter-5.9 x 5.2 mm Tortuosity-moderate Calcification-none Right Common Femoral Artery - Minimal Diameter-6.1 x 5.6 mm Tortuosity-mild Calcification-minimal LEFT PELVIS: Left Common Iliac Artery - Minimal Diameter-7.1 x 7.0 mm Tortuosity-mild Calcification-mild Left External Iliac Artery - Minimal Diameter-5.7 x 5.2 mm Tortuosity-moderate Calcification-none Left Common Femoral Artery - Minimal Diameter-5.3 x 5.3 mm Tortuosity-mild Calcification-none Review of the MIP images confirms the above findings. IMPRESSION: 1. Vascular findings and measurements pertinent to potential TAVR procedure, as detailed above. 2. Severe thickening calcification of the aortic valve, compatible with the reported clinical history of severe aortic stenosis. There is also severe calcifications of the mitral valve and mitral annulus. 3. Dilatation of the pulmonic trunk (3.6 cm in diameter), concerning for pulmonary arterial hypertension. 4. Small left and small to moderate right pleural effusions lying dependently with some associated passive subsegmental atelectasis in the lower lobes of the lungs bilaterally. 5. Multiple tiny 2-3 mm pulmonary nodules scattered throughout the periphery of the lungs bilaterally, nonspecific, but strongly favored to represent benign areas of mucoid impaction within terminal bronchioles. No follow-up needed if patient is low-risk (and has no known or suspected primary neoplasm). Non-contrast chest CT can be considered in 12 months if patient is high-risk. This recommendation follows the consensus statement: Guidelines for Management of Incidental Pulmonary Nodules Detected on CT Images: From the Fleischner Society 2017; Radiology 2017; 284:228-243. 6. Colonic diverticulosis without evidence of acute diverticulitis at this time. 7. Small  epigastric ventral hernia, containing a short segment of mid small bowel, without evidence of bowel incarceration or obstruction at this time. 8. Additional incidental findings, as above. Electronically Signed   By: Vinnie Langton M.D.   On:  06/28/2018 08:18    Cardiac Studies   Cardiac catheterization 06/25/2018:  Left dominant coronary anatomy with normal coronary arteries.  Severe aortic stenosis.  Moderate mitral stenosis.  Normal pulmonary artery pressures  Normal filling pressures.LVEDP 13 mmHg and pulmonary capillary wedge mean 15 mmHg.  Severe mitral annular calcification and to a lesser extent aortic valve calcification on cine fluoroscopy.  RECOMMENDATIONS:   Discussed with Dr. Sherren Mocha.  Consideration is being given to TAVR.  _____________   Echocardiogram 06/23/2018: 1. The left ventricle has mild-moderately reduced systolic function, with an ejection fraction of 40-45%. The cavity size was normal. Left ventricular diastolic function could not be evaluated. Left ventricular diffuse hypokinesis. 2. The right ventricle has normal systolic function. The cavity was normal. There is no increase in right ventricular wall thickness. Right ventricular systolic pressure is moderately elevated with an estimated pressure of 64.6 mmHg. 3. Left atrial size was moderately dilated. 4. Right atrial size was moderately dilated. 5. The mitral valve is degenerative. Mild thickening of the mitral valve leaflet. There is moderate mitral annular calcification present. Mitral valve regurgitation is moderate by color flow Doppler. Moderate mitral valve stenosis. 6. The aortic valve is tricuspid. Severely thickening of the aortic valve. Severe calcifcation of the aortic valve. Aortic valve regurgitation is moderate by color flow Doppler. Severe stenosis of the aortic valve. 7. Pulmonary hypertension is moderate.  _____________   Cardiac CT 06/26/18 IMPRESSION: 1.  Trileaflet aortic valve with only moderately thickened and mildly calcified leaflets and minimal calcifications extending into the LVOT. Leaflet opening is severely restricted. Annular measurements suitable for delivery of a 23 mm Edwards-SAPIEN 3 valve (lower end for measurements).  2. Sufficient coronary to annulus distance.  3. Optimum Fluoroscopic Angle for Delivery:  LAO 1 CAU 0  4. No thrombus in a large left atrial appendage.  5. Severe mitral annular calcifications.  6. Severely dilated pulmonary artery measuring 40 mm consistent with pulmonary hypertension.  _____________  CTA chest/abd/pelvis 06/26/18 IMPRESSION: 1. Vascular findings and measurements pertinent to potential TAVR procedure, as detailed above. 2. Severe thickening calcification of the aortic valve, compatible with the reported clinical history of severe aortic stenosis. There is also severe calcifications of the mitral valve and mitral annulus. 3. Dilatation of the pulmonic trunk (3.6 cm in diameter), concerning for pulmonary arterial hypertension. 4. Small left and small to moderate right pleural effusions lying dependently with some associated passive subsegmental atelectasis in the lower lobes of the lungs bilaterally. 5. Multiple tiny 2-3 mm pulmonary nodules scattered throughout the periphery of the lungs bilaterally, nonspecific, but strongly favored to represent benign areas of mucoid impaction within terminal bronchioles. No follow-up needed if patient is low-risk (and has no known or suspected primary neoplasm). Non-contrast chest CT can be considered in 12 months if patient is high-risk. This recommendation follows the consensus statement: Guidelines for Management of Incidental Pulmonary Nodules Detected on CT Images: From the Fleischner Society 2017; Radiology 2017; 284:228-243. 6. Colonic diverticulosis without evidence of acute diverticulitis at this time. 7. Small epigastric  ventral hernia, containing a short segment of mid small bowel, without evidence of bowel incarceration or obstruction at this time. 8. Additional incidental findings, as above.    Patient Profile     Tanya [name] is a 73 y.o. female with a hx of HTN, DMT2, HLD, morbid obesity, PAF on eliquis, breast cancer s/p chemo and left radical mastectomy (1990s), ovarian cancer s/p chemo s/p radical hysterectomy (2010), left arm lymphedema, COPD on 2L  02, LBBB, OSA on CPAP, history of septic arthritis on chronic doxycycline and aortic stenosis who presented [facility] on [facility]/22/18 with acute on chronichypoxic and hypercapnicrespiratory failure in the setting of acute CHF 2/2 severe aortic stenosis and moderate mitral stenosis/regurgitation.  Assessment & Plan    Severe AS: plan is for tentative TAVR tomorrow. She sized to a 23 mm Edwards sapien with adequate access for TF approach. PT assessment today. I will place inpatient TAVR orders now.   PAF: had runs of afib with RVR from overnight. Required oral metoprolol with the addition of IV amio bolus with drip. Will convert this to oral amio now. HR in sinus brady. On IV heparin. Mg+ supplemented as well. Plan to resume home eliquis and Toprol XL after TAVR if rate and rhythm are stable.   Acute on chronic CHF: net neg 5.5L. Weight down 8 lbs (199--> 191lbs). Now on oral lasix 71m daily. Creat remains normal.   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 Plan to treat medically.   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   HTN: BP on soft side.  Pulmonary nodules: noted on pre TAVR CT scan. With 30 year pack history of smoking, she will need follow up CT scan in 12 months. I will follow this as an outpatient.    Signed, KAngelena Form PA-C  06/28/2018, 9:22 AM  Pager [phone] Patient seen, examined. Available data reviewed. Agree with findings, assessment, and plan as outlined by KNell Range  PA-C.  The patient is independently interviewed and examined.  She is alert and oriented, in no distress.  She is sitting up at the bedside.  She had a rough night with atrial fibrillation with RVR and associated chest discomfort and shortness of breath.  Fortunately, she converted back to sinus rhythm on IV amiodarone at about 5:30 AM today.  She has felt better since that time.  She denies chest pain or shortness of breath at rest at the time of my evaluation.  She is alert, oriented, in no distress.  Lung fields are clear.  Heart is regular rate and rhythm with a grade 3/6 harsh late peaking systolic murmur at the right upper sternal border.  JVP is normal.  Abdomen is soft and nontender.  Extremities have no edema.  Telemetry is reviewed and shows atrial fibrillation with RVR overnight, with conversion to sinus rhythm early this morning, now in sinus rhythm with a heart rate of 60 to 65 bpm.  I have personally reviewed the patient's CTA studies, echo images, and cardiac catheterization images.  I have discussed her case with Dr. ORoxy Manns  We both agree that she is a candidate for TAVR via a transfemoral approach.  She appears stable for surgery tomorrow.  I have reviewed the risks, indications, and alternatives to TAVR with the patient at length.  I have also discussed the same risks with her daughter over the telephone.  Following TAVR, I would anticipate placing her back on a direct oral anticoagulant drug along with aspirin 81 mg daily.  I would continue IV amiodarone perioperatively to prevent recurrent atrial fibrillation with RVR.  All of her questions are answered today.  She will be made n.p.o. after midnight for surgery tomorrow.  I agree with the documentation above related to the patient's other issues.  MSherren Mocha M.D. 06/28/2018 2:51 PM

## 2018-06-28 NOTE — Progress Notes (Signed)
Inpatient Diabetes Program Recommendations  AACE/ADA: New Consensus Statement on Inpatient Glycemic Control (2015)  Target Ranges:  Prepandial:   less than 140 mg/dL      Peak postprandial:   less than 180 mg/dL (1-2 hours)      Critically ill patients:  140 - 180 mg/dL   Results for Tanya [name], Tanya [name] (MRN [id number]) as of 06/28/2018 11:47  Ref. Range 06/27/2018 06:30 06/27/2018 11:17 06/27/2018 16:14 06/27/2018 21:39 06/28/2018 06:14 06/28/2018 11:10  Glucose-Capillary Latest Ref Range: 70 - 99 mg/dL 189 (H) 172 (H) 149 (H) 201 (H) 206 (H) 272 (H)   Review of Glycemic Control  Diabetes history: DM 2 Outpatient Diabetes medications: Metformin 1000 mg BID Current orders for Inpatient glycemic control: Levemir 14 units bid, Novolog 0-15 units tid, Novolog 0-5 units qhs  Inpatient Diabetes Program Recommendations:    Glucose trends increase after meals. Consider Novolog 2-3 units tid meal coverage (assuming patient is consuming at least 50% of meals).  Thanks,  Tama Headings RN, MSN, BC-ADM Inpatient Diabetes Coordinator Team Pager [phone] (8a-5p)

## 2018-06-28 NOTE — Progress Notes (Signed)
ANTICOAGULATION CONSULT NOTE - follow up  Pharmacy Consult for heparin Indication: atrial fibrillation  Allergies  Allergen Reactions  . Pneumovax [Pneumococcal Polysaccharide Vaccine] Swelling    Arm swelling  . Sulfa Antibiotics Other (See Comments)    UNSPECIFIED CHILDHOOD REACTION  . Levaquin [Levofloxacin] Rash    Patient Measurements: Height: _0  (142.2 cm) Weight: 191 lb 9.3 oz (86.9 kg) IBW/kg (Calculated) : 36.3 Heparin Dosing Weight: 75kg BMI = 42.95 kg/m2   Vital Signs: Temp: 98.4 F (36.9 C) (04/20 1337) Temp Source: Oral (04/20 1337) BP: 131/79 (04/20 1337) Pulse Rate: 69 (04/20 1337)  Labs: Recent Labs    06/26/18 0620 06/27/18 0526 06/28/18 0318 06/28/18 1449 06/28/18 1532 06/28/18 2030  HGB 12.1 12.6 12.3  --   --   --   HCT 37.9 38.6 37.2  --   --   --   PLT 285 318 296  --   --   --   LABPROT  --   --   --   --  13.2  --   INR  --   --   --   --  1.0  --   HEPARINUNFRC  --   --   --  0.40  --  0.36  CREATININE 0.59 0.70 0.64  --   --   --     Estimated Creatinine Clearance: 54.2 mL/min (by C-G formula based on SCr of 0.64 mg/dL).   Medical History: Past Medical History:  Diagnosis Date  . Asthma   . Breast cancer in female [facility])    1994 s/p chemo and radical L mastectomy  . Diabetes ([place])   . DVT (deep venous thrombosis) (H[facility])    in shoulder after shoulder replacement  . Full dentures   . GERD (gastroesophageal reflux disease)   . Gout   . H/O total shoulder replacement   . History of knee replacement, total, bilateral   . 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 now on daily doxycycline.  Marland Kitchen HTN (hypertension)   . Lymphedema of left arm    s/p mastectomy with lymph node removal for breast cancer  . Morbid obesity ([place])   . Neuropathy   . OSA on CPAP   . Ovarian cancer ([place])    2012 s/p chemo and radical hysterectomy  . PAF (paroxysmal atrial fibrillation) (HCC)    on  Eliquis  . Pulmonary nodules    seen on pre TAVR CT scan. needs follow up CT in 06/2019  . Severe aortic stenosis   . Urine incontinence     Medications:  Medications Prior to Admission  Medication Sig Dispense Refill Last Dose  . acetaminophen (TYLENOL) 325 MG tablet Take 650 mg by mouth every 6 (six) hours as needed for mild pain or fever.   Past Month at Unknown time  . apixaban (ELIQUIS) 5 MG TABS tablet Take 5 mg by mouth 2 (two) times daily.   Past Week at Unknown time  . atorvastatin (LIPITOR) 40 MG tablet Take 40 mg by mouth daily.   Past Week at Unknown time  . azelastine (ASTELIN) 0.1 % nasal spray Place 1 spray into both nostrils 2 (two) times daily. Use in each nostril as directed   Past Week at Unknown time  . calcium carbonate (OS-CAL - DOSED IN MG OF ELEMENTAL CALCIUM) 1250 (500 Ca) MG tablet Take 1 tablet by mouth 2 (two) times daily with a meal.  Past Week at Unknown time  . doxycycline (VIBRA-TABS) 100 MG tablet Take 100 mg by mouth 2 (two) times daily.   Past Week at Unknown time  . DULoxetine (CYMBALTA) 30 MG capsule Take 30 mg by mouth 2 (two) times daily.   Past Week at Unknown time  . esomeprazole (NEXIUM) 40 MG capsule Take 40 mg by mouth daily at 12 noon.   Past Week at Unknown time  . fluticasone furoate-vilanterol (BREO ELLIPTA) 200-25 MCG/INH AEPB Inhale 1 puff into the lungs daily.   Past Week at Unknown time  . furosemide (LASIX) 20 MG tablet Take 30 mg by mouth daily.   Past Week at Unknown time  . isosorbide mononitrate (IMDUR) 30 MG 24 hr tablet Take 30 mg by mouth daily.   Past Week at Unknown time  . magnesium 30 MG tablet Take 30 mg by mouth 2 (two) times daily.   Past Week at Unknown time  . metFORMIN (GLUCOPHAGE) 500 MG tablet Take 1,000 mg by mouth 2 (two) times daily with a meal.   Past Week at Unknown time  . metoprolol succinate (TOPROL-XL) 25 MG 24 hr tablet Take 25 mg by mouth daily.   Past Week at Unknown time  . montelukast (SINGULAIR) 10 MG  tablet Take 10 mg by mouth at bedtime.   Past Week at Unknown time  . Multiple Vitamin (MULTIVITAMIN WITH MINERALS) TABS tablet Take 1 tablet by mouth daily.   Past Week at Unknown time  . oxybutynin (DITROPAN-XL) 10 MG 24 hr tablet Take 10 mg by mouth at bedtime.   Past Week at Unknown time  . predniSONE (DELTASONE) 10 MG tablet Take 10-40 mg by mouth See admin instructions. Take as directed by mouth: 4 tablets for 4 days 3 tablets for 4 days 2 tablets for 4 days 1 tablets for 4 days   Past Week at Unknown time  . sitaGLIPtin (JANUVIA) 25 MG tablet Take 25 mg by mouth daily.   Past Week at Unknown time  . vitamin B-12 (CYANOCOBALAMIN) 100 MCG tablet Take 100 mcg by mouth daily.   Past Week at Unknown time  . vitamin E 100 UNIT capsule Take 100 Units by mouth daily.   Past Week at Unknown time   Scheduled:  . aspirin  81 mg Per Tube Daily  . bisacodyl  5 mg Oral Once  . chlorhexidine  1 application Topical Once  . [START ON 06/29/2018] chlorhexidine  15 mL Mouth/Throat Once  . chlorhexidine gluconate (MEDLINE KIT)  15 mL Mouth Rinse BID  . doxycycline  100 mg Oral BID  . furosemide  40 mg Oral Daily  . insulin aspart  0-15 Units Subcutaneous TID WC  . insulin aspart  0-5 Units Subcutaneous QHS  . insulin detemir  14 Units Subcutaneous BID  . insulin starter kit- pen needles  1 kit Other Once  . living well with diabetes book   Does not apply Once  . [START ON 06/29/2018] magnesium sulfate  40 mEq Other To OR  . polyethylene glycol  17 g Oral Daily  . [START ON 06/29/2018] potassium chloride  80 mEq Other To OR  . potassium chloride  40 mEq Oral Daily  . sodium chloride flush  3 mL Intravenous Q12H   Infusions:  . sodium chloride 250 mL (06/26/18 2202)  . amiodarone    . amiodarone 30 mg/hr (06/28/18 1610)  . [START ON 06/29/2018] cefUROXime (ZINACEF)  IV    . [START ON 06/29/2018] dexmedetomidine    . [  START ON 06/29/2018] heparin 30,000 units/NS 1000 mL solution for CELLSAVER    .  heparin 1,150 Units/hr (06/28/18 1606)  . [START ON 06/29/2018] norepinephrine (LEVOPHED) Adult infusion    . [START ON 06/29/2018] vancomycin      Assessment: 73yo female admitted 4/14 for acute respiratory failure and septic shock, was intubated w/ presumed cardiac component in the context of severe aortic stenosis >> proceeded to cardiac cath to assess valve disease >> tentatively scheduled for TAVR this week, transferred 4/17 to progressive unit.  Overnight had bursts of SVT and Vtac and eventually went into Afib w/ RVR 4/19-4/20/20, thus start heparin drip and IV amiodarone for AFib.  Mali VASc score 6.  Morbidly obese with BMI = 42.9 Converted to SR.   Heparin level remains therapeutic, no adjustments needed.   Goal of Therapy:  Heparin level 0.3-0.7 units/ml Monitor platelets by anticoagulation protocol: Yes   Plan:  Continue IV heparin drip at 1150 units/hr and monitor Daily heparin level and CBC  Arrie Senate, PharmD, BCPS Clinical Pharmacist [phone] Please check AMION for all [place] numbers 06/28/2018

## 2018-06-28 NOTE — Evaluation (Signed)
Physical Therapy Evaluation Patient Details Name: Tanya [name] MRN: [id number] DOB: 1945/04/21 Today's Date: 06/28/2018   History of Present Illness  73 y.o. female with a hx of HTN, DMT2, HLD, morbid obesity, PAF on eliquis, breast cancer s/p chemo and left radical mastectomy (1990s), ovarian cancer s/p chemo s/p radical hysterectomy (2010), left arm lymphedema, COPD on 2L 02, LBBB, OSA on CPAP, history of septic arthritis on chronic doxycycline and aortic stenosis who presented to [facility] with acute on chronic hypoxic and hypercapnic respiratory failure in the setting of acute CHF 2/2 severe aortic stenosis and moderate mitral stenosis. Intubated 4/14-4/15. Scheduled for TAVR 4/21  Clinical Impression  PTA pt living alone and independent with iADLs, and ambulating with cane. Pt currently is mod I for bed mobility, minA-min guard with transfers and min guard with ambulation with RW. Pt limited in safe mobility by increased chest tightness with activity as well as decreased strength and endurance. Performed Pre-TAVR assessment and results recorded below. Pt plans to live with daughter after surgery and will have 24 hour care. PT recommends HHPT level rehab to improve mobility. PT will continue to follow acutely.   Pre-TAVR Assessment  6 Minute Walk Test  Distance - 289 ft      Pre    Post  BP     131/79    127/54  HR    70 bpm   81 bpm  SaO2 On 3L via [place]  95%    96%  Modified Borg Dyspnea 4    7  RPE    7    15    5  Meter Walk Test  Trial   1) 16.04 sec   2) 18.53 sec  3) 17.02 sec  Average - 17.19 sec = 1.04 ft/sec   Clinical Frailty Scale - 6              Follow Up Recommendations Home health PT;Supervision/Assistance - 24 hour    Equipment Recommendations  None recommended by PT    Recommendations for Other Services       Precautions / Restrictions Precautions Precautions: None Restrictions Weight Bearing Restrictions: No      Mobility  Bed Mobility Overal bed mobility: Modified Independent             General bed mobility comments: HOB elevated and use of bed rails to get to EoB  Transfers Overall transfer level: Needs assistance Equipment used: Rolling walker (2 wheeled) Transfers: Sit to/from Stand Sit to Stand: Min assist;Min guard         General transfer comment: minA for power up initially, min guard for subsequent sit>stand   Ambulation/Gait Ambulation/Gait assistance: Min guard Gait Distance (Feet): 350 Feet Assistive device: Rolling walker (2 wheeled) Gait Pattern/deviations: Step-through pattern;Decreased step length - right;Decreased step length - left;Shuffle;Trunk flexed Gait velocity: slowed Gait velocity interpretation: <1.31 ft/sec, indicative of household ambulator General Gait Details: min guard for safety, with slow, shuffling gait, mild instability, with no overt LoB, requires 1x seated rest break during 6 minute walk and rest before 5 meter walk test         Balance Overall balance assessment: Needs assistance Sitting-balance support: Feet unsupported;No upper extremity supported Sitting balance-Leahy Scale: Fair     Standing balance support: No upper extremity supported Standing balance-Leahy Scale: Fair  Pertinent Vitals/Pain Pain Assessment: 0-10 Pain Score: 4  Pain Location: chest Pain Descriptors / Indicators: Tightness Pain Intervention(s): Limited activity within patient's tolerance;Monitored during session;Repositioned    Home Living Family/patient expects to be discharged to:: Private residence Living Arrangements: Children Available Help at Discharge: Family;Available 24 hours/day Type of Home: House Home Access: Stairs to enter Entrance Stairs-Rails: None Entrance Stairs-Number of Steps: 2 Home Layout: Two level;Other (Comment)(chair lift being installed) Home Equipment: Shower seat;Grab bars - tub/shower;Walker - 2  wheels;Walker - 4 wheels;Grab bars - toilet;Transport chair;Bedside commode      Prior Function Level of Independence: Independent with assistive device(s)                  Extremity/Trunk Assessment   Upper Extremity Assessment Upper Extremity Assessment: Generalized weakness    Lower Extremity Assessment Lower Extremity Assessment: Generalized weakness    Cervical / Trunk Assessment Cervical / Trunk Assessment: Kyphotic  Communication   Communication: No difficulties  Cognition Arousal/Alertness: Awake/alert Behavior During Therapy: WFL for tasks assessed/performed Overall Cognitive Status: Within Functional Limits for tasks assessed                                        General Comments General comments (skin integrity, edema, etc.): Pt on 3L O2 throughout session with SaO2 >90%O2 throughout        Assessment/Plan    PT Assessment Patient needs continued PT services  PT Problem List Decreased activity tolerance;Decreased mobility;Cardiopulmonary status limiting activity       PT Treatment Interventions DME instruction;Gait training;Functional mobility training;Therapeutic activities;Therapeutic exercise;Balance training;Cognitive remediation;Patient/family education    PT Goals (Current goals can be found in the Care Plan section)  Acute Rehab PT Goals Patient Stated Goal: feel better PT Goal Formulation: With patient Time For Goal Achievement: 07/12/18 Potential to Achieve Goals: Fair    Frequency Min 3X/week    AM-PAC PT "6 Clicks" Mobility  Outcome Measure Help needed turning from your back to your side while in a flat bed without using bedrails?: None Help needed moving from lying on your back to sitting on the side of a flat bed without using bedrails?: None Help needed moving to and from a bed to a chair (including a wheelchair)?: None Help needed standing up from a chair using your arms (e.g., wheelchair or bedside chair)?:  None Help needed to walk in hospital room?: None Help needed climbing 3-5 steps with a railing? : A Little 6 Click Score: 23    End of Session Equipment Utilized During Treatment: Gait belt;Oxygen Activity Tolerance: Patient tolerated treatment well Patient left: in chair;with call bell/phone within reach Nurse Communication: Mobility status PT Visit Diagnosis: Unsteadiness on feet (R26.81);Muscle weakness (generalized) (M62.81);Difficulty in walking, not elsewhere classified (R26.2);Pain Pain - part of body: (chest)    Time: 3016-0109 PT Time Calculation (min) (ACUTE ONLY): 53 min   Charges:   PT Evaluation $PT Eval Moderate Complexity: 1 Mod PT Treatments $Gait Training: 38-52 mins        Evangelynn Lochridge B. Migdalia Dk PT, DPT Acute Rehabilitation Services Pager [phone] Office [phone]   [place] 06/28/2018, 4:45 PM

## 2018-06-28 NOTE — Progress Notes (Signed)
Attempted to call patient's family using available the phone numbers in the chart but no answer.  One of the numbers seems to be company phone.

## 2018-06-28 NOTE — Progress Notes (Signed)
0111: pt had burst of SVT, CCMD called. When went to access pt, she was sleeping . pt was back to NSR with HR of 77. Denied any pain or SOB. Breathing even and unlabored in 2l 02 via [place]. Pt requested 3l o2 and said that's what she prefers while sleeping, o2 sat was 100%. Pt stated she didn't fell anything. BP 124/44.  0209:  Pt had another burst of SVT with HR in 200's and 16 beat of Vtac. When went in the room, pt was sleeping. Pt was back in normal rhythm with HR in 80's. Denied any pain or SOB, o2 sat 100% in 3l o2. BP 147/59.  0223: Paged on call triad MD, pt went Tacy HR 130-150's while paging MD.  0231: Tanya Corpus NP called back, notified of the events and asked if she would want to give her anything Prn as pt went to SVT 3 times in last 1 hour. She said since pt's didn't sutain the rhythm will check her lab in am and go from there.  Currently pt is sleeping without any discomfort. Unable to get 12 lead EKG because rhythm didn't last . Will continue to monitor.

## 2018-06-28 NOTE — Progress Notes (Signed)
Pt's HR went up, 12 lead Ekg showed Afib rvr, HR 140'2 to 200's. breathing even and unlabored in RA. BP 137/102. Pt denies pain or Sob, AOx4.  Paged on call trid at [phone] and [phone] waiting on call back.

## 2018-06-28 NOTE — Progress Notes (Signed)
9163: paged on call cardiology again.  0505: DR. Ignatius Specking called back. Notified MD that pt's HR is still 150-209. Pt complained of headache and chest pressure 4/10. Received order for Amio bolus and drip and heparin drip per pharmacy . Will continue to monitor.

## 2018-06-28 NOTE — Progress Notes (Signed)
ANTICOAGULATION CONSULT NOTE - Initial Consult  Pharmacy Consult for heparin Indication: atrial fibrillation  Allergies  Allergen Reactions  . Pneumovax [Pneumococcal Polysaccharide Vaccine] Swelling    Arm swelling  . Sulfa Antibiotics Other (See Comments)    Childhood allergy Reaction unknown  . Levaquin [Levofloxacin] Rash    Patient Measurements: Height: '5\' 2"'  (157.5 cm) Weight: 191 lb 9.3 oz (86.9 kg) IBW/kg (Calculated) : 50.1 Heparin Dosing Weight: 75kg  Vital Signs: Temp: 98 F (36.7 C) (04/20 0321) Temp Source: Oral (04/20 0321) BP: 148/117 (04/20 0412) Pulse Rate: 174 (04/20 0412)  Labs: Recent Labs    06/26/18 0620 06/27/18 0526 06/28/18 0318  HGB 12.1 12.6 12.3  HCT 37.9 38.6 37.2  PLT 285 318 296  CREATININE 0.59 0.70 0.64    Estimated Creatinine Clearance: 62.2 mL/min (by C-G formula based on SCr of 0.64 mg/dL).   Medical History: Past Medical History:  Diagnosis Date  . Asthma   . Breast cancer in female [facility])    1994 s/p chemo and radical L mastectomy  . Diabetes ([place])   . DVT (deep venous thrombosis) (HCC)    in shoulder after shoulder replacement  . Full dentures   . GERD (gastroesophageal reflux disease)   . Gout   . H/O total shoulder replacement   . History of knee replacement, total, bilateral   . 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 now on daily doxycycline.  Marland Kitchen HTN (hypertension)   . Lymphedema of left arm    s/p mastectomy with lymph node removal for breast cancer  . Morbid obesity ([place])   . Neuropathy   . OSA on CPAP   . Ovarian cancer ([place])    2012 s/p chemo and radical hysterectomy  . PAF (paroxysmal atrial fibrillation) (HCC)    on Eliquis  . Severe aortic stenosis   . Urine incontinence     Medications:  Medications Prior to Admission  Medication Sig Dispense Refill Last Dose  . acetaminophen (TYLENOL) 325 MG tablet Take 650 mg by mouth every 6 (six) hours  as needed for mild pain or fever.   Past Month at Unknown time  . apixaban (ELIQUIS) 5 MG TABS tablet Take 5 mg by mouth 2 (two) times daily.   Past Week at Unknown time  . atorvastatin (LIPITOR) 40 MG tablet Take 40 mg by mouth daily.   Past Week at Unknown time  . azelastine (ASTELIN) 0.1 % nasal spray Place 1 spray into both nostrils 2 (two) times daily. Use in each nostril as directed   Past Week at Unknown time  . calcium carbonate (OS-CAL - DOSED IN MG OF ELEMENTAL CALCIUM) 1250 (500 Ca) MG tablet Take 1 tablet by mouth 2 (two) times daily with a meal.   Past Week at Unknown time  . doxycycline (VIBRA-TABS) 100 MG tablet Take 100 mg by mouth 2 (two) times daily.   Past Week at Unknown time  . DULoxetine (CYMBALTA) 30 MG capsule Take 30 mg by mouth 2 (two) times daily.   Past Week at Unknown time  . esomeprazole (NEXIUM) 40 MG capsule Take 40 mg by mouth daily at 12 noon.   Past Week at Unknown time  . fluticasone furoate-vilanterol (BREO ELLIPTA) 200-25 MCG/INH AEPB Inhale 1 puff into the lungs daily.   Past Week at Unknown time  . furosemide (LASIX) 20 MG tablet Take 30 mg by mouth daily.   Past Week at Unknown time  .  isosorbide mononitrate (IMDUR) 30 MG 24 hr tablet Take 30 mg by mouth daily.   Past Week at Unknown time  . magnesium 30 MG tablet Take 30 mg by mouth 2 (two) times daily.   Past Week at Unknown time  . metFORMIN (GLUCOPHAGE) 500 MG tablet Take 1,000 mg by mouth 2 (two) times daily with a meal.   Past Week at Unknown time  . metoprolol succinate (TOPROL-XL) 25 MG 24 hr tablet Take 25 mg by mouth daily.   Past Week at Unknown time  . montelukast (SINGULAIR) 10 MG tablet Take 10 mg by mouth at bedtime.   Past Week at Unknown time  . Multiple Vitamin (MULTIVITAMIN WITH MINERALS) TABS tablet Take 1 tablet by mouth daily.   Past Week at Unknown time  . oxybutynin (DITROPAN-XL) 10 MG 24 hr tablet Take 10 mg by mouth at bedtime.   Past Week at Unknown time  . predniSONE (DELTASONE) 10  MG tablet Take 10-40 mg by mouth See admin instructions. Take as directed by mouth: 4 tablets for 4 days 3 tablets for 4 days 2 tablets for 4 days 1 tablets for 4 days   Past Week at Unknown time  . sitaGLIPtin (JANUVIA) 25 MG tablet Take 25 mg by mouth daily.   Past Week at Unknown time  . vitamin B-12 (CYANOCOBALAMIN) 100 MCG tablet Take 100 mcg by mouth daily.   Past Week at Unknown time  . vitamin E 100 UNIT capsule Take 100 Units by mouth daily.   Past Week at Unknown time   Scheduled:  . aspirin  81 mg Per Tube Daily  . chlorhexidine gluconate (MEDLINE KIT)  15 mL Mouth Rinse BID  . furosemide  40 mg Oral Daily  . insulin aspart  0-15 Units Subcutaneous TID WC  . insulin aspart  0-5 Units Subcutaneous QHS  . insulin detemir  14 Units Subcutaneous BID  . insulin starter kit- pen needles  1 kit Other Once  . living well with diabetes book   Does not apply Once  . [START ON 06/29/2018] metoprolol tartrate  50 mg Oral UD  . polyethylene glycol  17 g Oral Daily  . potassium chloride  40 mEq Oral Daily  . sodium chloride flush  3 mL Intravenous Q12H   Infusions:  . sodium chloride 250 mL (06/26/18 2202)  . amiodarone      Assessment: 73yo female admitted 4/14 for acute respiratory failure and septic shock, was intubated w/ presumed cardiac component in the context of severe aortic stenosis >> proceeded to cardiac cath to assess valve disease >> tentatively scheduled for TAVR this week, now tx'd to progressive unit, overnight had bursts of SVT and Vtac and eventually went into Afib w/ RVR w/ rates to 200s, now to start heparin.  Goal of Therapy:  Heparin level 0.3-0.7 units/ml Monitor platelets by anticoagulation protocol: Yes   Plan:  Rec'd last dose of LMWH DVT Px yesterday am; will give heparin 3000 units IV followed by gtt at 1150 units/hr and monitor heparin levels and CBC.  Wynona Neat, PharmD, BCPS  06/28/2018,5:24 AM

## 2018-06-29 ENCOUNTER — Inpatient Hospital Stay (HOSPITAL_COMMUNITY): Payer: Medicare Other

## 2018-06-29 ENCOUNTER — Inpatient Hospital Stay (HOSPITAL_COMMUNITY): Payer: Medicare Other | Admitting: Certified Registered Nurse Anesthetist

## 2018-06-29 ENCOUNTER — Other Ambulatory Visit: Payer: Self-pay | Admitting: Physician Assistant

## 2018-06-29 ENCOUNTER — Encounter (HOSPITAL_COMMUNITY)
Admission: EM | Disposition: A | Discharge status: Home / Self Care | Payer: Self-pay | Source: Home / Self Care | Attending: Student

## 2018-06-29 ENCOUNTER — Encounter (HOSPITAL_COMMUNITY): Payer: Self-pay | Admitting: Certified Registered Nurse Anesthetist

## 2018-06-29 DIAGNOSIS — Z952 Presence of prosthetic heart valve: Secondary | ICD-10-CM

## 2018-06-29 DIAGNOSIS — I350 Nonrheumatic aortic (valve) stenosis: Secondary | ICD-10-CM

## 2018-06-29 DIAGNOSIS — Z006 Encounter for examination for normal comparison and control in clinical research program: Secondary | ICD-10-CM

## 2018-06-29 HISTORY — PX: TEE WITHOUT CARDIOVERSION: SHX5443

## 2018-06-29 HISTORY — PX: TRANSCATHETER AORTIC VALVE REPLACEMENT, TRANSFEMORAL: SHX6400

## 2018-06-29 LAB — MAGNESIUM: Magnesium: 1.6 mg/dL — ABNORMAL LOW (ref 1.7–2.4)

## 2018-06-29 LAB — POCT I-STAT 7, (LYTES, BLD GAS, ICA,H+H)
Acid-Base Excess: 1 mmol/L (ref 0.0–2.0)
Bicarbonate: 27.5 mmol/L (ref 20.0–28.0)
Calcium, Ion: 1.18 mmol/L (ref 1.15–1.40)
HCT: 32 % — ABNORMAL LOW (ref 36.0–46.0)
Hemoglobin: 10.9 g/dL — ABNORMAL LOW (ref 12.0–15.0)
O2 Saturation: 98 %
Potassium: 4.6 mmol/L (ref 3.5–5.1)
Sodium: 138 mmol/L (ref 135–145)
TCO2: 29 mmol/L (ref 22–32)
pCO2 arterial: 49 mmHg — ABNORMAL HIGH (ref 32.0–48.0)
pH, Arterial: 7.357 (ref 7.350–7.450)
pO2, Arterial: 105 mmHg (ref 83.0–108.0)

## 2018-06-29 LAB — ECHOCARDIOGRAM LIMITED
Height: 56 in
Weight: 3047.64 oz

## 2018-06-29 LAB — CBC
HCT: 38.2 % (ref 36.0–46.0)
Hemoglobin: 12.6 g/dL (ref 12.0–15.0)
MCH: 31.2 pg (ref 26.0–34.0)
MCHC: 33 g/dL (ref 30.0–36.0)
MCV: 94.6 fL (ref 80.0–100.0)
Platelets: 311 10*3/uL (ref 150–400)
RBC: 4.04 MIL/uL (ref 3.87–5.11)
RDW: 14.2 % (ref 11.5–15.5)
WBC: 9.4 10*3/uL (ref 4.0–10.5)
nRBC: 0 % (ref 0.0–0.2)

## 2018-06-29 LAB — GLUCOSE, CAPILLARY
Glucose-Capillary: 329 mg/dL — ABNORMAL HIGH (ref 70–99)
Glucose-Capillary: 181 mg/dL — ABNORMAL HIGH (ref 70–99)
Glucose-Capillary: 181 mg/dL — ABNORMAL HIGH (ref 70–99)
Glucose-Capillary: 159 mg/dL — ABNORMAL HIGH (ref 70–99)
Glucose-Capillary: 159 mg/dL — ABNORMAL HIGH (ref 70–99)
Glucose-Capillary: 197 mg/dL — ABNORMAL HIGH (ref 70–99)

## 2018-06-29 LAB — POCT I-STAT CREATININE: Creatinine, Ser: 0.6 mg/dL (ref 0.44–1.00)

## 2018-06-29 LAB — BASIC METABOLIC PANEL
Anion gap: 13 (ref 5–15)
BUN: 16 mg/dL (ref 8–23)
CO2: 25 mmol/L (ref 22–32)
Calcium: 9.5 mg/dL (ref 8.9–10.3)
Chloride: 101 mmol/L (ref 98–111)
Creatinine, Ser: 0.71 mg/dL (ref 0.44–1.00)
GFR calc Af Amer: >60 mL/min (ref >60)
GFR calc non Af Amer: >60 mL/min (ref >60)
Glucose, Bld: 161 mg/dL — ABNORMAL HIGH (ref 70–99)
Potassium: 4.3 mmol/L (ref 3.5–5.1)
Sodium: 139 mmol/L (ref 135–145)

## 2018-06-29 LAB — POCT I-STAT 4, (NA,K, GLUC, HGB,HCT)
Glucose, Bld: 179 mg/dL — ABNORMAL HIGH (ref 70–99)
HCT: 36 % (ref 36.0–46.0)
Hemoglobin: 12.2 g/dL (ref 12.0–15.0)
Potassium: 4.4 mmol/L (ref 3.5–5.1)
Sodium: 138 mmol/L (ref 135–145)

## 2018-06-29 LAB — HEPARIN LEVEL (UNFRACTIONATED): Heparin Unfractionated: 0.53 IU/mL (ref 0.30–0.70)

## 2018-06-29 SURGERY — IMPLANTATION, AORTIC VALVE, TRANSCATHETER, FEMORAL APPROACH
Anesthesia: Monitor Anesthesia Care | Site: Chest

## 2018-06-29 MED ORDER — ONDANSETRON HCL 4 MG/2ML IJ SOLN
INTRAMUSCULAR | Status: AC
  Filled 2018-06-29: qty 4

## 2018-06-29 MED ORDER — EPHEDRINE 5 MG/ML INJ
INTRAVENOUS | Status: AC
  Filled 2018-06-29: qty 10

## 2018-06-29 MED ORDER — SODIUM CHLORIDE 0.9 % IV SOLN
INTRAVENOUS | Status: DC | PRN
  Administered 2018-06-29: 11:00:00 40 ug/min via INTRAVENOUS

## 2018-06-29 MED ORDER — HEPARIN SODIUM (PORCINE) 1000 UNIT/ML IJ SOLN
INTRAMUSCULAR | Status: DC | PRN
  Administered 2018-06-29: 12000 [IU] via INTRAVENOUS

## 2018-06-29 MED ORDER — SODIUM CHLORIDE 0.9 % IV SOLN
INTRAVENOUS | Status: AC
  Filled 2018-06-29 (×3): qty 1.2

## 2018-06-29 MED ORDER — ASPIRIN 81 MG PO CHEW
81.0000 mg | CHEWABLE_TABLET | Freq: Every day | ORAL | Status: DC
  Administered 2018-06-30: 10:00:00 81 mg via ORAL
  Filled 2018-06-29: qty 1

## 2018-06-29 MED ORDER — ACETAMINOPHEN 325 MG PO TABS
650.0000 mg | ORAL_TABLET | Freq: Four times a day (QID) | ORAL | Status: DC | PRN
  Administered 2018-06-29 – 2018-06-30 (×3): 650 mg via ORAL
  Filled 2018-06-29 (×3): qty 2

## 2018-06-29 MED ORDER — HEPARIN SODIUM (PORCINE) 1000 UNIT/ML IJ SOLN
INTRAMUSCULAR | Status: AC
  Filled 2018-06-29: qty 2

## 2018-06-29 MED ORDER — SODIUM CHLORIDE 0.9 % IV SOLN: 250.0000 mL | INTRAVENOUS | Status: DC | PRN

## 2018-06-29 MED ORDER — MIDAZOLAM HCL 2 MG/2ML IJ SOLN
INTRAMUSCULAR | Status: AC
  Filled 2018-06-29: qty 2

## 2018-06-29 MED ORDER — OXYCODONE HCL 5 MG PO TABS: 5.0000 mg | ORAL_TABLET | ORAL | Status: DC | PRN

## 2018-06-29 MED ORDER — SODIUM CHLORIDE 0.9% FLUSH
3.0000 mL | Freq: Two times a day (BID) | INTRAVENOUS | Status: DC
  Administered 2018-06-29 – 2018-06-30 (×2): 3 mL via INTRAVENOUS

## 2018-06-29 MED ORDER — SODIUM CHLORIDE 0.9 % IV SOLN
1.5000 g | Freq: Two times a day (BID) | INTRAVENOUS | Status: DC
  Administered 2018-06-29 – 2018-06-30 (×2): 1.5 g via INTRAVENOUS
  Filled 2018-06-29 (×4): qty 1.5

## 2018-06-29 MED ORDER — FLUTICASONE FUROATE-VILANTEROL 200-25 MCG/INH IN AEPB
1.0000 | INHALATION_SPRAY | Freq: Every day | RESPIRATORY_TRACT | Status: DC
  Administered 2018-06-30: 1 via RESPIRATORY_TRACT
  Filled 2018-06-29: qty 28

## 2018-06-29 MED ORDER — OXYBUTYNIN CHLORIDE ER 10 MG PO TB24
10.0000 mg | ORAL_TABLET | Freq: Every day | ORAL | Status: DC
  Filled 2018-06-29: qty 1

## 2018-06-29 MED ORDER — DEXMEDETOMIDINE HCL IN NACL 400 MCG/100ML IV SOLN
INTRAVENOUS | Status: DC | PRN
  Administered 2018-06-29: 1 ug/kg/h via INTRAVENOUS

## 2018-06-29 MED ORDER — AZELASTINE HCL 0.1 % NA SOLN: 1.0000 | Freq: Two times a day (BID) | NASAL | Status: DC

## 2018-06-29 MED ORDER — TRAMADOL HCL 50 MG PO TABS: 50.0000 mg | ORAL_TABLET | ORAL | Status: DC | PRN

## 2018-06-29 MED ORDER — MONTELUKAST SODIUM 10 MG PO TABS: 10.0000 mg | ORAL_TABLET | Freq: Every day | ORAL | Status: DC

## 2018-06-29 MED ORDER — EPHEDRINE SULFATE 50 MG/ML IJ SOLN
INTRAMUSCULAR | Status: DC | PRN
  Administered 2018-06-29 (×2): 5 mg via INTRAVENOUS

## 2018-06-29 MED ORDER — PHENYLEPHRINE 40 MCG/ML (10ML) SYRINGE FOR IV PUSH (FOR BLOOD PRESSURE SUPPORT)
PREFILLED_SYRINGE | INTRAVENOUS | Status: AC
  Filled 2018-06-29: qty 10

## 2018-06-29 MED ORDER — DULOXETINE HCL 30 MG PO CPEP
30.0000 mg | ORAL_CAPSULE | Freq: Two times a day (BID) | ORAL | Status: DC
  Administered 2018-06-30: 10:00:00 30 mg via ORAL
  Filled 2018-06-29: qty 1

## 2018-06-29 MED ORDER — SODIUM CHLORIDE 0.9 % IV SOLN
INTRAVENOUS | Status: DC | PRN
  Administered 2018-06-29: 11:00:00

## 2018-06-29 MED ORDER — VANCOMYCIN HCL IN DEXTROSE 1-5 GM/200ML-% IV SOLN
1000.0000 mg | Freq: Once | INTRAVENOUS | Status: AC
  Administered 2018-06-29: 22:00:00 1000 mg via INTRAVENOUS
  Filled 2018-06-29 (×2): qty 200

## 2018-06-29 MED ORDER — PROTAMINE SULFATE 10 MG/ML IV SOLN
INTRAVENOUS | Status: DC | PRN
  Administered 2018-06-29 (×4): 30 mg via INTRAVENOUS

## 2018-06-29 MED ORDER — PHENYLEPHRINE HCL (PRESSORS) 10 MG/ML IV SOLN
INTRAVENOUS | Status: DC | PRN
  Administered 2018-06-29: 80 ug via INTRAVENOUS

## 2018-06-29 MED ORDER — MORPHINE SULFATE (PF) 2 MG/ML IV SOLN: 1.0000 mg | INTRAVENOUS | Status: DC | PRN

## 2018-06-29 MED ORDER — LIDOCAINE HCL 1 % IJ SOLN
INTRAMUSCULAR | Status: DC | PRN
  Administered 2018-06-29: 10 mL

## 2018-06-29 MED ORDER — ATORVASTATIN CALCIUM 40 MG PO TABS
40.0000 mg | ORAL_TABLET | Freq: Every day | ORAL | Status: DC
  Administered 2018-06-30: 40 mg via ORAL
  Filled 2018-06-29: qty 1

## 2018-06-29 MED ORDER — PANTOPRAZOLE SODIUM 40 MG PO TBEC
40.0000 mg | DELAYED_RELEASE_TABLET | Freq: Every day | ORAL | Status: DC
  Administered 2018-06-30: 40 mg via ORAL
  Filled 2018-06-29: qty 1

## 2018-06-29 MED ORDER — SODIUM CHLORIDE 0.9% FLUSH: 3.0000 mL | INTRAVENOUS | Status: DC | PRN

## 2018-06-29 MED ORDER — FENTANYL CITRATE (PF) 250 MCG/5ML IJ SOLN
INTRAMUSCULAR | Status: AC
  Filled 2018-06-29: qty 5

## 2018-06-29 MED ORDER — IODIXANOL 320 MG/ML IV SOLN
INTRAVENOUS | Status: DC | PRN
  Administered 2018-06-29: 56.1 mL via INTRAVENOUS

## 2018-06-29 MED ORDER — PHENYLEPHRINE HCL-NACL 20-0.9 MG/250ML-% IV SOLN: 0.0000 ug/min | INTRAVENOUS | Status: DC

## 2018-06-29 MED ORDER — ACETAMINOPHEN 650 MG RE SUPP: 650.0000 mg | Freq: Four times a day (QID) | RECTAL | Status: DC | PRN

## 2018-06-29 MED ORDER — LIDOCAINE HCL (PF) 1 % IJ SOLN
INTRAMUSCULAR | Status: AC
  Filled 2018-06-29: qty 30

## 2018-06-29 MED ORDER — SODIUM CHLORIDE 0.9 % IV SOLN
INTRAVENOUS | Status: AC
  Filled 2018-06-29: qty 1.2

## 2018-06-29 MED ORDER — PROTAMINE SULFATE 10 MG/ML IV SOLN
INTRAVENOUS | Status: AC
  Filled 2018-06-29: qty 50

## 2018-06-29 MED ORDER — LACTATED RINGERS IV SOLN
INTRAVENOUS | Status: DC | PRN
  Administered 2018-06-29 (×2): via INTRAVENOUS

## 2018-06-29 MED ORDER — NITROGLYCERIN IN D5W 200-5 MCG/ML-% IV SOLN: 0.0000 ug/min | INTRAVENOUS | Status: DC

## 2018-06-29 MED ORDER — SODIUM CHLORIDE 0.9 % IV SOLN
INTRAVENOUS | Status: AC
  Administered 2018-06-29: 13:00:00 via INTRAVENOUS

## 2018-06-29 MED ORDER — MIDAZOLAM HCL 2 MG/2ML IJ SOLN
INTRAMUSCULAR | Status: DC | PRN
  Administered 2018-06-29: 0.5 mg via INTRAVENOUS

## 2018-06-29 MED ORDER — ONDANSETRON HCL 4 MG/2ML IJ SOLN: 4.0000 mg | Freq: Four times a day (QID) | INTRAMUSCULAR | Status: DC | PRN

## 2018-06-29 MED ORDER — 0.9 % SODIUM CHLORIDE (POUR BTL) OPTIME
TOPICAL | Status: DC | PRN
  Administered 2018-06-29: 11:00:00 1000 mL

## 2018-06-29 MED FILL — Magnesium Sulfate Inj 50%: INTRAMUSCULAR | Qty: 10 | Status: AC

## 2018-06-29 MED FILL — Potassium Chloride Inj 2 mEq/ML: INTRAVENOUS | Qty: 40 | Status: AC

## 2018-06-29 MED FILL — Heparin Sodium (Porcine) Inj 1000 Unit/ML: INTRAMUSCULAR | Qty: 30 | Status: AC

## 2018-06-29 SURGICAL SUPPLY — 92 items
ADH SKN CLS APL DERMABOND .7 (GAUZE/BANDAGES/DRESSINGS) ×2
BAG DECANTER FOR FLEXI CONT (MISCELLANEOUS) IMPLANT
BAG SNAP BAND KOVER 36X36 (MISCELLANEOUS) ×6 IMPLANT
BLADE CLIPPER SURG (BLADE) IMPLANT
BLADE STERNUM SYSTEM 6 (BLADE) IMPLANT
BLADE SURG 10 STRL SS (BLADE) ×3 IMPLANT
CABLE ADAPT CONN TEMP 6FT (ADAPTER) ×3 IMPLANT
CANISTER SUCT 3000ML PPV (MISCELLANEOUS) IMPLANT
CANNULA FEM VENOUS REMOTE 22FR (CANNULA) IMPLANT
CANNULA OPTISITE PERFUSION 16F (CANNULA) IMPLANT
CANNULA OPTISITE PERFUSION 18F (CANNULA) IMPLANT
CATH DIAG EXPO 6F VENT PIG 145 (CATHETERS) ×6 IMPLANT
CATH EXPO 5FR AL1 (CATHETERS) ×1 IMPLANT
CATH EXTERNAL FEMALE PUREWICK (CATHETERS) IMPLANT
CATH INFINITI 6F AL2 (CATHETERS) IMPLANT
CATH S G BIP PACING (CATHETERS) ×3 IMPLANT
CHLORAPREP W/TINT 26ML (MISCELLANEOUS) ×3 IMPLANT
CLIP VESOCCLUDE MED 24/CT (CLIP) ×2 IMPLANT
CLIP VESOCCLUDE SM WIDE 24/CT (CLIP) ×2 IMPLANT
CONT SPEC 4OZ CLIKSEAL STRL BL (MISCELLANEOUS) ×6 IMPLANT
COVER BACK TABLE 80X110 HD (DRAPES) IMPLANT
COVER DOME SNAP 22 D (MISCELLANEOUS) IMPLANT
COVER WAND RF STERILE (DRAPES) ×2 IMPLANT
CRADLE DONUT ADULT HEAD (MISCELLANEOUS) ×3 IMPLANT
DECANTER SPIKE VIAL GLASS SM (MISCELLANEOUS) ×3 IMPLANT
DERMABOND ADVANCED (GAUZE/BANDAGES/DRESSINGS) ×1
DERMABOND ADVANCED .7 DNX12 (GAUZE/BANDAGES/DRESSINGS) ×2 IMPLANT
DEVICE CLOSURE PERCLS PRGLD 6F (VASCULAR PRODUCTS) ×4 IMPLANT
DRAPE INCISE IOBAN 66X45 STRL (DRAPES) IMPLANT
DRSG TEGADERM 4X4.75 (GAUZE/BANDAGES/DRESSINGS) ×5 IMPLANT
ELECT CAUTERY BLADE 6.4 (BLADE) IMPLANT
ELECT REM PT RETURN 9FT ADLT (ELECTROSURGICAL) ×3
ELECTRODE REM PT RTRN 9FT ADLT (ELECTROSURGICAL) ×4 IMPLANT
FELT TEFLON 6X6 (MISCELLANEOUS) ×2 IMPLANT
FEMORAL VENOUS CANN RAP (CANNULA) IMPLANT
GAUZE SPONGE 4X4 12PLY STRL (GAUZE/BANDAGES/DRESSINGS) ×3 IMPLANT
GLOVE BIO SURGEON STRL SZ7.5 (GLOVE) IMPLANT
GLOVE BIO SURGEON STRL SZ8 (GLOVE) IMPLANT
GLOVE EUDERMIC 7 POWDERFREE (GLOVE) IMPLANT
GLOVE ORTHO TXT STRL SZ7.5 (GLOVE) IMPLANT
GOWN STRL REUS W/ TWL LRG LVL3 (GOWN DISPOSABLE) IMPLANT
GOWN STRL REUS W/ TWL XL LVL3 (GOWN DISPOSABLE) ×2 IMPLANT
GOWN STRL REUS W/TWL LRG LVL3 (GOWN DISPOSABLE)
GOWN STRL REUS W/TWL XL LVL3 (GOWN DISPOSABLE) ×3
GUIDEWIRE SAF TJ AMPL .035X180 (WIRE) ×3 IMPLANT
GUIDEWIRE SAFE TJ AMPLATZ EXST (WIRE) ×3 IMPLANT
GUIDEWIRE STRAIGHT .035 260CM (WIRE) ×3 IMPLANT
INSERT FOGARTY SM (MISCELLANEOUS) IMPLANT
KIT BASIN OR (CUSTOM PROCEDURE TRAY) ×3 IMPLANT
KIT DILATOR VASC 18G NDL (KITS) IMPLANT
KIT HEART LEFT (KITS) ×3 IMPLANT
KIT SUCTION CATH 14FR (SUCTIONS) ×6 IMPLANT
KIT TURNOVER KIT B (KITS) ×3 IMPLANT
LOOP VESSEL MAXI BLUE (MISCELLANEOUS) IMPLANT
LOOP VESSEL MINI RED (MISCELLANEOUS) IMPLANT
NEEDLE 22X1 1/2 (OR ONLY) (NEEDLE) ×3 IMPLANT
NS IRRIG 1000ML POUR BTL (IV SOLUTION) ×3 IMPLANT
PACK ENDO MINOR (CUSTOM PROCEDURE TRAY) ×3 IMPLANT
PAD ARMBOARD 7.5X6 YLW CONV (MISCELLANEOUS) ×6 IMPLANT
PAD ELECT DEFIB RADIOL ZOLL (MISCELLANEOUS) ×3 IMPLANT
PENCIL BUTTON HOLSTER BLD 10FT (ELECTRODE) IMPLANT
PERCLOSE PROGLIDE 6F (VASCULAR PRODUCTS) ×6
SET MICROPUNCTURE 5F STIFF (MISCELLANEOUS) ×3 IMPLANT
SHEATH BRITE TIP 6FR 35CM (SHEATH) ×3 IMPLANT
SHEATH PINNACLE 6F 10CM (SHEATH) ×3 IMPLANT
SHEATH PINNACLE 8F 10CM (SHEATH) ×3 IMPLANT
SLEEVE REPOSITIONING LENGTH 30 (MISCELLANEOUS) ×3 IMPLANT
SPONGE LAP 4X18 RFD (DISPOSABLE) ×3 IMPLANT
STOPCOCK MORSE 400PSI 3WAY (MISCELLANEOUS) ×6 IMPLANT
SUT ETHIBOND X763 2 0 SH 1 (SUTURE) IMPLANT
SUT GORETEX CV 4 TH 22 36 (SUTURE) IMPLANT
SUT GORETEX CV4 TH-18 (SUTURE) IMPLANT
SUT MNCRL AB 3-0 PS2 18 (SUTURE) IMPLANT
SUT PROLENE 5 0 C 1 36 (SUTURE) IMPLANT
SUT PROLENE 6 0 C 1 30 (SUTURE) IMPLANT
SUT SILK  1 MH (SUTURE) ×1
SUT SILK 1 MH (SUTURE) ×2 IMPLANT
SUT VIC AB 2-0 CT1 27 (SUTURE)
SUT VIC AB 2-0 CT1 TAPERPNT 27 (SUTURE) IMPLANT
SUT VIC AB 2-0 CTX 36 (SUTURE) IMPLANT
SUT VIC AB 3-0 SH 8-18 (SUTURE) IMPLANT
SYR 50ML LL SCALE MARK (SYRINGE) ×3 IMPLANT
SYR BULB IRRIGATION 50ML (SYRINGE) IMPLANT
SYR CONTROL 10ML LL (SYRINGE) IMPLANT
SYR MEDRAD MARK V 150ML (SYRINGE) ×3 IMPLANT
TOWEL GREEN STERILE (TOWEL DISPOSABLE) ×6 IMPLANT
TRANSDUCER W/STOPCOCK (MISCELLANEOUS) ×6 IMPLANT
TRAY FOLEY SLVR 14FR TEMP STAT (SET/KITS/TRAYS/PACK) IMPLANT
TUBE SUCT INTRACARD DLP 20F (MISCELLANEOUS) IMPLANT
URINAL MALE W/LID DISP 1000CC (MISCELLANEOUS) IMPLANT
VALVE HEART TRANSCATH SZ3 23MM (Valve) ×1 IMPLANT
WIRE .035 3MM-J 145CM (WIRE) ×3 IMPLANT

## 2018-06-29 NOTE — Anesthesia Postprocedure Evaluation (Signed)
Anesthesia Post Note  Patient: Tanya [name]  Procedure(s) Performed: TRANSCATHETER AORTIC VALVE REPLACEMENT, TRANSFEMORAL (N/A Chest) TRANSESOPHAGEAL ECHOCARDIOGRAM (TEE) (N/A )     Patient location during evaluation: ICU Anesthesia Type: MAC Level of consciousness: awake Pain management: pain level controlled Vital Signs Assessment: post-procedure vital signs reviewed and stable Respiratory status: spontaneous breathing Cardiovascular status: stable Postop Assessment: no apparent nausea or vomiting    Last Vitals:  Vitals:   06/29/18 1350 06/29/18 1355  BP: (!) 97/30 (!) 96/28  Pulse: (!) 54 (!) 54  Resp: (!) 21 20  Temp:    SpO2: 100% 100%    Last Pain:  Vitals:   06/29/18 1312  TempSrc: Temporal  PainSc:                  Ryenne Lynam

## 2018-06-29 NOTE — Progress Notes (Signed)
ANTICOAGULATION CONSULT NOTE - follow up  Pharmacy Consult for heparin Indication: atrial fibrillation  Allergies  Allergen Reactions  . Pneumovax [Pneumococcal Polysaccharide Vaccine] Swelling    Arm swelling  . Sulfa Antibiotics Other (See Comments)    UNSPECIFIED CHILDHOOD REACTION  . Levaquin [Levofloxacin] Rash    Patient Measurements: Height: '4\' 8"'  (142.2 cm) Weight: 190 lb 7.6 oz (86.4 kg) IBW/kg (Calculated) : 36.3 Heparin Dosing Weight: 75kg BMI = 42.95 kg/m2   Vital Signs: Temp: 98.2 F (36.8 C) (04/21 0843) Temp Source: Oral (04/21 0843) BP: 140/74 (04/21 0843) Pulse Rate: 72 (04/21 0843)  Labs: Recent Labs    06/27/18 0526 06/28/18 0318 06/28/18 1449 06/28/18 1532 06/28/18 2030 06/29/18 0335  HGB 12.6 12.3  --   --   --  12.6  HCT 38.6 37.2  --   --   --  38.2  PLT 318 296  --   --   --  311  LABPROT  --   --   --  13.2  --   --   INR  --   --   --  1.0  --   --   HEPARINUNFRC  --   --  0.40  --  0.36 0.53  CREATININE 0.70 0.64  --   --   --  0.71    Estimated Creatinine Clearance: 54 mL/min (by C-G formula based on SCr of 0.71 mg/dL).   Medical History: Past Medical History:  Diagnosis Date  . Asthma   . Breast cancer in female [facility])    1994 s/p chemo and radical L mastectomy  . Diabetes ([place])   . DVT (deep venous thrombosis) (HCC)    in shoulder after shoulder replacement  . Full dentures   . GERD (gastroesophageal reflux disease)   . Gout   . H/O total shoulder replacement   . History of knee replacement, total, bilateral   . 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 now on daily doxycycline.  Marland Kitchen HTN (hypertension)   . Lymphedema of left arm    s/p mastectomy with lymph node removal for breast cancer  . Morbid obesity ([place])   . Neuropathy   . OSA on CPAP   . Ovarian cancer ([place])    2012 s/p chemo and radical hysterectomy  . PAF (paroxysmal atrial fibrillation) (HCC)    on  Eliquis  . Pulmonary nodules    seen on pre TAVR CT scan. needs follow up CT in 06/2019  . Severe aortic stenosis   . Urine incontinence     Medications:  Medications Prior to Admission  Medication Sig Dispense Refill Last Dose  . acetaminophen (TYLENOL) 325 MG tablet Take 650 mg by mouth every 6 (six) hours as needed for mild pain or fever.   Past Month at Unknown time  . apixaban (ELIQUIS) 5 MG TABS tablet Take 5 mg by mouth 2 (two) times daily.   Past Week at Unknown time  . atorvastatin (LIPITOR) 40 MG tablet Take 40 mg by mouth daily.   Past Week at Unknown time  . azelastine (ASTELIN) 0.1 % nasal spray Place 1 spray into both nostrils 2 (two) times daily. Use in each nostril as directed   Past Week at Unknown time  . calcium carbonate (OS-CAL - DOSED IN MG OF ELEMENTAL CALCIUM) 1250 (500 Ca) MG tablet Take 1 tablet by mouth 2 (two) times daily with a meal.   Past Week  at Unknown time  . doxycycline (VIBRA-TABS) 100 MG tablet Take 100 mg by mouth 2 (two) times daily.   Past Week at Unknown time  . DULoxetine (CYMBALTA) 30 MG capsule Take 30 mg by mouth 2 (two) times daily.   Past Week at Unknown time  . esomeprazole (NEXIUM) 40 MG capsule Take 40 mg by mouth daily at 12 noon.   Past Week at Unknown time  . fluticasone furoate-vilanterol (BREO ELLIPTA) 200-25 MCG/INH AEPB Inhale 1 puff into the lungs daily.   Past Week at Unknown time  . furosemide (LASIX) 20 MG tablet Take 30 mg by mouth daily.   Past Week at Unknown time  . isosorbide mononitrate (IMDUR) 30 MG 24 hr tablet Take 30 mg by mouth daily.   Past Week at Unknown time  . magnesium 30 MG tablet Take 30 mg by mouth 2 (two) times daily.   Past Week at Unknown time  . metFORMIN (GLUCOPHAGE) 500 MG tablet Take 1,000 mg by mouth 2 (two) times daily with a meal.   Past Week at Unknown time  . metoprolol succinate (TOPROL-XL) 25 MG 24 hr tablet Take 25 mg by mouth daily.   Past Week at Unknown time  . montelukast (SINGULAIR) 10 MG  tablet Take 10 mg by mouth at bedtime.   Past Week at Unknown time  . Multiple Vitamin (MULTIVITAMIN WITH MINERALS) TABS tablet Take 1 tablet by mouth daily.   Past Week at Unknown time  . oxybutynin (DITROPAN-XL) 10 MG 24 hr tablet Take 10 mg by mouth at bedtime.   Past Week at Unknown time  . predniSONE (DELTASONE) 10 MG tablet Take 10-40 mg by mouth See admin instructions. Take as directed by mouth: 4 tablets for 4 days 3 tablets for 4 days 2 tablets for 4 days 1 tablets for 4 days   Past Week at Unknown time  . sitaGLIPtin (JANUVIA) 25 MG tablet Take 25 mg by mouth daily.   Past Week at Unknown time  . vitamin B-12 (CYANOCOBALAMIN) 100 MCG tablet Take 100 mcg by mouth daily.   Past Week at Unknown time  . vitamin E 100 UNIT capsule Take 100 Units by mouth daily.   Past Week at Unknown time   Scheduled:  . aspirin  81 mg Per Tube Daily  . chlorhexidine gluconate (MEDLINE KIT)  15 mL Mouth Rinse BID  . doxycycline  100 mg Oral BID  . furosemide  40 mg Oral Daily  . insulin aspart  0-15 Units Subcutaneous TID WC  . insulin aspart  0-5 Units Subcutaneous QHS  . insulin detemir  14 Units Subcutaneous BID  . insulin starter kit- pen needles  1 kit Other Once  . living well with diabetes book   Does not apply Once  . magnesium sulfate  40 mEq Other To OR  . polyethylene glycol  17 g Oral Daily  . potassium chloride  80 mEq Other To OR  . potassium chloride  40 mEq Oral Daily  . sodium chloride flush  3 mL Intravenous Q12H   Infusions:  . sodium chloride 250 mL (06/26/18 2202)  . amiodarone 30 mg/hr (06/28/18 2235)  . amiodarone 30 mg/hr (06/28/18 1610)  . cefUROXime (ZINACEF)  IV    . dexmedetomidine    . heparin 30,000 units/NS 1000 mL solution for CELLSAVER    . heparin 1,150 Units/hr (06/28/18 1606)  . norepinephrine (LEVOPHED) Adult infusion    . vancomycin      Assessment: 73yo  female admitted 4/14 for acute respiratory failure and septic shock, was intubated w/ presumed  cardiac component in the context of severe aortic stenosis >> proceeded to cardiac cath to assess valve disease >> tentatively scheduled for TAVR this week, transferred 4/17 to progressive unit.  Overnight had bursts of SVT and Vtac and eventually went into Afib w/ RVR 4/19-4/20/20, thus start heparin drip and IV amiodarone for AFib.  Mali VASc score 6.  Morbidly obese with BMI = 42.9 Converted to SR.   Heparin level remains therapeutic on heparin rate 1150 units/hr, no adjustments needed. CBC wnl. No bleeding reported. Cardiology plan is for TAVR today 06/29/18.   Goal of Therapy:  Heparin level 0.3-0.7 units/ml Monitor platelets by anticoagulation protocol: Yes   Plan:  Continue IV heparin drip at 1150 units/hr and monitor Daily heparin level and CBC.  Plan for TAVR today- F/u anticoagulation plan s/p TAVR 4/21, cards anticipates restart Elquis +  ASA 60m daily post TAVR    RNicole Cella RPh Clinical Pharmacist [phone] check AMION for all MPlantersvillenumbers 06/29/2018

## 2018-06-29 NOTE — Progress Notes (Signed)
Rt radial arterial line d/c'ed, manual pressure held for 10 minutes. Level 0. Palpable rt radial. Dressed w/gauze and tegaderm.

## 2018-06-29 NOTE — Anesthesia Preprocedure Evaluation (Addendum)
Anesthesia Evaluation  Patient identified by MRN, date of birth, ID band Patient awake    Reviewed: Allergy & Precautions, NPO status , Patient's Chart, lab work & pertinent test results  Airway Mallampati: II  TM Distance: >3 FB     Dental   Pulmonary asthma , sleep apnea , former smoker,    breath sounds clear to auscultation       Cardiovascular hypertension, +CHF   Rhythm:Regular Rate:Normal     Neuro/Psych    GI/Hepatic Neg liver ROS, GERD  ,  Endo/Other  diabetes  Renal/GU negative Renal ROS     Musculoskeletal   Abdominal   Peds  Hematology   Anesthesia Other Findings   Reproductive/Obstetrics                             Anesthesia Physical Anesthesia Plan  ASA: III  Anesthesia Plan: MAC   Post-op Pain Management:    Induction: Intravenous  PONV Risk Score and Plan: Ondansetron, Midazolam and Treatment may vary due to age or medical condition  Airway Management Planned: Simple Face Mask  Additional Equipment:   Intra-op Plan:   Post-operative Plan:   Informed Consent: I have reviewed the patients History and Physical, chart, labs and discussed the procedure including the risks, benefits and alternatives for the proposed anesthesia with the patient or authorized representative who has indicated his/her understanding and acceptance.     Dental advisory given  Plan Discussed with: CRNA and Anesthesiologist  Anesthesia Plan Comments:         Anesthesia Quick Evaluation

## 2018-06-29 NOTE — Transfer of Care (Signed)
Immediate Anesthesia Transfer of Care Note  Patient: Tanya [name]  Procedure(s) Performed: TRANSCATHETER AORTIC VALVE REPLACEMENT, TRANSFEMORAL (N/A Chest) TRANSESOPHAGEAL ECHOCARDIOGRAM (TEE) (N/A )  Patient Location: Cath Lab  Anesthesia Type:MAC  Level of Consciousness: awake, alert  and oriented  Airway & Oxygen Therapy: Patient Spontanous Breathing and Patient connected to nasal cannula oxygen  Post-op Assessment: Report given to RN, Post -op Vital signs reviewed and stable and Patient moving all extremities  Post vital signs: Reviewed and stable  Last Vitals:  Vitals Value Taken Time  BP 87/29 06/29/2018 12:36 PM  Temp 36.1 C 06/29/2018 12:25 PM  Pulse 51 06/29/2018 12:38 PM  Resp 18 06/29/2018 12:38 PM  SpO2 100 % 06/29/2018 12:38 PM  Vitals shown include unvalidated device data.  Last Pain:  Vitals:   06/29/18 1225  TempSrc: Temporal  PainSc: 0-No pain      Patients Stated Pain Goal: 0 (44/81/85 6314)  Complications: No apparent anesthesia complications

## 2018-06-29 NOTE — Op Note (Signed)
[facility]   MULTIDISCIPLINARY HEART VALVE TEAM   TAVR OPERATIVE NOTE   Date of Procedure:  06/29/2018  Preoperative Diagnosis: Severe Aortic Stenosis   Postoperative Diagnosis: Same   Procedure:    Transcatheter Aortic Valve Replacement - Percutaneous  Transfemoral Approach  Edwards Sapien 3 THV (size 23 mm, model # 9600TFX, serial # [id number])   Co-Surgeons:  Valentina Gu. Roxy Manns, MD, MD and Sherren Mocha, MD  Anesthesiologist:  Belinda Block, MD  Echocardiographer:  Ena Dawley, MD  Pre-operative Echo Findings:  Severe aortic stenosis  Low normal left ventricular systolic function  Post-operative Echo Findings:  No paravalvular leak  unchanged left ventricular systolic function  BRIEF CLINICAL NOTE AND INDICATIONS FOR SURGERY  The patient is a 73 year old woman with obesity, valvular heart disease including aortic stenosis and mitral stenosis, paroxysmal atrial fibrillation with RVR, type 2 diabetes, breast cancer status post left radical mastectomy and chemotherapy, ovarian cancer status post chemotherapy and radical hysterectomy, chronic left arm lymphedema, 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  She presented with acute heart failure requiring mechanical ventilation because of acute on chronic respiratory failure.  The patient turned around quickly with IV diuresis.  She was found to have severe aortic stenosis and moderate mitral stenosis on preoperative evaluation.  Because of her multiple severe comorbidities outlined above, she is evaluated by multidisciplinary heart valve team and felt to be a suitable candidate for TAVR.  Treatment is performed urgently in the hospital because of the patient's tenuous clinical condition with acute on chronic diastolic heart failure.  Following the decision to proceed with transcatheter aortic valve replacement, a discussion has been held regarding what types of management  strategies would be attempted intraoperatively in the event of life-threatening complications, including whether or not the patient would be considered a candidate for the use of cardiopulmonary bypass and/or conversion to open sternotomy for attempted surgical intervention.  The patient has been advised of a variety of complications that might develop peculiar to this approach including but not limited to risks of death, stroke, paravalvular leak, aortic dissection or other major vascular complications, aortic annulus rupture, device embolization, cardiac rupture or perforation, acute myocardial infarction, arrhythmia, heart block or bradycardia requiring permanent pacemaker placement, congestive heart failure, respiratory failure, renal failure, pneumonia, infection, other late complications related to structural valve deterioration or migration, or other complications that might ultimately cause a temporary or permanent loss of functional independence or other long term morbidity.  The patient provides full informed consent for the procedure as described and all questions were answered preoperatively.  DETAILS OF THE OPERATIVE PROCEDURE  PREPARATION:   The patient is brought to the operating room on the above mentioned date and central monitoring was established by the anesthesia team including placement of a central venous catheter and radial arterial line. The patient is placed in the supine position on the operating table.  Intravenous antibiotics are administered. The patient is monitored closely throughout the procedure under conscious sedation.  Baseline transthoracic echocardiogram is performed. The patient's chest, abdomen, both groins, and both lower extremities are prepared and draped in a sterile manner. A time out procedure is performed.   PERIPHERAL ACCESS:   Using ultrasound guidance, femoral arterial and venous access is obtained with placement of 6 Fr sheaths on the left side.  A  pigtail diagnostic catheter was passed through the femoral arterial sheath under fluoroscopic guidance into the aortic root.  A temporary transvenous pacemaker catheter was  passed through the femoral venous sheath under fluoroscopic guidance into the right ventricle.  The pacemaker was tested to ensure stable lead placement and pacemaker capture. Aortic root angiography was performed in order to determine the optimal angiographic angle for valve deployment.  TRANSFEMORAL ACCESS:  A micropuncture technique is used to access the right femoral artery under fluoroscopic and ultrasound guidance.  2 Perclose devices are deployed at 10' and 2' positions to 'PreClose' the femoral artery. An 8 French sheath is placed and then an Amplatz Superstiff wire is advanced through the sheath. This is changed out for a 14 French transfemoral E-Sheath after progressively dilating over the Superstiff wire.  An AL-1 catheter was used to direct a straight-tip exchange length wire across the native aortic valve into the left ventricle. This was exchanged out for a pigtail catheter and position was confirmed in the LV apex. Simultaneous LV and Ao pressures were recorded.  The pigtail catheter was exchanged for an Amplatz Extra-stiff wire in the LV apex.  Echocardiography was utilized to confirm appropriate wire position and no sign of entanglement in the mitral subvalvular apparatus.  BALLOON AORTIC VALVULOPLASTY:  Not performed  TRANSCATHETER HEART VALVE DEPLOYMENT:  An Edwards Sapien 3 transcatheter heart valve (size 23 mm, model #9600TFX, serial #[id number]) was prepared and crimped per manufacturer's guidelines, and the proper orientation of the valve is confirmed on the [employer] delivery system. The valve was advanced through the introducer sheath using normal technique until in an appropriate position in the abdominal aorta beyond the sheath tip. The balloon was then retracted and using the fine-tuning wheel was  centered on the valve. The valve was then advanced across the aortic arch using appropriate flexion of the catheter. The valve was carefully positioned across the aortic valve annulus. The Commander catheter was retracted using normal technique. Once final position of the valve has been confirmed by angiographic assessment, the valve is deployed while temporarily holding ventilation and during rapid ventricular pacing to maintain systolic blood pressure < 50 mmHg and pulse pressure < 10 mmHg. The balloon inflation is held for >3 seconds after reaching full deployment volume. Once the balloon has fully deflated the balloon is retracted into the ascending aorta and valve function is assessed using echocardiography. There is felt to be no paravalvular leak and no central aortic insufficiency.  The patient's hemodynamic recovery following valve deployment is good.  The deployment balloon and guidewire are both removed. Echo demostrated acceptable post-procedural gradients, stable mitral valve function, and no aortic insufficiency.    PROCEDURE COMPLETION:  The sheath was removed and femoral artery closure is performed using the 2 previously deployed Perclose devices.  Protamine is administered once femoral arterial repair was complete. The site is clear with no evidence of bleeding or hematoma after the sutures are tightened. The temporary pacemaker, pigtail catheters and femoral sheaths were removed with manual pressure used for hemostasis.   The patient tolerated the procedure well and is transported to the surgical intensive care in stable condition. There were no immediate intraoperative complications. All sponge instrument and needle counts are verified correct at completion of the operation.   The patient received a total of 56 mL of intravenous contrast during the procedure.   Sherren Mocha, MD 06/29/2018 3:17 PM

## 2018-06-29 NOTE — Interval H&P Note (Signed)
History and Physical Interval Note:  06/29/2018 8:05 AM  Tanya [name]  has presented today for surgery, with the diagnosis of heart failure.  The various methods of treatment have been discussed with the patient and family. After consideration of risks, benefits and other options for treatment, the patient has consented to  Procedure(s): RIGHT/LEFT HEART CATH AND CORONARY ANGIOGRAPHY (N/A) as a surgical intervention.  The patient's history has been reviewed, patient examined, no change in status, stable for surgery.  I have reviewed the patient's chart and labs.  Questions were answered to the patient's satisfaction.    Pt doing very well this am. No arrhythmia overnight on IV amiodarone. Spoke with the patient and her daughter at length this am. All questions answered. Plan TAVR via femoral access today.  Sherren Mocha

## 2018-06-29 NOTE — Progress Notes (Signed)
  Echocardiogram 2D Echocardiogram has been performed.  Tanya [name] 06/29/2018, 12:23 PM

## 2018-06-29 NOTE — Op Note (Signed)
[facility] VASC[facility]ENTER   MULTIDISCIPLINARY HEART VALVE TEAM   TAVR OPERATIVE NOTE   Date of Procedure:  06/29/2018  Preoperative Diagnosis: Severe Aortic Stenosis   Postoperative Diagnosis: Same   Procedure:    Transcatheter Aortic Valve Replacement - Percutaneous Right Transfemoral Approach  Edwards Sapien 3 THV (size 23 mm, model # 9600TFX, serial # [id number])   Co-Surgeons:  Valentina Gu. Roxy Manns, MD and Sherren Mocha, MD  Anesthesiologist:  Belinda Block, MD  Echocardiographer:  Ena Dawley, MD  Pre-operative Echo Findings:  Severe aortic stenosis  Moderate mitral stenosis  Normal left ventricular systolic function  Post-operative Echo Findings:  No paravalvular leak  Normal left ventricular systolic function   BRIEF CLINICAL NOTE AND INDICATIONS FOR SURGERY  Patient is 73 year old morbidly obese female with history of aortic stenosis, hypertension, recurrent paroxysmal atrial fibrillation on Eliquis anticoagulation, type 2 diabetes mellitus, hyperlipidemia, breast cancer status post left radical mastectomy and chemotherapy in the past, ovarian cancer status post chemotherapy and radical hysterectomy in 2010, chronic left arm lymphedema, obstructive sleep apnea with COPD on home oxygen therapy, left bundle branch block, and limited physical mobility with history of multiple previous orthopedic surgical procedures who was admitted to the hospital June 22, 2018 with acute respiratory failure.  She ruled out for COVID 19 infection and her clinical course has been notably consistent with acute exacerbation of chronic combined systolic and diastolic congestive heart failure, New York Heart Association functional class IV.  She initially required mechanical ventilation but she improved rapidly with diuretic therapy, was extubated from the ventilator, and has made excellent progress.  She continues to be treated with intravenous antibiotics although she has not had fevers,  leukocytosis, nor radiographic findings suggestive of pneumonia.  Transthoracic echocardiogram revealed severe aortic stenosis with mild global left ventricular systolic dysfunction.  There was moderate mitral annular calcification and mild to moderate mitral stenosis with moderate mitral regurgitation.  Diagnostic cardiac catheterization was notable for the absence of significant coronary artery disease and right heart pressures were only mildly elevated.  Cardiothoracic surgical consultation was requested.  During the course of the patient's preoperative work up they have been evaluated comprehensively by a multidisciplinary team of specialists coordinated through the [facility] in the [place] [facility].  They have been demonstrated to suffer from symptomatic severe aortic stenosis as noted above. The patient has been counseled extensively as to the relative risks and benefits of all options for the treatment of severe aortic stenosis including long term medical therapy, conventional surgery for aortic valve replacement, and transcatheter aortic valve replacement.  All questions have been answered, and the patient provides full informed consent for the operation as described.   DETAILS OF THE OPERATIVE PROCEDURE  PREPARATION:    The patient is brought to the operating room on the above mentioned date and central monitoring was established by the anesthesia team including placement of a central venous line and radial arterial line. The patient is placed in the supine position on the operating table.  Intravenous antibiotics are administered. The patient is monitored closely throughout the procedure under conscious sedation.  Baseline transthoracic echocardiogram was performed. The patient's chest, abdomen, both groins, and both lower extremities are prepared and draped in a sterile manner. A time out procedure is performed.   PERIPHERAL ACCESS:    Using  the modified Seldinger technique, femoral arterial and venous access was obtained with placement of 6 Fr sheaths on the left side.  A pigtail  diagnostic catheter was passed through the left arterial sheath under fluoroscopic guidance into the aortic root.  A temporary transvenous pacemaker catheter was passed through the left femoral venous sheath under fluoroscopic guidance into the right ventricle.  The pacemaker was tested to ensure stable lead placement and pacemaker capture. Aortic root angiography was performed in order to determine the optimal angiographic angle for valve deployment.   TRANSFEMORAL ACCESS:   Percutaneous transfemoral access and sheath placement was performed by Dr. Burt Knack using ultrasound guidance.  The right common femoral artery was cannulated using a micropuncture needle and appropriate location was verified using hand injection angiogram.  A pair of Abbott Perclose percutaneous closure devices were placed and a 6 French sheath replaced into the femoral artery.  The patient was heparinized systemically and ACT verified > 250 seconds.    A 14 Fr transfemoral E-sheath was introduced into the right common femoral artery after progressively dilating over an Amplatz superstiff wire. An AL-1 catheter was used to direct a straight-tip exchange length wire across the native aortic valve into the left ventricle. This was exchanged out for a pigtail catheter and position was confirmed in the LV apex. Simultaneous LV and Ao pressures were recorded.  The pigtail catheter was exchanged for an Amplatz Extra-stiff wire in the LV apex.  Echocardiography was utilized to confirm appropriate wire position and no sign of entanglement in the mitral subvalvular apparatus.   TRANSCATHETER HEART VALVE DEPLOYMENT:   An Edwards Sapien 3 transcatheter heart valve (size 23 mm, model #9600TFX, serial #[id number]) was prepared and crimped per manufacturer's guidelines, and the proper orientation of the valve  is confirmed on the [employer] delivery system. The valve was advanced through the introducer sheath using normal technique until in an appropriate position in the abdominal aorta beyond the sheath tip. The balloon was then retracted and using the fine-tuning wheel was centered on the valve. The valve was then advanced across the aortic arch using appropriate flexion of the catheter. The valve was carefully positioned across the aortic valve annulus. The Commander catheter was retracted using normal technique. Once final position of the valve has been confirmed by angiographic assessment, the valve is deployed while temporarily holding ventilation and during rapid ventricular pacing to maintain systolic blood pressure < 50 mmHg and pulse pressure < 10 mmHg. The balloon inflation is held for >3 seconds after reaching full deployment volume. Once the balloon has fully deflated the balloon is retracted into the ascending aorta and valve function is assessed using echocardiography. There is felt to be no paravalvular leak and no central aortic insufficiency.  The patient's hemodynamic recovery following valve deployment is good.  The deployment balloon and guidewire are both removed.    PROCEDURE COMPLETION:   The sheath was removed and femoral artery closure performed by Dr Burt Knack.  Protamine was administered once femoral arterial repair was complete. The temporary pacemaker, pigtail catheters and femoral sheaths were removed with manual pressure used for hemostasis.   The patient tolerated the procedure well and is transported to the surgical intensive care in stable condition. There were no immediate intraoperative complications. All sponge instrument and needle counts are verified correct at completion of the operation.   No blood products were administered during the operation.  The patient received a total of 56 mL of intravenous contrast during the procedure.   Rexene Alberts,  MD 06/29/2018 12:21 PM

## 2018-06-29 NOTE — Progress Notes (Signed)
Patient was down in OR since this morning and hasn't returned to her room yet. Per discussion with cardiology PA, Curt Bears on 06/28/18, cardiology to take over as attending after TVAR. [place] signing off but available if needed.

## 2018-06-29 NOTE — Progress Notes (Signed)
Pt arrived back from TAVR. Vitals obtained. HR stable with rate of 66. Bilateral groins level 0. Bedrest until 1610. Pt updating her daughter over the phone. Amio drip infusing, per order. Call light within reach. Purewick in place. Will continue to monitor.

## 2018-06-29 NOTE — Discharge Instructions (Signed)
Local Endocrinologists [facility] [phone]) 1. Dr. Philemon Kingdom 2. Dr. Janie [facility] [phone]) 1. Dr. Delrae Rend [facility] [phone]) 1. Dr. Jacelyn Pi 2. Dr. Anda Kraft [facility] [phone]) 1. Dr. Daneil Dolin [facility] [phone]) [[place] office]  [phone]) [[place] office] 1. Dr. Lenna Sciara [place] 2. Dr. Judithann Sheen [facility]) [phone]) 1. Autumn Hudnall Ronnald Ramp), PA 2. Dr. Amalia Greenhouse 3. Dr. Marsh Dolly. [facility] [phone]) 1. Dr. Glade Lloyd [facility]  [phone]) 1. Dr. Orville Govern 2. Dr. Lelon Huh 3. Dr. Jerelene Redden 4. Alwyn Ren, FNP Dr. Carolynn Serve. [facility] [place] [phone])  ACTIVITY AND EXERCISE  Daily activity and exercise are an important part of your recovery. People recover at different rates depending on their general health and type of valve procedure.  Most people recovering from TAVR feel better relatively quickly   No lifting, pushing, pulling more than 10 pounds (examples to avoid: groceries, vacuuming, gardening, golfing):             - For one week with a procedure through the groin.             - For six weeks for procedures through the chest wall or neck NOTE: You will typically see one of our providers 7-14 days after your procedure to discuss [place] the above activities.      DRIVING  Do not drive for until you are seen for follow up and cleared by a provider. Generally, we ask patient to not drive for 1 week after their procedure.  If you have been told by your doctor in the past that you may not drive, you must talk with him/her before you begin driving again.     DRESSING  Groin site: you may leave the clear dressing over the site for up to one week or until it falls off.     HYGIENE  If you had a femoral (leg) procedure, you may take a shower when you return home. After the shower, pat the site dry. Do NOT use powder, oils or lotions in your groin area until the site has completely healed.  If you had a chest procedure, you may shower when you return home unless specifically instructed not to by your discharging practitioner.             - DO NOT scrub incision; pat dry with a towel             - DO NOT apply any lotions, oils, powders to the incision             - No tub baths / swimming for at least 2 weeks.  If you notice any fevers, chills, increased pain, swelling, bleeding or pus, please contact your doctor.   ADDITIONAL INFORMATION  If you are going to have an upcoming dental procedure, please contact our office as you will require antibiotics ahead of time to prevent infection on your heart valve.    If you have any questions or concerns you can call the structural heart phone during normal business hours 8am-4pm. If you have an urgent need after hours or weekends please call [phone] to talk to the on call provider for general cardiology. If you have an emergency that requires immediate attention, please call 911.    After TAVR Checklist  Check  Test Description   Follow up appointment in 1-2 weeks  You will see our structural heart physician assistant, Nell Range. Your incision sites will be checked and you will be cleared to drive and resume all normal activities if you are doing well.     1 month echo and follow up  You will have an echo to check on your new heart valve and be seen back in the office by Nell Range. Many times the echo is not read by your appointment time, but Joellen Jersey will call you later that day or the following day to report your results.   Follow up with your primary cardiologist You will need to be seen by your primary cardiologist in the following 3-6 months after your 1 month appointment in the valve clinic. Often  times your Plavix or Aspirin will be discontinued during this time, but this is decided on a case by case basis.    1 year echo and follow up You will have another echo to check on your heart valve after 1 year and be seen back in the office by Nell Range. This your last structural heart visit.   Bacterial endocarditis prophylaxis  You will have to take antibiotics for the rest of your life before all dental procedures (even teeth cleanings) to protect your heart valve. Antibiotics are also required before some surgeries. Please check with your cardiologist before scheduling any surgeries. Also, please make sure to tell us if you have a penicillin allergy as you will require an alternative antibiotic.       HEART AND VASCULAR CENTER   MULTIDISCIPLINARY HEART VALVE TEAM   YOUR CARDIOLOGY TEAM HAS ARRANGED FOR AN E-VISIT FOR YOUR APPOINTMENT - PLEASE REVIEW IMPORTANT INFORMATION BELOW SEVERAL DAYS PRIOR TO YOUR APPOINTMENT  Due to the recent COVID-19 pandemic, we are transitioning in-person office visits to tele-medicine visits in an effort to decrease unnecessary exposure to our patients, their families, and staff. These visits are billed to your insurance just like a normal visit is. We also encourage you to sign up for MyChart if you have not already done so. You will need a smartphone if possible. For patients that do not have this, we can still complete the visit using a regular telephone but do prefer a smartphone to enable video when possible. You may have a family member that lives with you that can help. If possible, we also ask that you have a blood pressure cuff and scale at home to measure your blood pressure, heart rate and weight prior to your scheduled appointment. Patients with clinical needs that need an in-person evaluation and testing will still be able to come to the office if absolutely necessary. If you have any questions, feel free to call our office.    YOUR PROVIDER WILL  BE USING THE FOLLOWING PLATFORM TO COMPLETE YOUR VISIT: [email or web address] All you need is a smart phone. You will receive a text message from the provider through the [email or web address] app. You will follow the prompts and it will take you to a virtual visit with your provider. Please check your blood pressure, heart rate and weight prior to your scheduled appointment.  CONSENT FOR TELE-HEALTH VISIT - PLEASE REVIEW  I hereby voluntarily request, consent and authorize [place] and its employed or contracted physicians, physician assistants, nurse practitioners or other licensed health care professionals (the Practitioner), to provide me with telemedicine health care services (the Services") as deemed necessary by the treating Practitioner. I acknowledge and consent to receive the Services by the Practitioner via telemedicine. I understand  that the telemedicine visit will involve communicating with the Practitioner through live audiovisual communication technology and the disclosure of certain medical information by electronic transmission. I acknowledge that I have been given the opportunity to request an in-person assessment or other available alternative prior to the telemedicine visit and am voluntarily participating in the telemedicine visit.  I understand that I have the right to withhold or withdraw my consent to the use of telemedicine in the course of my care at any time, without affecting my right to future care or treatment, and that the Practitioner or I may terminate the telemedicine visit at any time. I understand that I have the right to inspect all information obtained and/or recorded in the course of the telemedicine visit and may receive copies of available information for a reasonable fee.  I understand that some of the potential risks of receiving the Services via telemedicine include:   Delay or interruption in medical evaluation due to technological equipment failure or disruption;  Information  transmitted may not be sufficient (e.g. poor resolution of images) to allow for appropriate medical decision making by the Practitioner; and/or   In rare instances, security protocols could fail, causing a breach of personal health information.  Furthermore, I acknowledge that it is my responsibility to provide information about my medical history, conditions and care that is complete and accurate to the best of my ability. I acknowledge that Practitioner's advice, recommendations, and/or decision may be based on factors not within their control, such as incomplete or inaccurate data provided by me or distortions of diagnostic images or specimens that may result from electronic transmissions. I understand that the practice of medicine is not an exact science and that Practitioner makes no warranties or guarantees regarding treatment outcomes. I acknowledge that I will receive a copy of this consent concurrently upon execution via email to the email address I last provided but may also request a printed copy by calling the office of [place].    I understand that my insurance will be billed for this visit.   I have read or had this consent read to me.  I understand the contents of this consent, which adequately explains the benefits and risks of the Services being provided via telemedicine.   I have been provided ample opportunity to ask questions regarding this consent and the Services and have had my questions answered to my satisfaction.  I give my informed consent for the services to be provided through the use of telemedicine in my medical care  By participating in this telemedicine visit I agree to the above.

## 2018-06-29 NOTE — Progress Notes (Signed)
  [place] VALVE TEAM  Patient doing well s/p TAVR. Her BP is soft and still on 10 mcg of neo. Plan to wean this as tolerated. Groin sites stable. Still on IV amiodarone. ECG with old LBBB and no high grade block. When stable off pressors will dc art line and transfer  to 4E. Plan for early ambulation after bedrest completed and hopeful discharge over the next 24-48 hours.   Angelena Form PA-C  [facility]  Pager [phone]

## 2018-06-30 ENCOUNTER — Other Ambulatory Visit (HOSPITAL_COMMUNITY): Payer: Medicare Other

## 2018-06-30 ENCOUNTER — Inpatient Hospital Stay (HOSPITAL_COMMUNITY): Payer: Medicare Other

## 2018-06-30 ENCOUNTER — Encounter (HOSPITAL_COMMUNITY): Payer: Self-pay | Admitting: Cardiovascular Disease

## 2018-06-30 DIAGNOSIS — I361 Nonrheumatic tricuspid (valve) insufficiency: Secondary | ICD-10-CM

## 2018-06-30 DIAGNOSIS — Z952 Presence of prosthetic heart valve: Secondary | ICD-10-CM

## 2018-06-30 LAB — CBC
HCT: 32.8 % — ABNORMAL LOW (ref 36.0–46.0)
Hemoglobin: 10.5 g/dL — ABNORMAL LOW (ref 12.0–15.0)
MCH: 31.2 pg (ref 26.0–34.0)
MCHC: 32 g/dL (ref 30.0–36.0)
MCV: 97.3 fL (ref 80.0–100.0)
Platelets: 249 10*3/uL (ref 150–400)
RBC: 3.37 MIL/uL — ABNORMAL LOW (ref 3.87–5.11)
RDW: 14.4 % (ref 11.5–15.5)
WBC: 8.1 10*3/uL (ref 4.0–10.5)
nRBC: 0 % (ref 0.0–0.2)

## 2018-06-30 LAB — BASIC METABOLIC PANEL
Anion gap: 10 (ref 5–15)
BUN: 7 mg/dL — ABNORMAL LOW (ref 8–23)
CO2: 26 mmol/L (ref 22–32)
Calcium: 8.5 mg/dL — ABNORMAL LOW (ref 8.9–10.3)
Chloride: 103 mmol/L (ref 98–111)
Creatinine, Ser: 0.63 mg/dL (ref 0.44–1.00)
GFR calc Af Amer: >60 mL/min (ref >60)
GFR calc non Af Amer: >60 mL/min (ref >60)
Glucose, Bld: 172 mg/dL — ABNORMAL HIGH (ref 70–99)
Potassium: 4.1 mmol/L (ref 3.5–5.1)
Sodium: 139 mmol/L (ref 135–145)

## 2018-06-30 LAB — ECHOCARDIOGRAM COMPLETE
Height: 56 in
Weight: 3104 oz

## 2018-06-30 LAB — GLUCOSE, CAPILLARY
Glucose-Capillary: 167 mg/dL — ABNORMAL HIGH (ref 70–99)
Glucose-Capillary: 188 mg/dL — ABNORMAL HIGH (ref 70–99)

## 2018-06-30 LAB — MAGNESIUM: Magnesium: 1.3 mg/dL — ABNORMAL LOW (ref 1.7–2.4)

## 2018-06-30 MED ORDER — ASPIRIN 81 MG PO CHEW: 81.0000 mg | CHEWABLE_TABLET | Freq: Every day | ORAL | Status: DC

## 2018-06-30 MED ORDER — FUROSEMIDE 40 MG PO TABS: 40.0000 mg | ORAL_TABLET | Freq: Every day | ORAL | 1 refills | Status: DC

## 2018-06-30 MED ORDER — AMIODARONE HCL 200 MG PO TABS: 200.0000 mg | ORAL_TABLET | Freq: Every day | ORAL | 0 refills | Status: DC

## 2018-06-30 MED ORDER — POTASSIUM CHLORIDE ER 10 MEQ PO TBCR: 10.0000 meq | EXTENDED_RELEASE_TABLET | Freq: Every day | ORAL | 1 refills | Status: DC

## 2018-06-30 NOTE — Progress Notes (Signed)
Central line removed per MD order. Tip is intact, no hematoma or bleeding at the site. Pt tolerated well. Pressure dressing applied. Amiodarone drip moved to PIV, RN notified

## 2018-06-30 NOTE — Discharge Summary (Addendum)
[facility] VALVE TEAM  Discharge Summary    Patient ID: Tanya [name] MRN: [id number]; DOB: 1945-11-03  Admit date: 06/22/2018 Discharge date: 06/30/2018  Primary Care Provider: System, Pcp Not In  Primary Cardiologist: Sanda Klein, MD / Dr. Burt Knack & Dr. Roxy Manns ( TAVR)  Discharge Diagnoses    Principal Problem:   S/P TAVR (transcatheter aortic valve replacement) Active Problems:   Acute hypercapnic respiratory failure (HCC)   DKA (diabetic ketoacidoses) (HCC)   Elevated brain natriuretic peptide (BNP) level   Neutrophilic leukocytosis   Urine incontinence   Severe aortic stenosis   PAF (paroxysmal atrial fibrillation) (HCC)   OSA on CPAP   Neuropathy   HTN (hypertension)   Diabetes ([place])   Asthma   Lymphedema of left arm   Morbid obesity ([place])   Acute on chronic respiratory failure with hypoxia and hypercapnia (HCC)   Demand ischemia (HCC)   Moderate mitral stenosis   Acute on chronic combined systolic and diastolic CHF (congestive heart failure) (HCC)   Moderate pulmonary arterial systolic hypertension (HCC)   Pulmonary nodules   Allergies Allergies  Allergen Reactions   Pneumovax [Pneumococcal Polysaccharide Vaccine] Swelling    Arm swelling   Sulfa Antibiotics Other (See Comments)    UNSPECIFIED CHILDHOOD REACTION   Levaquin [Levofloxacin] Rash    Diagnostic Studies/Procedures    Cardiac catheterization 06/25/2018:  Left dominant coronary anatomy with normal coronary arteries.  Severe aortic stenosis.  Moderate mitral stenosis.  Normal pulmonary artery pressures  Normal filling pressures.LVEDP 13 mmHg and pulmonary capillary wedge mean 15 mmHg.  Severe mitral annular calcification and to a lesser extent aortic valve calcification on cine fluoroscopy.  RECOMMENDATIONS:   Discussed with Dr. Sherren Mocha.  Consideration is being given to TAVR.  _____________   Echocardiogram 06/23/2018: 1.  The left ventricle has mild-moderately reduced systolic function, with an ejection fraction of 40-45%. The cavity size was normal. Left ventricular diastolic function could not be evaluated. Left ventricular diffuse hypokinesis. 2. The right ventricle has normal systolic function. The cavity was normal. There is no increase in right ventricular wall thickness. Right ventricular systolic pressure is moderately elevated with an estimated pressure of 64.6 mmHg. 3. Left atrial size was moderately dilated. 4. Right atrial size was moderately dilated. 5. The mitral valve is degenerative. Mild thickening of the mitral valve leaflet. There is moderate mitral annular calcification present. Mitral valve regurgitation is moderate by color flow Doppler. Moderate mitral valve stenosis. 6. The aortic valve is tricuspid. Severely thickening of the aortic valve. Severe calcifcation of the aortic valve. Aortic valve regurgitation is moderate by color flow Doppler. Severe stenosis of the aortic valve. 7. Pulmonary hypertension is moderate.  _____________   Cardiac CT 06/26/18 IMPRESSION: 1. Trileaflet aortic valve with only moderately thickened and mildly calcified leaflets and minimal calcifications extending into the LVOT. Leaflet opening is severely restricted. Annular measurements suitable for delivery of a 23 mm Edwards-SAPIEN 3 valve (lower end for measurements).  2. Sufficient coronary to annulus distance.  3. Optimum Fluoroscopic Angle for Delivery: LAO 1 CAU 0  4. No thrombus in a large left atrial appendage.  5. Severe mitral annular calcifications.  6. Severely dilated pulmonary artery measuring 40 mm consistent with pulmonary hypertension.  _____________  CTA chest/abd/pelvis 06/26/18 IMPRESSION: 1. Vascular findings and measurements pertinent to potential TAVR procedure, as detailed above. 2. Severe thickening calcification of the aortic valve, compatible with the  reported clinical history of severe aortic stenosis. There  is also severe calcifications of the mitral valve and mitral annulus. 3. Dilatation of the pulmonic trunk (3.6 cm in diameter), concerning for pulmonary arterial hypertension. 4. Small left and small to moderate right pleural effusions lying dependently with some associated passive subsegmental atelectasis in the lower lobes of the lungs bilaterally. 5. Multiple tiny 2-3 mm pulmonary nodules scattered throughout the periphery of the lungs bilaterally, nonspecific, but strongly favored to represent benign areas of mucoid impaction within terminal bronchioles. No follow-up needed if patient is low-risk (and has no known or suspected primary neoplasm). Non-contrast chest CT can be considered in 12 months if patient is high-risk. This recommendation follows the consensus statement: Guidelines for Management of Incidental Pulmonary Nodules Detected on CT Images: From the Fleischner Society 2017; Radiology 2017; 284:228-243. 6. Colonic diverticulosis without evidence of acute diverticulitis at this time. 7. Small epigastric ventral hernia, containing a short segment of mid small bowel, without evidence of bowel incarceration or obstruction at this time. 8. Additional incidental findings, as above.   _____________   TAVR OPERATIVE NOTE   Date of Procedure:                06/29/2018  Preoperative Diagnosis:      Severe Aortic Stenosis   Postoperative Diagnosis:    Same   Procedure:        Transcatheter Aortic Valve Replacement - Percutaneous Right Transfemoral Approach             Edwards Sapien 3 THV (size 23 mm, model # 9600TFX, serial # [id number])              Co-Surgeons:                        Valentina Gu. Roxy Manns, MD and Sherren Mocha, MD  Anesthesiologist:                  Belinda Block, MD  Echocardiographer:              Ena Dawley, MD  Pre-operative Echo Findings: ? Severe aortic  stenosis ? Moderate mitral stenosis ? Normal left ventricular systolic function  Post-operative Echo Findings: ? No paravalvular leak ? Normal left ventricular systolic function  _____________    Echo 06/30/18: completed but pending formal read at the time of discharge   History of Present Illness     Tanya Landgren Redusis a 73 y.o.femalewith a hx of HTN, DMT2, HLD,morbid obesity,PAF on eliquis, breast cancer s/p chemo and left radical mastectomy(1990s), ovarian cancer s/p chemo s/p radical hysterectomy (2010),left arm lymphedema, COPD on 2L 02, LBBB, OSA on CPAP, history of septic arthritis on chronic doxycycline and aortic stenosis who presented to [facility] on 06/22/18 with acute on chronichypoxic and hypercapnicrespiratory failure in the setting of acute CHF 2/2 severe aortic stenosis and moderate mitral stenosis/regurgitation.  Ms. Capri has lived independently in an apartment in [place], [place]. Due to recent health decline she has moved in with her daughter who lives in [place], [place]. She has a son that lives in [place]. She no longer drives due to left arm issues related to a previous shoulder injury. She can walk without a walker or cane and otherwise functions independently. She has full dentures. She has a 30 pack year smoking history but has never been formally diagnosed with COPD. She has been wearing 2L of home oxygen over the past year or so.   She has a history of breast cancer s/p chemo and left  radical mastectomy in 1994 and ovarian cancer s/p chemo s/p radical hysterectomy in 2010. She has left arm lymphedema since her mastectomy.   She has had bilateral total knee replacements in 2000 and 2004 and total reverse shoulder replacement in 2017. After her shoulder replacement she developed a right shoulder DVT and septic arthritis in her shoulder and knee requiring debridement and IV abx. She was also placed Coumadin. She had recurrence of septic arthritis and was placed on  prophylactic doxycycline. Since her admission for septic arthritis she has been on home 02.   She has been followed by Dr. Lamar Blinks with cardiology in [place] but recently switched care to Dr. Linford Arnold. She has been followed over time for HTN, PAF on Eliquis, and aortic stenosis. Over the past 6 months, she has had a progressive decline in her functional status. Two weeks prior to admission she developed dyspnea on exertion with only a few steps, hypoxia, chest tightness and dizziness with exertion. She also had LE edema.   She presented to [facility] ED on 4/14 with 10/10 chest pain and acute respiratory failure. The patient was intubated in the ER. Initial labs revealed WBC 18 without lymphopenia. LDH 241. D-Dimer 0.87. BNP 447. Troponin-I 0.21. Glucose 429. CXR revealed bilatopacities with pulmonary edema with R effusion.Cardiology was consulted and Dr. Sallyanne Kuster performed a bedside echo which showed EF 50%, heavily calcified aortic valve and heavy MAC. Complete echo was recommended when ruled out for Covid 19. She was started on an insulin gtt, IV lasix and empiric antibiotics.    Hospital Course     Consultants: hospitalists, CTCS, cardiology  Acute on chronichypoxic and hypercapnicrespiratory failure: she ruled out for Covid-19. She improved quickly with IV diuresis. She was able to be extubated the following day and placed on Bipap and then transitioned to HFNC. She will stay on her chronic home 02.   Acute on chronic combined S/D CHF: EF 40-45%, net neg negative 4.7L. Weight down 199--> 194 lbs. Plan to discharge her on lasix 39m daily and Kdur 10 Meq daily.  Severe AS: found to have severe AS. Pre TAVR L/RHC showed widely patent coronary arteries. She is now s/p successful TAVR with a 23 mm Edwards Sapien 3 THV via the TF approach on 06/29/18. Post operative echo completed but pending formal read. Groin sites are stable. ECG with old LBBB and no high grade heart block. Continue  Asprin and home Eliquis, which will be resumed tonight.   PAF: had runs of afib with RVRduring admission. Required oral metoprolol with the addition of IV amio bolus with drip and IV heparin. Plan to convert her to oral amiodarone 2031mdaily x1 month then discontinue. Resume home Toprol XL 2529maily.Resume home Eliquis tonight  Mitral valve disease: she is not a surgical candidate. Plan to treat medically.   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   HTN: BP has been soft but better today. Resume home Toprol XL. Stop PTA imdur.   DMT2: presented with DKA requiring an insulin htt. Treated with SSI here. Resume home regimen at discharge, but hold Metformin x 48 hours after contrast dye exposure. Okay to resume 4/23 PM  Pulmonary nodules: noted on pre TAVR CT scan. With 30 year pack history of smoking, she will need follow up CT scan in 12 months. I will follow this as an outpatient.   _____________  Discharge Vitals Blood pressure (!) 131/41, pulse 76, temperature 98.6 F (37 C), temperature source Oral, resp. rate (!)Marland Kitchen  21, height _0  (1.422 m), weight 88 kg, SpO2 99 %.  Filed Weights   06/28/18 0807 06/29/18 0518 06/30/18 0421  Weight: 86.9 kg 86.4 kg 88 kg   VS:  BP (!) 131/41 (BP Location: Right Arm)    Pulse 76    Temp 98.6 F (37 C) (Oral)    Resp (!) 21    Ht _1  (1.422 m)    Wt 88 kg    SpO2 99%    BMI 43.49 kg/m    GEN: Well nourished, well developed, in no acute distress, obese laying in bed getting echo HEENT: normal Neck: no JVD or masses Cardiac: RRR; soft flow murmur. No rubs, or gallops,no edema  Respiratory:  clear to auscultation bilaterally, normal work of breathing GI: soft, nontender, nondistended, + BS MS: no deformity or atrophy Skin: warm and dry, no rash. Groin sites soft with no hematoma or ecchymosis Neuro:  Alert and Oriented x 3, Strength and sensation are intact Psych: euthymic mood, full affect  Labs &  Radiologic Studies    CBC Recent Labs    06/29/18 0335  06/29/18 1301 06/30/18 0417  WBC 9.4  --   --  8.1  HGB 12.6   < > 10.9* 10.5*  HCT 38.2   < > 32.0* 32.8*  MCV 94.6  --   --  97.3  PLT 311  --   --  249   < > = values in this interval not displayed.   Basic Metabolic Panel Recent Labs    06/29/18 0335 06/29/18 1043 06/29/18 1301 06/29/18 1307 06/30/18 0417  NA 139 138 138  --  139  K 4.3 4.4 4.6  --  4.1  CL 101  --   --   --  103  CO2 25  --   --   --  26  GLUCOSE 161* 179*  --   --  172*  BUN 16  --   --   --  7*  CREATININE 0.71  --   --  0.60 0.63  CALCIUM 9.5  --   --   --  8.5*  MG 1.6*  --   --   --  1.3*   Liver Function Tests No results for input(s): AST, ALT, ALKPHOS, BILITOT, PROT, ALBUMIN in the last 72 hours. No results for input(s): LIPASE, AMYLASE in the last 72 hours. Cardiac Enzymes No results for input(s): CKTOTAL, CKMB, CKMBINDEX, TROPONINI in the last 72 hours. BNP Invalid input(s): POCBNP D-Dimer No results for input(s): DDIMER in the last 72 hours. Hemoglobin A1C No results for input(s): HGBA1C in the last 72 hours. Fasting Lipid Panel No results for input(s): CHOL, HDL, LDLCALC, TRIG, CHOLHDL, LDLDIRECT in the last 72 hours. Thyroid Function Tests Recent Labs    06/28/18 0842  TSH 3.436   _____________  Dg Chest 1 View  Result Date: 06/23/2018 CLINICAL DATA:  Shortness of breath EXAM: CHEST  1 VIEW COMPARISON:  06/23/2018 FINDINGS: Bilateral interstitial and alveolar airspace opacities with more focal bilateral lower lobe airspace disease. Small bilateral pleural effusions. No pneumothorax. Stable cardiomegaly. Right jugular central venous catheter with the tip projecting over the SVC. No acute osseous abnormality. IMPRESSION: Bilateral lower lobe airspace disease concerning for pneumonia. Bilateral interstitial and alveolar airspace opacities with cardiomegaly likely reflecting pulmonary edema and/or associated multilobar  pneumonia. Electronically Signed   By: Kathreen Devoid   On: 06/23/2018 15:59   Ct Coronary Morph W/cta Cor Nancy Fetter W/ca W/cm &/or  Wo/cm  Addendum Date: 06/28/2018   ADDENDUM REPORT: 06/28/2018 08:04 CLINICAL DATA:  73 year old female with severe aortic stenosis being evaluated for a TAVR procedure. EXAM: Cardiac TAVR CT TECHNIQUE: The patient was scanned on a [employer]. A 120 kV retrospective scan was triggered in the descending thoracic aorta at 111 HU's. Gantry rotation speed was 250 msecs and collimation was .6 mm. No beta blockade or nitro were given. The 3D data set was reconstructed in 5% intervals of the R-R cycle. Systolic and diastolic phases were analyzed on a dedicated work station using MPR, MIP and VRT modes. The patient received 80 cc of contrast. FINDINGS: Aortic Valve: Trileaflet aortic valve with only moderately thickened and mildly calcified leaflets and minimal calcifications extending into the LVOT. Leaflet opening is severely restricted. Aorta: Normal size, mild diffuse atherosclerotic plaque and calcifications and no dissection. Sinotubular Junction: 26 x 25 mm Ascending Thoracic Aorta: 31 x 31 mm Aortic Arch: 27 x 25 mm Descending Thoracic Aorta: 25 x 23 mm Sinus of Valsalva Measurements: Non-coronary: 31 mm Right -coronary: 28 mm Left -coronary: 33 mm Coronary Artery Height above Annulus: Left Main: 8.2 mm Right Coronary: 11 mm Virtual Basal Annulus Measurements: Maximum/Minimum Diameter: 24.2 x 18.0 mm Mean Diameter: 20.6 mm Perimeter: 66.2 mm Area: 332 mm2 Optimum Fluoroscopic Angle for Delivery: LAO 1 CAU 0 IMPRESSION: 1. Trileaflet aortic valve with only moderately thickened and mildly calcified leaflets and minimal calcifications extending into the LVOT. Leaflet opening is severely restricted. Annular measurements suitable for delivery of a 23 mm Edwards-SAPIEN 3 valve (lower end for measurements). 2. Sufficient coronary to annulus distance. 3. Optimum Fluoroscopic Angle  for Delivery:  LAO 1 CAU 0 4. No thrombus in a large left atrial appendage. 5. Severe mitral annular calcifications. 6. Severely dilated pulmonary artery measuring 40 mm consistent with pulmonary hypertension. Electronically Signed   By: Ena Dawley   On: 06/28/2018 08:04   Result Date: 06/28/2018 EXAM: OVER-READ INTERPRETATION  CT CHEST The following report is an over-read performed by radiologist Dr. Vinnie Langton of [facility], [place] on 06/28/2018. This over-read does not include interpretation of cardiac or coronary anatomy or pathology. The coronary calcium score/coronary CTA interpretation by the cardiologist is attached. COMPARISON:  None. FINDINGS: Extracardiac findings will be described separately under dictation for contemporaneously obtained CTA chest, abdomen and pelvis. IMPRESSION: Please see separate dictation for contemporaneously obtained CTA chest, abdomen and pelvis 06/26/2018 for full description of relevant extracardiac findings. Electronically Signed: By: Vinnie Langton M.D. On: 06/28/2018 07:40   Dg Chest Port 1 View  Result Date: 06/29/2018 CLINICAL DATA:  Status post TAVR. EXAM: PORTABLE CHEST 1 VIEW COMPARISON:  CT chest dated June 26, 2018. Chest x-ray dated June 24, 2018. FINDINGS: Unchanged right internal jugular central venous catheter with tip in the mid SVC. Interval TAVR. Stable borderline cardiomegaly. Atherosclerotic calcification of the aortic arch. Normal pulmonary vascularity. Resolved bilateral pleural effusions and bibasilar atelectasis. No consolidation or pneumothorax. No acute osseous abnormality. IMPRESSION: 1. Interval TAVR. Resolved bilateral pleural effusions and bibasilar atelectasis. Electronically Signed   By: Titus Dubin M.D.   On: 06/29/2018 16:15   Dg Chest Port 1 View  Result Date: 06/24/2018 CLINICAL DATA:  Acute respiratory failure EXAM: PORTABLE CHEST 1 VIEW COMPARISON:  06/23/2018 FINDINGS: Right jugular central venous catheter  is stable. Normal heart size. Bilateral airspace disease and bilateral pleural effusions have improved. No pneumothorax. Left shoulder total arthroplasty. IMPRESSION: Improved bilateral airspace disease and bilateral pleural effusions. Electronically Signed  By: Marybelle Killings M.D.   On: 06/24/2018 08:20   Dg Chest Port 1 View  Result Date: 06/23/2018 CLINICAL DATA:  Acute hypercapnic respiratory failure. Endotracheal tube present. EXAM: PORTABLE CHEST 1 VIEW COMPARISON:  06/22/2018 FINDINGS: Endotracheal tube, right jugular central venous catheter, and orogastric tube remain in appropriate position. Stable mild cardiomegaly. Stable diffuse interstitial infiltrates, suspicious for pulmonary edema. Decreased lung volumes are seen, with increased atelectasis or consolidation in left retrocardiac lung base. Probable small bilateral pleural effusions, without change. IMPRESSION: 1. Decreased lung volumes with increased atelectasis or consolidation in left retrocardiac lung base. 2. Stable cardiomegaly and diffuse interstitial infiltrates. Probable small bilateral pleural effusions. Electronically Signed   By: Earle Gell M.D.   On: 06/23/2018 07:41   Dg Chest Portable 1 View  Result Date: 06/22/2018 CLINICAL DATA:  Status post central line placement EXAM: PORTABLE CHEST 1 VIEW COMPARISON:  06/22/2018 FINDINGS: Cardiac shadow remains enlarged. Endotracheal tube and nasogastric catheter are again noted and stable. New right jugular central line is noted at the cavoatrial junction. The degree of vascular congestion and asymmetric edema has improved in the interval from the prior exam. Right-sided pleural effusion is noted. No pneumothorax is seen. IMPRESSION: Improved aeration in the right lung with decrease in the degree of edema. Right effusion is noted. Electronically Signed   By: Inez Catalina M.D.   On: 06/22/2018 23:35   Dg Chest Portable 1 View  Result Date: 06/22/2018 CLINICAL DATA:  Check endotracheal  tube placement EXAM: PORTABLE CHEST 1 VIEW COMPARISON:  06/22/2018 FINDINGS: Cardiac shadow is stable. Endotracheal tube and gastric catheter are noted in satisfactory position. Stable changes of vascular congestion and asymmetric edema are again noted. No new focal abnormality is seen. Left shoulder replacement is noted. IMPRESSION: Increased vascular congestion and asymmetric edema right greater than left. Tubes and lines as described. Electronically Signed   By: Inez Catalina M.D.   On: 06/22/2018 20:16   Dg Chest Port 1 View  Result Date: 06/22/2018 CLINICAL DATA:  Chest pain and shortness of breath. EXAM: PORTABLE CHEST 1 VIEW COMPARISON:  04/27/2003. FINDINGS: 1850 hours. The cardio pericardial silhouette is enlarged. Diffuse interstitial and bibasilar airspace disease evident, right greater than left. Tiny bilateral pleural effusions. Bones are diffusely demineralized. Patient is status post left shoulder replacement. Telemetry leads overlie the chest. IMPRESSION: Cardiomegaly with interstitial and asymmetric airspace disease, right greater than left. Tiny bilateral pleural effusions. Imaging features suggest asymmetric pulmonary edema although diffuse infection not excluded. Electronically Signed   By: Misty Stanley M.D.   On: 06/22/2018 19:24   Ct Angio Chest Aorta W/cm &/or Wo/cm  Result Date: 06/28/2018 CLINICAL DATA:  73 year old female with history of severe aortic stenosis. Preprocedural study prior to potential transcatheter aortic valve replacement (TAVR) procedure. EXAM: CT ANGIOGRAPHY CHEST, ABDOMEN AND PELVIS TECHNIQUE: Multidetector CT imaging through the chest, abdomen and pelvis was performed using the standard protocol during bolus administration of intravenous contrast. Multiplanar reconstructed images and MIPs were obtained and reviewed to evaluate the vascular anatomy. CONTRAST:  171m OMNIPAQUE IOHEXOL 350 MG/ML SOLN COMPARISON:  No priors. FINDINGS: CTA CHEST FINDINGS  Cardiovascular: Heart size is borderline enlarged. There is no significant pericardial fluid, thickening or pericardial calcification. There is aortic atherosclerosis, as well as atherosclerosis of the great vessels of the mediastinum and the coronary arteries, including calcified atherosclerotic plaque in the left main and left anterior descending coronary arteries. Thickening and calcification of the aortic valve. There is also severe thickening of the  mitral valve and mitral annulus. Dilatation of the pulmonic trunk (3.6 cm in diameter). Mediastinum/Lymph Nodes: No pathologically enlarged mediastinal or hilar lymph nodes. Esophagus is unremarkable in appearance. No axillary lymphadenopathy. Lungs/Pleura: Several tiny calcified granulomas are scattered throughout both lungs. Other tiny 2-3 mm pulmonary nodules are also noted in the periphery of the lungs, nonspecific, but statistically likely benign areas of mucoid impaction. No larger more suspicious appearing pulmonary nodules or masses are noted. No acute consolidative airspace disease. Small left and small to moderate right pleural effusions lying dependently with some associated passive subsegmental atelectasis in the dependent portions of the lower lobes of the lungs bilaterally. Musculoskeletal/Soft Tissues: Status post left shoulder arthroplasty. There are no aggressive appearing lytic or blastic lesions noted in the visualized portions of the skeleton. Status post left modified radical mastectomy. CTA ABDOMEN AND PELVIS FINDINGS Hepatobiliary: No suspicious cystic or solid hepatic lesions. No intra or extrahepatic biliary ductal dilatation. Gallbladder is normal in appearance. Pancreas: No pancreatic mass. No pancreatic ductal dilatation. No pancreatic or peripancreatic fluid or inflammatory changes. Spleen: Unremarkable. Adrenals/Urinary Tract: Subcentimeter low-attenuation lesions in the kidneys bilaterally, too small to characterize, but statistically  likely to represent tiny cysts. Bilateral adrenal glands are normal in appearance. No hydroureteronephrosis. Foley balloon catheter in the lumen of the urinary bladder. Small amount of gas non dependently in the lumen of the urinary bladder is iatrogenic. Urinary bladder is otherwise normal in appearance. Stomach/Bowel: Unenhanced appearance of the stomach is normal. No pathologic dilatation of small bowel or colon. Numerous colonic diverticulae are noted, particularly in the sigmoid colon, without surrounding inflammatory changes to suggest an acute diverticulitis at this time. Normal appendix. Vascular/Lymphatic: Vascular findings and measurements pertinent to potential TAVR procedure, as detailed below. Aortic atherosclerosis, without evidence of aneurysm or dissection in the abdominal or pelvic vasculature. No lymphadenopathy noted in the abdomen or pelvis. Reproductive: Status post hysterectomy. Ovaries are not confidently identified may be surgically absent or atrophic. Other: Small supraumbilical ventral hernia containing a short segment of mid small bowel, without evidence of bowel incarceration or obstruction at this time. No significant volume of ascites. No pneumoperitoneum. Musculoskeletal: There are no aggressive appearing lytic or blastic lesions noted in the visualized portions of the skeleton. VASCULAR MEASUREMENTS PERTINENT TO TAVR: AORTA: Minimal Aortic Diameter-12 x 11 mm Severity of Aortic Calcification-mild RIGHT PELVIS: Right Common Iliac Artery - Minimal Diameter-8.3 x 7.8 mm Tortuosity-mild Calcification-mild Right External Iliac Artery - Minimal Diameter-5.9 x 5.2 mm Tortuosity-moderate Calcification-none Right Common Femoral Artery - Minimal Diameter-6.1 x 5.6 mm Tortuosity-mild Calcification-minimal LEFT PELVIS: Left Common Iliac Artery - Minimal Diameter-7.1 x 7.0 mm Tortuosity-mild Calcification-mild Left External Iliac Artery - Minimal Diameter-5.7 x 5.2 mm Tortuosity-moderate  Calcification-none Left Common Femoral Artery - Minimal Diameter-5.3 x 5.3 mm Tortuosity-mild Calcification-none Review of the MIP images confirms the above findings. IMPRESSION: 1. Vascular findings and measurements pertinent to potential TAVR procedure, as detailed above. 2. Severe thickening calcification of the aortic valve, compatible with the reported clinical history of severe aortic stenosis. There is also severe calcifications of the mitral valve and mitral annulus. 3. Dilatation of the pulmonic trunk (3.6 cm in diameter), concerning for pulmonary arterial hypertension. 4. Small left and small to moderate right pleural effusions lying dependently with some associated passive subsegmental atelectasis in the lower lobes of the lungs bilaterally. 5. Multiple tiny 2-3 mm pulmonary nodules scattered throughout the periphery of the lungs bilaterally, nonspecific, but strongly favored to represent benign areas of mucoid impaction within terminal bronchioles.  No follow-up needed if patient is low-risk (and has no known or suspected primary neoplasm). Non-contrast chest CT can be considered in 12 months if patient is high-risk. This recommendation follows the consensus statement: Guidelines for Management of Incidental Pulmonary Nodules Detected on CT Images: From the Fleischner Society 2017; Radiology 2017; 284:228-243. 6. Colonic diverticulosis without evidence of acute diverticulitis at this time. 7. Small epigastric ventral hernia, containing a short segment of mid small bowel, without evidence of bowel incarceration or obstruction at this time. 8. Additional incidental findings, as above. Electronically Signed   By: Vinnie Langton M.D.   On: 06/28/2018 08:18   Ct Angio Abd/pel W/ And/or W/o  Result Date: 06/28/2018 CLINICAL DATA:  73 year old female with history of severe aortic stenosis. Preprocedural study prior to potential transcatheter aortic valve replacement (TAVR) procedure. EXAM: CT ANGIOGRAPHY  CHEST, ABDOMEN AND PELVIS TECHNIQUE: Multidetector CT imaging through the chest, abdomen and pelvis was performed using the standard protocol during bolus administration of intravenous contrast. Multiplanar reconstructed images and MIPs were obtained and reviewed to evaluate the vascular anatomy. CONTRAST:  153m OMNIPAQUE IOHEXOL 350 MG/ML SOLN COMPARISON:  No priors. FINDINGS: CTA CHEST FINDINGS Cardiovascular: Heart size is borderline enlarged. There is no significant pericardial fluid, thickening or pericardial calcification. There is aortic atherosclerosis, as well as atherosclerosis of the great vessels of the mediastinum and the coronary arteries, including calcified atherosclerotic plaque in the left main and left anterior descending coronary arteries. Thickening and calcification of the aortic valve. There is also severe thickening of the mitral valve and mitral annulus. Dilatation of the pulmonic trunk (3.6 cm in diameter). Mediastinum/Lymph Nodes: No pathologically enlarged mediastinal or hilar lymph nodes. Esophagus is unremarkable in appearance. No axillary lymphadenopathy. Lungs/Pleura: Several tiny calcified granulomas are scattered throughout both lungs. Other tiny 2-3 mm pulmonary nodules are also noted in the periphery of the lungs, nonspecific, but statistically likely benign areas of mucoid impaction. No larger more suspicious appearing pulmonary nodules or masses are noted. No acute consolidative airspace disease. Small left and small to moderate right pleural effusions lying dependently with some associated passive subsegmental atelectasis in the dependent portions of the lower lobes of the lungs bilaterally. Musculoskeletal/Soft Tissues: Status post left shoulder arthroplasty. There are no aggressive appearing lytic or blastic lesions noted in the visualized portions of the skeleton. Status post left modified radical mastectomy. CTA ABDOMEN AND PELVIS FINDINGS Hepatobiliary: No suspicious  cystic or solid hepatic lesions. No intra or extrahepatic biliary ductal dilatation. Gallbladder is normal in appearance. Pancreas: No pancreatic mass. No pancreatic ductal dilatation. No pancreatic or peripancreatic fluid or inflammatory changes. Spleen: Unremarkable. Adrenals/Urinary Tract: Subcentimeter low-attenuation lesions in the kidneys bilaterally, too small to characterize, but statistically likely to represent tiny cysts. Bilateral adrenal glands are normal in appearance. No hydroureteronephrosis. Foley balloon catheter in the lumen of the urinary bladder. Small amount of gas non dependently in the lumen of the urinary bladder is iatrogenic. Urinary bladder is otherwise normal in appearance. Stomach/Bowel: Unenhanced appearance of the stomach is normal. No pathologic dilatation of small bowel or colon. Numerous colonic diverticulae are noted, particularly in the sigmoid colon, without surrounding inflammatory changes to suggest an acute diverticulitis at this time. Normal appendix. Vascular/Lymphatic: Vascular findings and measurements pertinent to potential TAVR procedure, as detailed below. Aortic atherosclerosis, without evidence of aneurysm or dissection in the abdominal or pelvic vasculature. No lymphadenopathy noted in the abdomen or pelvis. Reproductive: Status post hysterectomy. Ovaries are not confidently identified may be surgically absent or atrophic.  Other: Small supraumbilical ventral hernia containing a short segment of mid small bowel, without evidence of bowel incarceration or obstruction at this time. No significant volume of ascites. No pneumoperitoneum. Musculoskeletal: There are no aggressive appearing lytic or blastic lesions noted in the visualized portions of the skeleton. VASCULAR MEASUREMENTS PERTINENT TO TAVR: AORTA: Minimal Aortic Diameter-12 x 11 mm Severity of Aortic Calcification-mild RIGHT PELVIS: Right Common Iliac Artery - Minimal Diameter-8.3 x 7.8 mm Tortuosity-mild  Calcification-mild Right External Iliac Artery - Minimal Diameter-5.9 x 5.2 mm Tortuosity-moderate Calcification-none Right Common Femoral Artery - Minimal Diameter-6.1 x 5.6 mm Tortuosity-mild Calcification-minimal LEFT PELVIS: Left Common Iliac Artery - Minimal Diameter-7.1 x 7.0 mm Tortuosity-mild Calcification-mild Left External Iliac Artery - Minimal Diameter-5.7 x 5.2 mm Tortuosity-moderate Calcification-none Left Common Femoral Artery - Minimal Diameter-5.3 x 5.3 mm Tortuosity-mild Calcification-none Review of the MIP images confirms the above findings. IMPRESSION: 1. Vascular findings and measurements pertinent to potential TAVR procedure, as detailed above. 2. Severe thickening calcification of the aortic valve, compatible with the reported clinical history of severe aortic stenosis. There is also severe calcifications of the mitral valve and mitral annulus. 3. Dilatation of the pulmonic trunk (3.6 cm in diameter), concerning for pulmonary arterial hypertension. 4. Small left and small to moderate right pleural effusions lying dependently with some associated passive subsegmental atelectasis in the lower lobes of the lungs bilaterally. 5. Multiple tiny 2-3 mm pulmonary nodules scattered throughout the periphery of the lungs bilaterally, nonspecific, but strongly favored to represent benign areas of mucoid impaction within terminal bronchioles. No follow-up needed if patient is low-risk (and has no known or suspected primary neoplasm). Non-contrast chest CT can be considered in 12 months if patient is high-risk. This recommendation follows the consensus statement: Guidelines for Management of Incidental Pulmonary Nodules Detected on CT Images: From the Fleischner Society 2017; Radiology 2017; 284:228-243. 6. Colonic diverticulosis without evidence of acute diverticulitis at this time. 7. Small epigastric ventral hernia, containing a short segment of mid small bowel, without evidence of bowel incarceration or  obstruction at this time. 8. Additional incidental findings, as above. Electronically Signed   By: Vinnie Langton M.D.   On: 06/28/2018 08:18   Disposition   Pt is being discharged home today in good condition.  Follow-up Plans & Appointments    Follow-up Information    Eileen Stanford, PA-C. Go on 07/07/2018.   Specialties:  Cardiology, Radiology Why:  @ 11am this will be a virtual visit. Please see insctrcutions in discharge paperwork for further details and consent information  Contact information: [address] [place] [address] [phone]            Discharge Medications   Allergies as of 06/30/2018      Reactions   Pneumovax [pneumococcal Polysaccharide Vaccine] Swelling   Arm swelling   Sulfa Antibiotics Other (See Comments)   UNSPECIFIED CHILDHOOD REACTION   Levaquin [levofloxacin] Rash      Medication List    STOP taking these medications   isosorbide mononitrate 30 MG 24 hr tablet Commonly known as:  IMDUR   predniSONE 10 MG tablet Commonly known as:  DELTASONE     TAKE these medications   acetaminophen 325 MG tablet Commonly known as:  TYLENOL Take 650 mg by mouth every 6 (six) hours as needed for mild pain or fever.   amiodarone 200 MG tablet Commonly known as:  Pacerone Take 1 tablet (200 mg total) by mouth daily.   aspirin 81 MG chewable tablet  Chew 1 tablet (81 mg total) by mouth daily.   atorvastatin 40 MG tablet Commonly known as:  LIPITOR Take 40 mg by mouth daily.   azelastine 0.1 % nasal spray Commonly known as:  ASTELIN Place 1 spray into both nostrils 2 (two) times daily. Use in each nostril as directed   Breo Ellipta 200-25 MCG/INH Aepb Generic drug:  fluticasone furoate-vilanterol Inhale 1 puff into the lungs daily.   calcium carbonate 1250 (500 Ca) MG tablet Commonly known as:  OS-CAL - dosed in mg of elemental calcium Take 1 tablet by mouth 2 (two) times daily with a meal.   doxycycline 100 MG  tablet Commonly known as:  VIBRA-TABS Take 100 mg by mouth 2 (two) times daily.   DULoxetine 30 MG capsule Commonly known as:  CYMBALTA Take 30 mg by mouth 2 (two) times daily.   Eliquis 5 MG Tabs tablet Generic drug:  apixaban Take 5 mg by mouth 2 (two) times daily. Next dose due 4/22 PM   esomeprazole 40 MG capsule Commonly known as:  NEXIUM Take 40 mg by mouth daily at 12 noon.   furosemide 40 MG tablet Commonly known as:  LASIX Take 1 tablet (40 mg total) by mouth daily. What changed:    medication strength  how much to take   magnesium 30 MG tablet Take 30 mg by mouth 2 (two) times daily.   metFORMIN 500 MG tablet Commonly known as:  GLUCOPHAGE Take 1,000 mg by mouth 2 (two) times daily with a meal. HOLD x 48 hours after contrast dye. Okay to resume 4/23 PM   metoprolol succinate 25 MG 24 hr tablet Commonly known as:  TOPROL-XL Take 25 mg by mouth daily.   montelukast 10 MG tablet Commonly known as:  SINGULAIR Take 10 mg by mouth at bedtime.   multivitamin with minerals Tabs tablet Take 1 tablet by mouth daily.   oxybutynin 10 MG 24 hr tablet Commonly known as:  DITROPAN-XL Take 10 mg by mouth at bedtime.   potassium chloride 10 MEQ tablet Commonly known as:  K-DUR Take 1 tablet (10 mEq total) by mouth daily.   sitaGLIPtin 25 MG tablet Commonly known as:  JANUVIA Take 25 mg by mouth daily.   vitamin B-12 100 MCG tablet Commonly known as:  CYANOCOBALAMIN Take 100 mcg by mouth daily.   vitamin E 100 UNIT capsule Take 100 Units by mouth daily.        Outstanding Labs/Studies   none  Duration of Discharge Encounter   Greater than 30 minutes including physician time.  Signed, Angelena Form, PA-C 06/30/2018, 10:21 AM   Patient seen, examined. Available data reviewed. Agree with findings, assessment, and plan as outlined by Nell Range, PA-C.  The patient is independently interviewed and examined.  She is an alert, oriented woman in no  distress.  Lung fields are clear, JVP is normal, heart is regular rate and rhythm with a 2/6 systolic ejection murmur at the right upper sternal border.  Abdomen soft nontender.  Bilateral groin sites are clear and nontender to palpation.  There is no lower extremity edema.  The patient's echocardiogram is personally reviewed with the formal interpretation pending.  The echo demonstrates normal LV systolic function, mildly elevated transvalvular gradients with a mean transaortic valve gradient of 18 mmHg, and no paravalvular regurgitation.  The mean transmitral gradient is 5 mmHg and there is mild mitral regurgitation.  The patient appears stable for hospital discharge.  Her telemetry is reviewed and shows  normal sinus rhythm with pre-existing left bundle branch block unchanged.  We will discontinue IV amiodarone, start her on oral amiodarone 200 blocker, and otherwise continue her current medical program.  She can start back on apixaban tonight at her normal dose.  Hospital follow-up will be arranged.  The patient has 24/7 care with her daughter who she will now live with.  Sherren Mocha, M.D. 06/30/2018 10:57 AM   [phone]

## 2018-06-30 NOTE — Progress Notes (Signed)
Patient given discharge instructions, medication list and prescriptions sent to personal pharmacy. Follow up appointments given to patient verbalized understanding. IV and tele were dcd. Will discharge home as ordered. Transported to exit via wheel chair and nursing staff. Discharge instructions also reviewed with Daughter via phone. [name], Bettina Gavia rN

## 2018-06-30 NOTE — TOC Transition Note (Signed)
Transition of Care [facility]) - CM/SW Discharge Note Marvetta Gibbons RN, BSN Transitions of Care Unit 4E- RN Case Manager [phone]  Patient Details  Name: Tanya [name] MRN: [id number] Date of Birth: Aug 23, 1945  Transition of Care [facility]) CM/SW Contact:  Dawayne Patricia, RN Phone Number: 06/30/2018, 3:58 PM   Clinical Narrative:    Pt s/p TAVR on 4/21- pre-op noted PT had recommended HHPT- no orders placed post procedure- bedside RN to f/u- cardiac rehab worked with pt- no needs noted.    Final next level of care: Home/Self Care Barriers to Discharge: No Barriers Identified   Patient Goals and CMS Choice Patient states their goals for this hospitalization and ongoing recovery are:: "to return home" CMS Medicare.gov Compare Post Acute Care list provided to:: Patient Choice offered to / list presented to : NA  Discharge Placement  Pt to transition home with daughter who will provide 24/7 care.                       Discharge Plan and Services   Discharge Planning Services: CM Consult Post Acute Care Choice: NA                    Social Determinants of Health (SDOH) Interventions     Readmission Risk Interventions No flowsheet data found.

## 2018-06-30 NOTE — Progress Notes (Signed)
  Echocardiogram 2D Echocardiogram has been performed.  Tanya [name] 06/30/2018, 9:18 AM

## 2018-06-30 NOTE — Progress Notes (Signed)
Cardiac Rehab Advisory Cardiac Rehab Phase I is not seeing pts face to face at this time due to Covid 19 restrictions. Ambulation is occurring through nursing, PT, and mobility teams. We will help facilitate that process as needed. We are calling pts in their rooms and discussing education. We will then deliver education materials to pts RN for delivery to pt.   1110-1140 Called pt to go over information. Discussed CRP 2 and pt agreed to referral to [facility] program. Encouraged pt to use walker and walk with family in house and gradually increase distance as she is able. Discussed heart healthy food choices and low carb foods. Diets provided to RN who will give to pt.  Pt voiced understanding. Graylon Good RN BSN 06/30/2018 11:41 AM

## 2018-07-01 ENCOUNTER — Telehealth: Payer: Self-pay | Admitting: Physician Assistant

## 2018-07-01 NOTE — Telephone Encounter (Signed)
  [place] VALVE TEAM   Patient contacted regarding discharge from [facility] on 06/30/18  Patient understands to follow up with provider Nell Range on 4/29 @ 11am as a virtual visit Patient understands discharge instructions? yes Patient understands medications and regiment? yes Patient understands to bring all medications to this visit? yes  Angelena Form PA-C  [facility]

## 2018-07-07 ENCOUNTER — Other Ambulatory Visit: Payer: Self-pay

## 2018-07-07 ENCOUNTER — Telehealth: Payer: Medicare Other | Admitting: Physician Assistant

## 2018-07-07 ENCOUNTER — Telehealth (INDEPENDENT_AMBULATORY_CARE_PROVIDER_SITE_OTHER): Payer: Medicare Other | Admitting: Physician Assistant

## 2018-07-07 VITALS — BP 118/64 | HR 65 | Wt 191.0 lb

## 2018-07-07 DIAGNOSIS — Z8739 Personal history of other diseases of the musculoskeletal system and connective tissue: Secondary | ICD-10-CM

## 2018-07-07 DIAGNOSIS — I480 Paroxysmal atrial fibrillation: Secondary | ICD-10-CM

## 2018-07-07 DIAGNOSIS — I5032 Chronic diastolic (congestive) heart failure: Secondary | ICD-10-CM

## 2018-07-07 DIAGNOSIS — Z7189 Other specified counseling: Secondary | ICD-10-CM

## 2018-07-07 DIAGNOSIS — R918 Other nonspecific abnormal finding of lung field: Secondary | ICD-10-CM

## 2018-07-07 DIAGNOSIS — Z952 Presence of prosthetic heart valve: Secondary | ICD-10-CM | POA: Diagnosis not present

## 2018-07-07 DIAGNOSIS — I10 Essential (primary) hypertension: Secondary | ICD-10-CM

## 2018-07-07 NOTE — Progress Notes (Signed)
Thanks. That was a save! MCr

## 2018-07-07 NOTE — Progress Notes (Signed)
[facility]   MULTIDISCIPLINARY HEART VALVE TEAM     Virtual Visit via Video Note   This visit type was conducted due to national recommendations for restrictions regarding the COVID-19 Pandemic (e.g. social distancing) in an effort to limit this patient's exposure and mitigate transmission in our community.  Due to her co-morbid illnesses, this patient is at least at moderate risk for complications without adequate follow up.  This format is felt to be most appropriate for this patient at this time.  All issues noted in this document were discussed and addressed.  A limited physical exam was performed with this format.  Please refer to the patient's chart for her consent to telehealth for [facility].   Evaluation Performed:  Follow-up visit  Date:  07/07/2018   ID:  Tanya [name], DOB 09-25-1945, MRN [id number]  Patient Location: Home Provider Location: Office  PCP:  System, Pcp Not In  Cardiologist:  Sanda Klein, MD / Dr. Burt Knack & Dr. Roxy Manns ( TAVR) Electrophysiologist:  None   Chief Complaint:  TOC s/p TAVR.  History of Present Illness:    Tanya [name] is a 73 y.o. 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   Ms.Redushas lived independently in an apartment in [place], [place]. Due to recent health decline she has moved in with her daughter who lives in [place], [place]. She has a son that lives in [place]. She no longer drives due to left arm issues related to a previous shoulder injury. She can walk without a walker or cane and otherwise functions independently. She has full dentures. She has a 30 pack year smoking history but has never been  formally diagnosed with COPD. She has been wearing 2L of home oxygen over the past year or so.   She has a history ofbreast cancer s/p chemo and left radical mastectomyin 1994 and ovarian cancer s/p chemo s/p radical hysterectomyin 2010. She has left arm lymphedema since her mastectomy.   She has had bilateral total knee replacements in 2000 and 2004 and total reverse shoulder replacement in 2017. After her shoulder replacement she developed a right shoulder DVT and septic arthritis in her shoulder and knee requiring debridement and IV abx. She was also placed Coumadin. She had recurrence of septic arthritis and was placed on prophylactic doxycycline. Since her admission for septic arthritis she has been on home 02.   She has been followed by Dr. Lamar Blinks with cardiology in [place] but recently switched care to Dr. Linford Arnold. She has been followed over time for HTN, PAF on Eliquis, and aortic stenosis. Over the past 6 months, she has had a progressive decline in her functional status. Eventually she developed dyspnea on exertion with minimal exertion, hypoxia, chest tightness and dizziness with exertion.  She was admitted 4/14-4/22/20 for acute on chronichypoxic and hypercapnicrespiratory failure in the setting ofacute CHF 2/2severe aortic stenosis and moderate mitral stenosis/regurgitation. She ruled out for Covid-19. She improved quickly with IV diuresis.She was able to be extubated the following day and placed on Bipap and then transitioned to HFNC. She was seen by the structural heart team. Pre TAVR L/RHC showed widely patent coronary arteries. She is now s/p successful TAVR with a 23 mm Edwards Sapien 3  THV via the TF approach on 06/29/18. Her hosptital course was c/b afib with RVR. She was treated with IV amio and transitioned to oral amio x1 month. Post op echo showed EF 65% with mildly elevated gradients (mean 74mm Hg) and no PVL. She was discharged on aspirin and eliquis.   The  patient does not have symptoms concerning for COVID-19 infection (fever, chills, cough, or new shortness of breath).   Today she presents for a telehealth visit. She doing much better. No more chest pain. Her breathing is much improved. Weight has been stable. She still wishes she had more energy and stamina. She has been walking around the house. Complaint with her home oxygen. Occasionally gets dizziness with positional changes. No syncope. No blood in stool or urine.    Past Medical History:  Diagnosis Date   Asthma    Breast cancer in female [facility])    1994 s/p chemo and radical L mastectomy   Diabetes ([place])    DVT (deep venous thrombosis) ([place])    in shoulder after shoulder replacement   Full dentures    GERD (gastroesophageal reflux disease)    Gout    H/O total shoulder replacement    History of knee replacement, total, bilateral    History of septic arthritis    infected prosthetic shoulder and knee s/p debridement and abx. now on daily doxycycline.   HTN (hypertension)    Lymphedema of left arm    s/p mastectomy with lymph node removal for breast cancer   Morbid obesity ([facility])    Neuropathy    OSA on CPAP    Ovarian cancer ([place])    2012 s/p chemo and radical hysterectomy   PAF (paroxysmal atrial fibrillation) ([facility])    on Eliquis   Pulmonary nodules    seen on pre TAVR CT scan. needs follow up CT in 06/2019   S/P TAVR (transcatheter aortic valve replacement)    23 mm Edwards Sapien 3 transcatheter heart valve placed via percutaneous right transfemoral approach    Severe aortic stenosis    Urine incontinence    Past Surgical History:  Procedure Laterality Date   MASTECTOMY Left 1993   RIGHT/LEFT HEART CATH AND CORONARY ANGIOGRAPHY N/A 06/25/2018   Procedure: RIGHT/LEFT HEART CATH AND CORONARY ANGIOGRAPHY;  Surgeon: Belva Crome, MD;  Location: [facility];  Service: Cardiovascular;  Laterality: N/A;   TEE WITHOUT CARDIOVERSION N/A 06/29/2018    Procedure: TRANSESOPHAGEAL ECHOCARDIOGRAM (TEE);  Surgeon: Sherren Mocha, MD;  Location: [facility];  Service: Open Heart Surgery;  Laterality: N/A;   TRANSCATHETER AORTIC VALVE REPLACEMENT, TRANSFEMORAL N/A 06/29/2018   Procedure: TRANSCATHETER AORTIC VALVE REPLACEMENT, TRANSFEMORAL;  Surgeon: Sherren Mocha, MD;  Location: [facility];  Service: Open Heart Surgery;  Laterality: N/A;     Current Meds  Medication Sig   acetaminophen (TYLENOL) 325 MG tablet Take 650 mg by mouth every 6 (six) hours as needed for mild pain or fever.   amiodarone (PACERONE) 200 MG tablet Take 1 tablet (200 mg total) by mouth daily.   apixaban (ELIQUIS) 5 MG TABS tablet Take 5 mg by mouth 2 (two) times daily.   aspirin 81 MG chewable tablet Chew 1 tablet (81 mg total) by mouth daily.   atorvastatin (LIPITOR) 40 MG tablet Take 40 mg by mouth daily.   azelastine (ASTELIN) 0.1 % nasal spray Place 1 spray into both nostrils 2 (two) times daily. Use in each nostril as directed   calcium carbonate (OS-CAL - DOSED IN  MG OF ELEMENTAL CALCIUM) 1250 (500 Ca) MG tablet Take 1 tablet by mouth 2 (two) times daily with a meal.   doxycycline (VIBRA-TABS) 100 MG tablet Take 100 mg by mouth 2 (two) times daily.   DULoxetine (CYMBALTA) 30 MG capsule Take 30 mg by mouth 2 (two) times daily.   esomeprazole (NEXIUM) 40 MG capsule Take 40 mg by mouth daily at 12 noon.   fluticasone furoate-vilanterol (BREO ELLIPTA) 200-25 MCG/INH AEPB Inhale 1 puff into the lungs daily.   furosemide (LASIX) 40 MG tablet Take 1 tablet (40 mg total) by mouth daily.   magnesium 30 MG tablet Take 30 mg by mouth 2 (two) times daily.   metFORMIN (GLUCOPHAGE) 500 MG tablet Take 1,000 mg by mouth 2 (two) times daily with a meal.   metoprolol succinate (TOPROL-XL) 25 MG 24 hr tablet Take 25 mg by mouth daily.   montelukast (SINGULAIR) 10 MG tablet Take 10 mg by mouth at bedtime.   Multiple Vitamin (MULTIVITAMIN WITH MINERALS) TABS tablet Take 1  tablet by mouth daily.   oxybutynin (DITROPAN-XL) 10 MG 24 hr tablet Take 10 mg by mouth at bedtime.   potassium chloride (K-DUR) 10 MEQ tablet Take 1 tablet (10 mEq total) by mouth daily.   sitaGLIPtin (JANUVIA) 25 MG tablet Take 25 mg by mouth daily.   vitamin B-12 (CYANOCOBALAMIN) 100 MCG tablet Take 100 mcg by mouth daily.   vitamin E 100 UNIT capsule Take 100 Units by mouth daily.     Allergies:   Pneumovax [pneumococcal polysaccharide vaccine]; Sulfa antibiotics; and Levaquin [levofloxacin]   Social History   Tobacco Use   Smoking status: Former Smoker    Types: Cigarettes   Smokeless tobacco: Never Used  Substance Use Topics   Alcohol use: Not on file   Drug use: Not on file     Family Hx: The patient's family history includes Heart disease in her father; Hypertension in her mother; Renal cancer in her brother.  ROS:   Please see the history of present illness.    All other systems reviewed and are negative.   Prior CV studies:   The following studies were reviewed today:  Cardiac catheterization 06/25/2018:  Left dominant coronary anatomy with normal coronary arteries.  Severe aortic stenosis.  Moderate mitral stenosis.  Normal pulmonary artery pressures  Normal filling pressures.LVEDP 13 mmHg and pulmonary capillary wedge mean 15 mmHg.  Severe mitral annular calcification and to a lesser extent aortic valve calcification on cine fluoroscopy.  RECOMMENDATIONS:   Discussed with Dr. Sherren Mocha.  Consideration is being given to TAVR.  _____________   Echocardiogram 06/23/2018: 1. The left ventricle has mild-moderately reduced systolic function, with an ejection fraction of 40-45%. The cavity size was normal. Left ventricular diastolic function could not be evaluated. Left ventricular diffuse hypokinesis. 2. The right ventricle has normal systolic function. The cavity was normal. There is no increase in right ventricular wall  thickness. Right ventricular systolic pressure is moderately elevated with an estimated pressure of 64.6 mmHg. 3. Left atrial size was moderately dilated. 4. Right atrial size was moderately dilated. 5. The mitral valve is degenerative. Mild thickening of the mitral valve leaflet. There is moderate mitral annular calcification present. Mitral valve regurgitation is moderate by color flow Doppler. Moderate mitral valve stenosis. 6. The aortic valve is tricuspid. Severely thickening of the aortic valve. Severe calcifcation of the aortic valve. Aortic valve regurgitation is moderate by color flow Doppler. Severe stenosis of the aortic valve. 7. Pulmonary hypertension  is moderate.  _____________   Cardiac CT 06/26/18 IMPRESSION: 1. Trileaflet aortic valve with only moderately thickened and mildly calcified leaflets and minimal calcifications extending into the LVOT. Leaflet opening is severely restricted. Annular measurements suitable for delivery of a 23 mm Edwards-SAPIEN 3 valve (lower end for measurements).  2. Sufficient coronary to annulus distance.  3. Optimum Fluoroscopic Angle for Delivery: LAO 1 CAU 0  4. No thrombus in a large left atrial appendage.  5. Severe mitral annular calcifications.  6. Severely dilated pulmonary artery measuring 40 mm consistent with pulmonary hypertension.  _____________  CTA chest/abd/pelvis 06/26/18 IMPRESSION: 1. Vascular findings and measurements pertinent to potential TAVR procedure, as detailed above. 2. Severe thickening calcification of the aortic valve, compatible with the reported clinical history of severe aortic stenosis. There is also severe calcifications of the mitral valve and mitral annulus. 3. Dilatation of the pulmonic trunk (3.6 cm in diameter), concerning for pulmonary arterial hypertension. 4. Small left and small to moderate right pleural effusions lying dependently with some associated passive  subsegmental atelectasis in the lower lobes of the lungs bilaterally. 5. Multiple tiny 2-3 mm pulmonary nodules scattered throughout the periphery of the lungs bilaterally, nonspecific, but strongly favored to represent benign areas of mucoid impaction within terminal bronchioles. No follow-up needed if patient is low-risk (and has no known or suspected primary neoplasm). Non-contrast chest CT can be considered in 12 months if patient is high-risk. This recommendation follows the consensus statement: Guidelines for Management of Incidental Pulmonary Nodules Detected on CT Images: From the Fleischner Society 2017; Radiology 2017; 284:228-243. 6. Colonic diverticulosis without evidence of acute diverticulitis at this time. 7. Small epigastric ventral hernia, containing a short segment of mid small bowel, without evidence of bowel incarceration or obstruction at this time. 8. Additional incidental findings, as above.   _____________   TAVR OPERATIVE NOTE   Date of Procedure:06/29/2018  Preoperative Diagnosis:Severe Aortic Stenosis   Postoperative Diagnosis:Same   Procedure:   Transcatheter Aortic Valve Replacement - PercutaneousRightTransfemoral Approach Edwards Sapien 3 THV (size 44mm, model # 9600TFX, serial # [id number])  Co-Surgeons:Clarence H. Roxy Manns, MD and Sherren Mocha, MD  Anesthesiologist:Charlene Nyoka Cowden, MD  Dala Dock, MD  Pre-operative Echo Findings: ? Severe aortic stenosis ? Moderate mitral stenosis ? Normalleft ventricular systolic function  Post-operative Echo Findings: ? Noparavalvular leak ? Normalleft ventricular systolic function  _____________    Echo 06/30/18 IMPRESSIONS  1. The left ventricle has normal systolic function with an ejection fraction of 60-65%. The cavity size was normal. There is severe  concentric left ventricular hypertrophy. Left ventricular diastolic Doppler parameters are consistent with  pseudonormalization. Elevated left ventricular end-diastolic pressure.  2. The right ventricle has normal systolic function. The cavity was normal. There is no increase in right ventricular wall thickness. Right ventricular systolic pressure is moderately elevated with an estimated pressure of 44.2 mmHg.  3. Left atrial size was mildly dilated.  4. The mitral valve is degenerative. Severe thickening of the mitral valve leaflet. Moderate calcification of the mitral valve leaflet. There is moderate to severe mitral annular calcification present. Moderate mitral valve stenosis. MV Mean grad: 5.0  mmHg.  5. - TAVR: S/P Edwards Sapien 3 THV 58mm TAVR which appears well seated. There is no perivalvular AI. AV Area (VTI):1.02 cm. AV Mean Grad:19.2 mmHg. LVOT/AV VTI ratio:0.45.  6. Compared to prior report a TAVR is now present.  Labs/Other Tests and Data Reviewed:    EKG:  No ECG reviewed.  Recent Labs: 06/22/2018: ALT 34;  B Natriuretic Peptide 447.3 06/28/2018: TSH 3.436 06/30/2018: BUN 7; Creatinine, Ser 0.63; Hemoglobin 10.5; Magnesium 1.3; Platelets 249; Potassium 4.1; Sodium 139   Recent Lipid Panel Lab Results  Component Value Date/Time   CHOL 176 06/25/2018 12:23 PM   TRIG 187 (H) 06/25/2018 12:23 PM   HDL 55 06/25/2018 12:23 PM   CHOLHDL 3.2 06/25/2018 12:23 PM   LDLCALC 84 06/25/2018 12:23 PM    Wt Readings from Last 3 Encounters:  07/07/18 191 lb (86.6 kg)  06/30/18 194 lb (88 kg)     Objective:    Vital Signs:  BP 118/64    Pulse 65    Wt 191 lb (86.6 kg)    SpO2 97%    BMI 42.82 kg/m    Well nourished, well developed female in no acute distress. Obese. No LE edema.    ASSESSMENT & PLAN:     Severe AS s/p TAVR: doing great. Still working on gaining strength and energy but breathing is much improved. Groin sites are healing well. No s/s HAVB. HR in 60s. SBE  prophylaxis discussed; the patient is edentulous and does not go to the dentist. Continue aspirin and eliquis. I will see her back next month for a virtual visit.   Chronic combined S/D CHF: post TAVR echo with normalized EF ~60%. Weight is stable. Continue on lasix 40mg  daily and KDur 10 mEq daily.   PAF: no more palpations. Continue amio 200mg  x 1 month. Continue Toprol XL 25mg  daily and Eliquis 5mg  BID.   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   HTN: BP well controlled. Continue current medications.   Pulmonary nodules: noted on pre TAVR CT scan. With 30 year pack history of smoking, she will need follow up CT scan in 12 months. This will be arranged at 1 year follow up.   COVID-19 Education: the signs and symptoms of COVID-19 were discussed with the patient and how to seek care for testing (follow up with PCP or arrange E-visit).  The importance of social distancing was discussed today.  Time:   Today, I have spent 25 minutes with the patient with telehealth technology discussing the above problems.     Medication Adjustments/Labs and Tests Ordered: Current medicines are reviewed at length with the patient today.  Concerns regarding medicines are outlined above.   Tests Ordered: No orders of the defined types were placed in this encounter.   Medication Changes: No orders of the defined types were placed in this encounter.   Disposition:  Follow up in 1 month(s)  Signed, Angelena Form, PA-C  07/07/2018 12:46 PM    [place]

## 2018-07-07 NOTE — Patient Instructions (Signed)
Hello Tanya [name],   It was so nice to talk to you on the audio/video chat today. I am so glad you are doing so well. I just wanted to send you a recap of our discussion.   Please continue on all your same medications.    Your 1 month echo has been moved up to 6/25. I will see you back for a virtual visit next month.   All appointment details are listed in upcoming appointments in [place].  Please call us with any questions or concerns you may have and please stay safe during these uncertain times.  [place]ell [place] [phone]

## 2018-07-08 ENCOUNTER — Telehealth (HOSPITAL_COMMUNITY): Payer: Self-pay | Admitting: *Deleted

## 2018-07-08 NOTE — Telephone Encounter (Signed)
Called and left message on pt's daughter/DPR Norton Blizzard in regards to Cardiac Rehab continued closure due to adherence of national recommendation for Covid-19 in group setting. Also inquiring about pts well being and any educational needs for exercise and heart healthy diet. Requested call back. Contact number provided. Cherre Huger, BSN Cardiac and Training and development officer

## 2018-07-28 ENCOUNTER — Other Ambulatory Visit: Payer: Self-pay | Admitting: Physician Assistant

## 2018-07-28 NOTE — Progress Notes (Signed)
[facility]   MULTIDISCIPLINARY HEART VALVE TEAM     Virtual Visit via Video Note   This visit type was conducted due to national recommendations for restrictions regarding the COVID-19 Pandemic (e.g. social distancing) in an effort to limit this patient's exposure and mitigate transmission in our community.  Due to her co-morbid illnesses, this patient is at least at moderate risk for complications without adequate follow up.  This format is felt to be most appropriate for this patient at this time.  All issues noted in this document were discussed and addressed.  A limited physical exam was performed with this format.  Please refer to the patient's chart for her consent to telehealth for [facility].   Evaluation Performed:  Follow-up visit  Date:  07/29/2018   ID:  Tanya [name], DOB August 20, 1945, MRN [id number]  Patient Location: Home Provider Location: Office  PCP:  System, Pcp Not In  Cardiologist:  Sanda Klein, MD / Dr. Burt Knack & Dr. Roxy Manns ( TAVR) Electrophysiologist:  None   Chief Complaint:  1 month s/p TAVR  History of Present Illness:    Tanya [name] is a 73 y.o. 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   The patient does not have symptoms concerning for COVID-19 infection (fever, chills, cough, or new shortness of breath).   Ms.Redushas lived independently in an apartment in [place], [place]. Due to recent health decline she has moved in with her daughter who lives in [place], [place]. She has a son that lives in [place]. She no longer drives due to left arm issues related to a previous shoulder injury. She can walk without a  walker or cane and otherwise functions independently. She has full dentures. She has a 30 pack year smoking history but has never been formally diagnosed with COPD. She has been wearing 2L of home oxygen over the past year or so.   She has a history ofbreast cancer s/p chemo and left radical mastectomyin 1994 and ovarian cancer s/p chemo s/p radical hysterectomyin 2010. She has left arm lymphedema since her mastectomy.   She has had bilateral total knee replacements in 2000 and 2004 and total reverse shoulder replacement in 2017. After her shoulder replacement she developed a right shoulder DVT and septic arthritis in her shoulder and knee requiring debridement and IV abx. She was also placed Coumadin. She had recurrence of septic arthritis and was placed on prophylactic doxycycline. Since her admission for septic arthritis she has been on home 02.   She has been followed by Dr. Lamar Blinks with cardiology in [place] but recently switched care to Dr. Linford Arnold. She has been followed over time for HTN, PAF on Eliquis, and aortic stenosis. Over the past 6 months, she has had a progressive decline in her functional status. Eventually she developed dyspnea on exertion with minimal exertion, hypoxia, chest tightness and dizziness with exertion.  She was admitted 4/14-4/22/20 for acute on chronichypoxic and hypercapnicrespiratory failure in the setting ofacute CHF 2/2severe aortic stenosis and moderate mitral stenosis/regurgitation. Tanya [name]. She improved quickly with IV diuresis.She was able to be extubated the following day and placed on Bipapand then transitioned to HFNC.She was seen by the structural heart team. Pre TAVRL/RHC  showed widely patent coronary arteries.  She is now s/psuccessful TAVR with a 24mm Edwards Sapien 3 THV via the TF approach on 06/29/18. Her hosptital course was c/b afib with RVR. She was treated with IV amio and transitioned to oral amio x1  month. Post op echo showed EF 65% with mildly elevated gradients (mean 37mm Hg) and no PVL. She was discharged on aspirin and eliquis.   Today she presents for follow up via telemedicine. She is doing well. She feels so much better since having her TAVR. No CP or SOB. No LE edema, orthopnea or PND. No dizziness or syncope. No blood in stool or urine. She had a couple episodes of short lived palpitations, HRs have been in normal range on pulse ox with one episode with HR in 140s. She had one episode of double vision this AM. No associated aphasia, dizziness, weakness, headache. She used to have episodes where she felt like her heart was racing and she was being smothered while in shower. These have completely resolved since TAVR.     Past Medical History:  Diagnosis Date  . Asthma   . Breast cancer in female [facility])    1994 s/p chemo and radical L mastectomy  . Diabetes ([place])   . DVT (deep venous thrombosis) (H[facility])    in shoulder after shoulder replacement  . Full dentures   . GERD (gastroesophageal reflux disease)   . Gout   . H/O total shoulder replacement   . History of knee replacement, total, bilateral   . 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 now on daily doxycycline.  Marland Kitchen HTN (hypertension)   . Lymphedema of left arm    s/p mastectomy with lymph node removal for breast cancer  . Morbid obesity ([place])   . Neuropathy   . OSA on CPAP   . Ovarian cancer ([place])    2012 s/p chemo and radical hysterectomy  . PAF (paroxysmal atrial fibrillation) (HCC)    on Eliquis  . Pulmonary nodules    seen on pre TAVR CT scan. needs follow up CT in 06/2019  . S/P TAVR (transcatheter aortic valve replacement)    23 mm Edwards Sapien 3 transcatheter heart valve placed via percutaneous right transfemoral approach   . Severe aortic stenosis   . Urine incontinence    Past Surgical History:  Procedure Laterality Date  . MASTECTOMY Left 1993  .  RIGHT/LEFT HEART CATH AND CORONARY ANGIOGRAPHY N/A 06/25/2018   Procedure: RIGHT/LEFT HEART CATH AND CORONARY ANGIOGRAPHY;  Surgeon: Belva Crome, MD;  Location: [place] [facility];  Service: Cardiovascular;  Laterality: N/A;  . TEE WITHOUT CARDIOVERSION N/A 06/29/2018   Procedure: TRANSESOPHAGEAL ECHOCARDIOGRAM (TEE);  Surgeon: Sherren Mocha, MD;  Location: [facility];  Service: Open Heart Surgery;  Laterality: N/A;  . TRANSCATHETER AORTIC VALVE REPLACEMENT, TRANSFEMORAL N/A 06/29/2018   Procedure: TRANSCATHETER AORTIC VALVE REPLACEMENT, TRANSFEMORAL;  Surgeon: Sherren Mocha, MD;  Location: [facility];  Service: Open Heart Surgery;  Laterality: N/A;     Current Meds  Medication Sig  . apixaban (ELIQUIS) 5 MG TABS tablet Take 5 mg by mouth 2 (two) times daily.  Marland Kitchen aspirin 81 MG chewable tablet Chew 1 tablet (81 mg total) by mouth daily.  Marland Kitchen atorvastatin (LIPITOR) 40 MG tablet Take 40 mg by mouth daily.  Marland Kitchen azelastine (ASTELIN) 0.1 % nasal spray Place 1 spray into both nostrils 2 (two) times daily. Use in each nostril as directed  .  calcium carbonate (OS-CAL - DOSED IN MG OF ELEMENTAL CALCIUM) 1250 (500 Ca) MG tablet Take 1 tablet by mouth 2 (two) times daily with a meal.  . doxycycline (VIBRA-TABS) 100 MG tablet Take 100 mg by mouth 2 (two) times daily.  . DULoxetine (CYMBALTA) 30 MG capsule Take 30 mg by mouth 2 (two) times daily.  Marland Kitchen esomeprazole (NEXIUM) 40 MG capsule Take 40 mg by mouth daily at 12 noon.  . fluticasone furoate-vilanterol (BREO ELLIPTA) 200-25 MCG/INH AEPB Inhale 1 puff into the lungs daily.  . furosemide (LASIX) 20 MG tablet Take 1.5 tablets (30 mg total) by mouth daily.  . metFORMIN (GLUCOPHAGE) 500 MG tablet Take 1,000 mg by mouth 2 (two) times daily with a meal.  . metoprolol succinate (TOPROL-XL) 25 MG 24 hr tablet Take 25 mg by mouth daily.  . montelukast (SINGULAIR) 10 MG tablet Take 10 mg by mouth at bedtime.  . Multiple Vitamin (MULTIVITAMIN WITH MINERALS) TABS tablet Take 1  tablet by mouth daily.  Marland Kitchen oxybutynin (DITROPAN-XL) 10 MG 24 hr tablet Take 10 mg by mouth at bedtime.  . potassium chloride (K-DUR) 10 MEQ tablet Take 1 tablet (10 mEq total) by mouth daily.  . sitaGLIPtin (JANUVIA) 25 MG tablet Take 25 mg by mouth daily.  . vitamin B-12 (CYANOCOBALAMIN) 100 MCG tablet Take 100 mcg by mouth daily.  . vitamin E 100 UNIT capsule Take 100 Units by mouth daily.  . [DISCONTINUED] amiodarone (PACERONE) 200 MG tablet Take 1 tablet (200 mg total) by mouth daily.  . [DISCONTINUED] furosemide (LASIX) 40 MG tablet Take 1 tablet (40 mg total) by mouth daily.     Allergies:   Pneumovax [pneumococcal polysaccharide vaccine]; Sulfa antibiotics; and Levaquin [levofloxacin]   Social History   Tobacco Use  . Smoking status: Former Smoker    Types: Cigarettes  . Smokeless tobacco: Never Used  Substance Use Topics  . Alcohol use: Not on file  . Drug use: Not on file     Family Hx: The patient's family history includes Heart disease in her father; Hypertension in her mother; Renal cancer in her brother.  ROS:   Please see the history of present illness.    All other systems reviewed and are negative.   Prior CV studies:   The following studies were reviewed today:   Cardiac catheterization 06/25/2018:  Left dominant coronary anatomy with normal coronary arteries.  Severe aortic stenosis.  Moderate mitral stenosis.  Normal pulmonary artery pressures  Normal filling pressures.LVEDP 13 mmHg and pulmonary capillary wedge mean 15 mmHg.  Severe mitral annular calcification and to a lesser extent aortic valve calcification on cine fluoroscopy.  RECOMMENDATIONS:   Discussed with Dr. Sherren Mocha.  Consideration is being given to TAVR.  _____________   Echocardiogram 06/23/2018: 1. The left ventricle has mild-moderately reduced systolic function, with an ejection fraction of 40-45%. The cavity size was normal. Left ventricular diastolic  function could not be evaluated. Left ventricular diffuse hypokinesis. 2. The right ventricle has normal systolic function. The cavity was normal. There is no increase in right ventricular wall thickness. Right ventricular systolic pressure is moderately elevated with an estimated pressure of 64.6 mmHg. 3. Left atrial size was moderately dilated. 4. Right atrial size was moderately dilated. 5. The mitral valve is degenerative. Mild thickening of the mitral valve leaflet. There is moderate mitral annular calcification present. Mitral valve regurgitation is moderate by color flow Doppler. Moderate mitral valve stenosis. 6. The aortic valve is tricuspid. Severely thickening of the  aortic valve. Severe calcifcation of the aortic valve. Aortic valve regurgitation is moderate by color flow Doppler. Severe stenosis of the aortic valve. 7. Pulmonary hypertension is moderate.  _____________   Cardiac CT 06/26/18 IMPRESSION: 1. Trileaflet aortic valve with only moderately thickened and mildly calcified leaflets and minimal calcifications extending into the LVOT. Leaflet opening is severely restricted. Annular measurements suitable for delivery of a 23 mm Edwards-SAPIEN 3 valve (lower end for measurements).  2. Sufficient coronary to annulus distance.  3. Optimum Fluoroscopic Angle for Delivery: LAO 1 CAU 0  4. No thrombus in a large left atrial appendage.  5. Severe mitral annular calcifications.  6. Severely dilated pulmonary artery measuring 40 mm consistent with pulmonary hypertension.  _____________  CTA chest/abd/pelvis 06/26/18 IMPRESSION: 1. Vascular findings and measurements pertinent to potential TAVR procedure, as detailed above. 2. Severe thickening calcification of the aortic valve, compatible with the reported clinical history of severe aortic stenosis. There is also severe calcifications of the mitral valve and mitral annulus. 3. Dilatation of the pulmonic  trunk (3.6 cm in diameter), concerning for pulmonary arterial hypertension. 4. Small left and small to moderate right pleural effusions lying dependently with some associated passive subsegmental atelectasis in the lower lobes of the lungs bilaterally. 5. Multiple tiny 2-3 mm pulmonary nodules scattered throughout the periphery of the lungs bilaterally, nonspecific, but strongly favored to represent benign areas of mucoid impaction within terminal bronchioles. No follow-up needed if patient is low-risk (and has no known or suspected primary neoplasm). Non-contrast chest CT can be considered in 12 months if patient is high-risk. This recommendation follows the consensus statement: Guidelines for Management of Incidental Pulmonary Nodules Detected on CT Images: From the Fleischner Society 2017; Radiology 2017; 284:228-243. 6. Colonic diverticulosis without evidence of acute diverticulitis at this time. 7. Small epigastric ventral hernia, containing a short segment of mid small bowel, without evidence of bowel incarceration or obstruction at this time. 8. Additional incidental findings, as above.   _____________   TAVR OPERATIVE NOTE   Date of Procedure:06/29/2018  Preoperative Diagnosis:Severe Aortic Stenosis   Postoperative Diagnosis:Same   Procedure:   Transcatheter Aortic Valve Replacement - PercutaneousRightTransfemoral Approach Edwards Sapien 3 THV (size 5mm, model # 9600TFX, serial # [id number])  Co-Surgeons:Clarence H. Roxy Manns, MD and Sherren Mocha, MD  Anesthesiologist:Charlene Nyoka Cowden, MD  Dala Dock, MD  Pre-operative Echo Findings: ? Severe aortic stenosis ? Moderate mitral stenosis ? Normalleft ventricular systolic function  Post-operative Echo Findings: ? Noparavalvular leak ? Normalleft ventricular systolic function   _____________   Echo 06/30/18 IMPRESSIONS 1. The left ventricle has normal systolic function with an ejection fraction of 60-65%. The cavity size was normal. There is severe concentric left ventricular hypertrophy. Left ventricular diastolic Doppler parameters are consistent with  pseudonormalization. Elevated left ventricular end-diastolic pressure. 2. The right ventricle has normal systolic function. The cavity was normal. There is no increase in right ventricular wall thickness. Right ventricular systolic pressure is moderately elevated with an estimated pressure of 44.2 mmHg. 3. Left atrial size was mildly dilated. 4. The mitral valve is degenerative. Severe thickening of the mitral valve leaflet. Moderate calcification of the mitral valve leaflet. There is moderate to severe mitral annular calcification present. Moderate mitral valve stenosis. MV Mean grad: 5.0  mmHg. 5. - TAVR: S/P Edwards Sapien 3 THV 55mm TAVR which appears well seated. There is no perivalvular AI. AV Area (VTI):1.02 cm. AV Mean Grad:19.2 mmHg. LVOT/AV VTI ratio:0.45. 6. Compared to prior report a TAVR is now present.  Labs/Other Tests and Data Reviewed:    EKG:  No ECG reviewed.  Recent Labs: 06/22/2018: ALT 34; B Natriuretic Peptide 447.3 06/28/2018: TSH 3.436 06/30/2018: BUN 7; Creatinine, Ser 0.63; Hemoglobin 10.5; Magnesium 1.3; Platelets 249; Potassium 4.1; Sodium 139   Recent Lipid Panel Lab Results  Component Value Date/Time   CHOL 176 06/25/2018 12:23 PM   TRIG 187 (H) 06/25/2018 12:23 PM   HDL 55 06/25/2018 12:23 PM   CHOLHDL 3.2 06/25/2018 12:23 PM   LDLCALC 84 06/25/2018 12:23 PM    Wt Readings from Last 3 Encounters:  07/29/18 190 lb 8 oz (86.4 kg)  07/07/18 191 lb (86.6 kg)  06/30/18 194 lb (88 kg)     Objective:    Vital Signs:  BP 117/63   Pulse 68   Wt 190 lb 8 oz (86.4 kg)   SpO2 96%   BMI 42.71 kg/m    Well nourished, well developed female in no acute distress.    ASSESSMENT & PLAN:    Severe AS s/p TAVR:doing great with NYHA class II symptoms. She has had a great deal of symptomatic improvement since TAVR. SBE prophylaxis discussed; the patient is edentulous and does not go to the dentist. Echo scheduled for next week. Continue aspirin and elquis. She can stop aspirin after 6 months (12/2018). I will see her back in 1 year with echo.    Chronic combined S/D CHF: post TAVR echo with normalized EF ~60%. Follow up echo next week. Continue lasix 30mg  daily.   PAF: sounds like she has been maintaining sinus. Has had infrequent palpitations. Plan to stop amiodarone now and see how she does. Continue Eliquis   Hx of prosthetic joint septic arthritis: continue doxy  HTN:BP well controlled. Continue current regimen   Pulmonary nodules: noted on pre TAVR CT scan. With 30 year pack history of smoking, she will need follow up CT scan in 12 months. This will be arranged at 1 year follow up.   COVID-19 Education: The signs and symptoms of COVID-19 were discussed with the patient and how to seek care for testing (follow up with PCP or arrange E-visit).  The importance of social distancing was discussed today.  Time:   Today, I have spent 25 minutes with the patient with telehealth technology discussing the above problems.     Medication Adjustments/Labs and Tests Ordered: Current medicines are reviewed at length with the patient today.  Concerns regarding medicines are outlined above.   Tests Ordered: Orders Placed This Encounter  Procedures  . CT Chest Wo Contrast    Medication Changes: Meds ordered this encounter  Medications  . furosemide (LASIX) 20 MG tablet    Sig: Take 1.5 tablets (30 mg total) by mouth daily.    Order Specific Question:   Supervising Provider    Answer:   Sherren Mocha [[id number]]    Disposition:  Follow up in 1 year(s) SHe would like to follow with our group moving forward. I told her to let Dr. Berenda Morale group know that  moving forward. She will see Dr. Sallyanne Kuster back in 4 months.   Signed, Angelena Form, PA-C  07/29/2018 3:38 PM    [facility]

## 2018-07-29 ENCOUNTER — Other Ambulatory Visit: Payer: Self-pay

## 2018-07-29 ENCOUNTER — Telehealth (INDEPENDENT_AMBULATORY_CARE_PROVIDER_SITE_OTHER): Payer: Medicare Other | Admitting: Physician Assistant

## 2018-07-29 ENCOUNTER — Encounter: Payer: Self-pay | Admitting: Physician Assistant

## 2018-07-29 VITALS — BP 117/63 | HR 68 | Wt 190.5 lb

## 2018-07-29 DIAGNOSIS — I10 Essential (primary) hypertension: Secondary | ICD-10-CM

## 2018-07-29 DIAGNOSIS — Z8739 Personal history of other diseases of the musculoskeletal system and connective tissue: Secondary | ICD-10-CM

## 2018-07-29 DIAGNOSIS — Z952 Presence of prosthetic heart valve: Secondary | ICD-10-CM | POA: Diagnosis not present

## 2018-07-29 DIAGNOSIS — R918 Other nonspecific abnormal finding of lung field: Secondary | ICD-10-CM

## 2018-07-29 DIAGNOSIS — I480 Paroxysmal atrial fibrillation: Secondary | ICD-10-CM

## 2018-07-29 DIAGNOSIS — I5032 Chronic diastolic (congestive) heart failure: Secondary | ICD-10-CM

## 2018-07-29 DIAGNOSIS — Z7189 Other specified counseling: Secondary | ICD-10-CM

## 2018-07-29 MED ORDER — FUROSEMIDE 20 MG PO TABS: 30.0000 mg | ORAL_TABLET | Freq: Every day | ORAL | Status: DC

## 2018-07-29 NOTE — Progress Notes (Signed)
Ty! MCr 

## 2018-07-29 NOTE — Patient Instructions (Addendum)
Medication Instructions:  1) STOP AMIODARONE 2) You may STOP ASPIRIN 12/29/2018.  Testing/Procedures: You will have a chest CT and echocardiogram on June 29, 2019. Please arrive at 1:15PM for these appointments.   Follow-Up: You have an appointment with Dr. Sallyanne Kuster on Tuesday, November 16, 2018 at 1:40PM.  You have your 1 year TAVR visits are scheduled 06/29/2019. Please arrive at 1:15PM for your CT, echocardiogram, and office visit with Nell Range.

## 2018-08-06 ENCOUNTER — Other Ambulatory Visit (HOSPITAL_COMMUNITY): Payer: Medicare Other

## 2018-08-10 ENCOUNTER — Telehealth: Payer: Self-pay | Admitting: Physician Assistant

## 2018-08-10 ENCOUNTER — Other Ambulatory Visit: Payer: Self-pay | Admitting: Physician Assistant

## 2018-08-10 DIAGNOSIS — R002 Palpitations: Secondary | ICD-10-CM

## 2018-08-10 MED ORDER — AMIODARONE HCL 200 MG PO TABS: 200.0000 mg | ORAL_TABLET | Freq: Every day | ORAL | 0 refills | Status: DC

## 2018-08-10 NOTE — Telephone Encounter (Signed)
Thank you :)

## 2018-08-10 NOTE — Telephone Encounter (Signed)
Talked with patient and daughter. When she has the palpitations, HRs have been up ~145bpm and she is very symptomatic. Baseline HR when in sinus is 50-60s. They plan to leave late tonight to go to [place] for 1 week. To get her through her vacation we will plan to call in amiodarone 200mg  daily (sent into local pharmacy in [place]). When she returns, will plan to have her stop amio and wear a 30 day event monitor. With COPD on home oxygen, I would like to avoid long term amio use. Will follow clinically and results of monitor. She has an office visit with Dr. Sallyanne Kuster in September.

## 2018-08-10 NOTE — Telephone Encounter (Signed)
Ms. Tanya [name] reports about 4-5 days after stopping amiodarone 5/21, her HR starting increasing in the afternoons/evenings. She has a pulse ox and monitors her HR frequently. Yesterday evening, Ms. Tanya [name] was lying in bed when she noticed her heart pounding. She also had some tightness at the base of her throat. This episode lasted about 40 minutes before her HR went back to normal. Currently, Ms. Tanya [name] is asymptomatic and her HR is normal (in the 70s).  She is going out of town tomorrow and would like new recommendations prior to that time so she can get medications if necessary.   To K. Grandville Silos for recommendations.

## 2018-08-10 NOTE — Telephone Encounter (Signed)
STAT if HR is under 50 or over 120 (normal HR is 60-100 beats per minute)  1) What is your heart rate? 146 was the  Highest last nigh and the lowest last night the lowest was 132- after about  40 minutes slowly it went back to 70 -this morning it is 79   Do you have a log of your heart rate readings (document readings)? No- but checks it several times  A day 2) Do you have any other symptoms?   no

## 2018-08-13 ENCOUNTER — Telehealth (HOSPITAL_COMMUNITY): Payer: Self-pay | Admitting: *Deleted

## 2018-08-13 NOTE — Telephone Encounter (Signed)
Not able to leave message, call will not go to voicemail on home (rings and then beeps/disconnects) or cell (call cannot be completed) Also no DPR  [facility]

## 2018-08-16 ENCOUNTER — Ambulatory Visit (HOSPITAL_COMMUNITY): Payer: Medicare Other | Attending: Cardiology

## 2018-08-16 ENCOUNTER — Other Ambulatory Visit: Payer: Self-pay

## 2018-08-16 DIAGNOSIS — Z952 Presence of prosthetic heart valve: Secondary | ICD-10-CM | POA: Insufficient documentation

## 2018-08-19 ENCOUNTER — Telehealth: Payer: Self-pay

## 2018-08-19 ENCOUNTER — Telehealth: Payer: Self-pay | Admitting: Radiology

## 2018-08-19 NOTE — Telephone Encounter (Signed)
-----   Message from Benita Stabile sent at 08/19/2018 11:31 AM EDT ----- Regarding: RE: event monitor Just spoke to this lady and im about to order her monitor. She was asking a lot of questions about her amio. It looks like yall wanted to stop it when she got back from vacation. She is still taking it as of this am But I told her I would get the nurse to look at it and confirm.  Thanks ----- Message ----- From: Theodoro Parma, RN Sent: 08/18/2018   5:15 PM EDT To: Benita Stabile, Theodoro Parma, RN Subject: event monitor                                  Hello! Event monitor ordered. Can we mail it?  Thank you!

## 2018-08-19 NOTE — Telephone Encounter (Signed)
Enrolled patient for a 30 day [employer] Event Monitor to be mailed. Brief instructions were gone over and patient knows to expect the monitor to arrive in 3-4 days

## 2018-08-19 NOTE — Telephone Encounter (Signed)
Reiterated to the patient she needs to STOP AMIODARONE. Confirmed with monitor technician that monitor was mailed.

## 2018-08-26 ENCOUNTER — Telehealth: Payer: Self-pay | Admitting: Physician Assistant

## 2018-08-26 NOTE — Telephone Encounter (Signed)
Returned patients call she is confused on how to put her monitor on. Her grand daughter is Tanya [name] come help her apply it later today. I reminded her she can call [employer] and they can walk her though applying the monitor also. She thanked me for my call and will call me back if she has further difficulties.

## 2018-08-26 NOTE — Telephone Encounter (Signed)
Patient has received her monitor but would like some help to figure out how to put it on. Is asking if she can come in

## 2018-08-27 ENCOUNTER — Ambulatory Visit (INDEPENDENT_AMBULATORY_CARE_PROVIDER_SITE_OTHER): Payer: Medicare Other

## 2018-08-27 DIAGNOSIS — R002 Palpitations: Secondary | ICD-10-CM

## 2018-08-31 ENCOUNTER — Encounter: Payer: Self-pay | Admitting: Thoracic Surgery (Cardiothoracic Vascular Surgery)

## 2018-09-01 ENCOUNTER — Encounter: Payer: Self-pay | Admitting: Thoracic Surgery (Cardiothoracic Vascular Surgery)

## 2018-09-02 ENCOUNTER — Other Ambulatory Visit (HOSPITAL_COMMUNITY): Payer: Medicare Other

## 2018-09-03 ENCOUNTER — Telehealth (HOSPITAL_COMMUNITY): Payer: Self-pay

## 2018-09-03 NOTE — Telephone Encounter (Signed)
No response from pt, closed referral. °

## 2018-09-21 ENCOUNTER — Encounter: Payer: Self-pay | Admitting: Cardiovascular Disease

## 2018-09-22 ENCOUNTER — Telehealth: Payer: Self-pay | Admitting: *Deleted

## 2018-09-22 NOTE — Telephone Encounter (Signed)
Cardiac clearance has been successfully faxed to [phone] to Dr. Arvin Collard. The patient is a low-to-moderate risk from a cardiac standpoint. Hold Eliquis 2 days prior to the hernia procedure.

## 2018-10-05 ENCOUNTER — Other Ambulatory Visit: Payer: Self-pay

## 2018-10-11 ENCOUNTER — Other Ambulatory Visit (HOSPITAL_COMMUNITY): Payer: Medicare Other

## 2018-11-12 ENCOUNTER — Encounter: Payer: Self-pay | Admitting: Cardiovascular Disease

## 2018-11-16 ENCOUNTER — Other Ambulatory Visit: Payer: Self-pay

## 2018-11-16 ENCOUNTER — Encounter: Payer: Self-pay | Admitting: Cardiovascular Disease

## 2018-11-16 ENCOUNTER — Ambulatory Visit (INDEPENDENT_AMBULATORY_CARE_PROVIDER_SITE_OTHER): Payer: Medicare Other | Admitting: Cardiovascular Disease

## 2018-11-16 VITALS — BP 132/62 | HR 74 | Ht <= 58 in | Wt 187.0 lb

## 2018-11-16 DIAGNOSIS — I480 Paroxysmal atrial fibrillation: Secondary | ICD-10-CM | POA: Diagnosis not present

## 2018-11-16 DIAGNOSIS — I5032 Chronic diastolic (congestive) heart failure: Secondary | ICD-10-CM

## 2018-11-16 DIAGNOSIS — G4733 Obstructive sleep apnea (adult) (pediatric): Secondary | ICD-10-CM

## 2018-11-16 DIAGNOSIS — I447 Left bundle-branch block, unspecified: Secondary | ICD-10-CM | POA: Diagnosis not present

## 2018-11-16 DIAGNOSIS — Z952 Presence of prosthetic heart valve: Secondary | ICD-10-CM | POA: Diagnosis not present

## 2018-11-16 DIAGNOSIS — J449 Chronic obstructive pulmonary disease, unspecified: Secondary | ICD-10-CM

## 2018-11-16 DIAGNOSIS — I342 Nonrheumatic mitral (valve) stenosis: Secondary | ICD-10-CM | POA: Diagnosis not present

## 2018-11-16 DIAGNOSIS — I10 Essential (primary) hypertension: Secondary | ICD-10-CM

## 2018-11-16 MED ORDER — FUROSEMIDE 20 MG PO TABS: 20.0000 mg | ORAL_TABLET | Freq: Every day | ORAL | Status: DC

## 2018-11-16 NOTE — Progress Notes (Signed)
Cardiology Office Note:    Date:  11/20/2018   ID:  Tanya [name], DOB 1945-12-18, MRN [id number]  PCP:  System, Pcp Not In  Cardiologist:  Sanda Klein, MD  Electrophysiologist:  None   Referring MD: No ref. provider found   Chief Complaint  Patient presents with  . Other    3-4 month follow up. Patient c/o some chest tightness and SOB. Meds reviewed verbally with patient.     History of Present Illness:    Tanya [name] is a 73 y.o. female with a hx of severe AS s/p TAVR (April 2020, 23 mm Edwards Sapien 3), paroxysmal atrial fibrillation, Eliquis anticoagulation, moderate degenerative mitral stenosis and insufficiency, morbid obesity, HTN, DM, OSA, COPD on O2 2l at night, LBBB, chronic doxycycline therapy for septic arthritis, s/p L mastectomy with residual lymphedema.  Functional status is markedly improved since TAVR.  Echo in June showed a mean TAVR gradient of 14 mm Hg, normal LV function. A 30 day event monitor was normal except for PACs. She did not have CAD at angiography pre-TAVR.  The patient specifically denies any chest pain at rest exertion, dyspnea at rest, orthopnea, paroxysmal nocturnal dyspnea, syncope, palpitations, focal neurological deficits, intermittent claudication, lower extremity edema, unexplained weight gain, cough, hemoptysis or wheezing.   Past Medical History:  Diagnosis Date  . Asthma   . Breast cancer in female [facility])    1994 s/p chemo and radical L mastectomy  . Diabetes ([place])   . DVT (deep venous thrombosis) (HCC)    in shoulder after shoulder replacement  . Full dentures   . GERD (gastroesophageal reflux disease)   . Gout   . H/O total shoulder replacement   . History of knee replacement, total, bilateral   . 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 now on daily doxycycline.  Marland Kitchen HTN (hypertension)   . Lymphedema of left arm    s/p mastectomy with lymph node removal for breast cancer   . Morbid obesity ([place])   . Neuropathy   . OSA on CPAP   . Ovarian cancer ([place])    2012 s/p chemo and radical hysterectomy  . PAF (paroxysmal atrial fibrillation) (HCC)    on Eliquis  . Pulmonary nodules    seen on pre TAVR CT scan. needs follow up CT in 06/2019  . S/P TAVR (transcatheter aortic valve replacement)    23 mm Edwards Sapien 3 transcatheter heart valve placed via percutaneous right transfemoral approach   . Severe aortic stenosis   . Urine incontinence     Past Surgical History:  Procedure Laterality Date  . MASTECTOMY Left 1993  . RIGHT/LEFT HEART CATH AND CORONARY ANGIOGRAPHY N/A 06/25/2018   Procedure: RIGHT/LEFT HEART CATH AND CORONARY ANGIOGRAPHY;  Surgeon: Belva Crome, MD;  Location: [place] [facility];  Service: Cardiovascular;  Laterality: N/A;  . TEE WITHOUT CARDIOVERSION N/A 06/29/2018   Procedure: TRANSESOPHAGEAL ECHOCARDIOGRAM (TEE);  Surgeon: Sherren Mocha, MD;  Location: [place];  Service: Open Heart Surgery;  Laterality: N/A;  . TRANSCATHETER AORTIC VALVE REPLACEMENT, TRANSFEMORAL N/A 06/29/2018   Procedure: TRANSCATHETER AORTIC VALVE REPLACEMENT, TRANSFEMORAL;  Surgeon: Sherren Mocha, MD;  Location: [place];  Service: Open Heart Surgery;  Laterality: N/A;    Current Medications: Current Meds  Medication Sig  . acetaminophen (TYLENOL) 325 MG tablet Take 650 mg by mouth every 6 (six) hours as needed for mild pain or fever.  Marland Kitchen apixaban (ELIQUIS) 5  MG TABS tablet Take 5 mg by mouth 2 (two) times daily.  Marland Kitchen aspirin 81 MG chewable tablet Chew 1 tablet (81 mg total) by mouth daily.  Marland Kitchen atorvastatin (LIPITOR) 40 MG tablet Take 40 mg by mouth daily.  Marland Kitchen azelastine (ASTELIN) 0.1 % nasal spray Place 1 spray into both nostrils 2 (two) times daily. Use in each nostril as directed  . calcium carbonate (OS-CAL - DOSED IN MG OF ELEMENTAL CALCIUM) 1250 (500 Ca) MG tablet Take 1 tablet by mouth 2 (two) times daily with a meal.  . doxycycline (VIBRA-TABS) 100 MG tablet Take  100 mg by mouth 2 (two) times daily.  . DULoxetine (CYMBALTA) 30 MG capsule Take 30 mg by mouth 2 (two) times daily.  Marland Kitchen esomeprazole (NEXIUM) 40 MG capsule Take 40 mg by mouth daily at 12 noon.  . fluticasone furoate-vilanterol (BREO ELLIPTA) 200-25 MCG/INH AEPB Inhale 1 puff into the lungs daily.  . furosemide (LASIX) 20 MG tablet Take 1 tablet (20 mg total) by mouth daily.  . metFORMIN (GLUCOPHAGE) 500 MG tablet Take 1,000 mg by mouth 2 (two) times daily with a meal.  . metoprolol succinate (TOPROL-XL) 25 MG 24 hr tablet Take 25 mg by mouth daily.  . montelukast (SINGULAIR) 10 MG tablet Take 10 mg by mouth at bedtime.  . Multiple Vitamin (MULTIVITAMIN WITH MINERALS) TABS tablet Take 1 tablet by mouth daily.  Marland Kitchen oxybutynin (DITROPAN-XL) 10 MG 24 hr tablet Take 10 mg by mouth at bedtime.  . potassium chloride (K-DUR) 10 MEQ tablet Take 1 tablet (10 mEq total) by mouth daily.  . sitaGLIPtin (JANUVIA) 25 MG tablet Take 25 mg by mouth daily.  . vitamin B-12 (CYANOCOBALAMIN) 100 MCG tablet Take 100 mcg by mouth daily.  . vitamin E 100 UNIT capsule Take 100 Units by mouth daily.  . [DISCONTINUED] furosemide (LASIX) 20 MG tablet Take 1.5 tablets (30 mg total) by mouth daily.     Allergies:   Pneumovax [pneumococcal polysaccharide vaccine], Sulfa antibiotics, and Levaquin [levofloxacin]   Social History   Socioeconomic History  . Marital status: Single    Spouse name: Not on file  . Number of children: Not on file  . Years of education: Not on file  . Highest education level: Not on file  Occupational History  . Not on file  Social Needs  . Financial resource strain: Not on file  . Food insecurity    Worry: Not on file    Inability: Not on file  . Transportation needs    Medical: Not on file    Non-medical: Not on file  Tobacco Use  . Smoking status: Former Smoker    Types: Cigarettes  . Smokeless tobacco: Never Used  Substance and Sexual Activity  . Alcohol use: Never     Frequency: Never  . Drug use: Never  . Sexual activity: Not on file  Lifestyle  . Physical activity    Days per week: Not on file    Minutes per session: Not on file  . Stress: Not on file  Relationships  . Social Herbalist on phone: Not on file    Gets together: Not on file    Attends religious service: Not on file    Active member of club or organization: Not on file    Attends meetings of clubs or organizations: Not on file    Relationship status: Not on file  Other Topics Concern  . Not on file  Social History Narrative  Divorced many years ago, recently moved in with daughter     Family History: The patient's family history includes Heart disease in her father; Hypertension in her mother; Renal cancer in her brother.  ROS:   Please see the history of present illness.     All other systems reviewed and are negative.  EKGs/Labs/Other Studies Reviewed:    The following studies were reviewed today: ECHO 08/16/2018  1. The left ventricle has normal systolic function, with an ejection fraction of 55-60%. The cavity size was normal. Left ventricular diastolic Doppler parameters are consistent with pseudonormalization. Elevated left ventricular end-diastolic pressure.  2. The average left ventricular global longitudinal strain is 14.8 %.  3. The right ventricle has normal systolic function. The cavity was mildly enlarged. There is no increase in right ventricular wall thickness. Right ventricular systolic pressure is moderately elevated with an estimated pressure of 46.0 mmHg.  4. Left atrial size was mildly dilated.  5. The mitral valve is degenerative. Severe thickening of the mitral valve leaflet. There is severe mitral annular calcification present. Mild mitral valve stenosis.  6. - TAVR: Stable 65mm Edwards Sapien 3 THV that is well seated with no perivalvular AI. The peak velocity is 243 cm/s, AV mean gradient 14mmHg and AVA 1.21cm2 by VTI. LVOT/AV VTI ratio:0.48.   30 day event monitor  Normal sinus rhythm with normal circadian variation.  Rare isolated premature atrial beats are seen.  No significant ventricular arrhythmia, atrial fibrillation or bradycardia.  One recorded "pause" appears to be artifactual due to undersensing related to a change in QRS morphology.  Multiple symptom driven recordings are presented, all show normal rhythm.   Normal 30 day event monitor. There is no correlation between the rhythm and symptoms.  EKG:  EKG is ordered today.  The ekg ordered today demonstrates NSR, LBBB  Recent Labs: 06/22/2018: ALT 34; B Natriuretic Peptide 447.3 06/28/2018: TSH 3.436 06/30/2018: BUN 7; Creatinine, Ser 0.63; Hemoglobin 10.5; Magnesium 1.3; Platelets 249; Potassium 4.1; Sodium 139  Recent Lipid Panel    Component Value Date/Time   CHOL 176 06/25/2018 1223   TRIG 187 (H) 06/25/2018 1223   HDL 55 06/25/2018 1223   CHOLHDL 3.2 06/25/2018 1223   VLDL 37 06/25/2018 1223   LDLCALC 84 06/25/2018 1223    Physical Exam:    VS:  BP 132/62 (BP Location: Right Arm, Patient Position: Sitting, Cuff Size: Normal)   Pulse 74   Ht 4\' 8"  (1.422 m)   Wt 187 lb (84.8 kg)   BMI 41.92 kg/m     Wt Readings from Last 3 Encounters:  11/16/18 187 lb (84.8 kg)  07/29/18 190 lb 8 oz (86.4 kg)  07/07/18 191 lb (86.6 kg)     GEN: morbidly obese, Well nourished, well developed in no acute distress HEENT: Normal NECK: No JVD; No carotid bruits LYMPHATICS: No lymphadenopathy CARDIAC: RRR, 1/6 RUSB early peaking ejection murmur, no diastolic murmurs, rubs, gallops RESPIRATORY:  Clear to auscultation without rales, wheezing or rhonchi  ABDOMEN: Soft, non-tender, non-distended MUSCULOSKELETAL:  L arm lymphedema; No deformity  SKIN: Warm and dry NEUROLOGIC:  Alert and oriented x 3 PSYCHIATRIC:  Normal affect   ASSESSMENT:    1. S/P TAVR (transcatheter aortic valve replacement)   2. Chronic diastolic heart failure (HCC)   3. Paroxysmal  atrial fibrillation ([place])   4. Mitral valve stenosis, non-rheumatic   5. OSA (obstructive sleep apnea)   6. Chronic obstructive pulmonary disease, unspecified COPD type ([place])   7. Essential  hypertension   8. LBBB (left bundle branch block)    PLAN:    In order of problems listed above:  1. AS s/p TAVR: excellent clinical response. She is on chronic doxycycline therapy for septic arthritis. Discussed the need for additional antibiotic prophylaxis with dental procedures. Scheduled for f/u echo next April. 2. CHF: appears euvolemic. Probably needs less diuretic since improved LVEF, will decrease the dose 3. Parox AFib: occurred after TAVR, w RVR, no clinical events since. No longer on amiodarone, on beta blockers. 4. MS: avoid tachycardia. 5. OSA/COPD: dilated PA c/w PAH, on CPAP and home O2. 6. HTN: controlled. 7. LBBB: no evidence of higher grade AV block on 30 day monitor   Medication Adjustments/Labs and Tests Ordered: Current medicines are reviewed at length with the patient today.  Concerns regarding medicines are outlined above.  Orders Placed This Encounter  Procedures  . EKG 12-Lead   Meds ordered this encounter  Medications  . furosemide (LASIX) 20 MG tablet    Sig: Take 1 tablet (20 mg total) by mouth daily.    Dispense:  30 tablet    Patient Instructions  Medication Instructions:  DECREASE the Furosemide to 20 mg once daily If you need a refill on your cardiac medications before your next appointment, please call your pharmacy.   Lab work: None ordered If you have labs (blood work) drawn today and your tests are completely normal, you will receive your results only by: Marland Kitchen MyChart Message (if you have MyChart) OR . A paper copy in the mail If you have any lab test that is abnormal or we need to change your treatment, we will call you to review the results.  Testing/Procedures: None ordered  Follow-Up: At [facility], you and your health needs are our  priority.  As part of our continuing mission to provide you with exceptional heart care, we have created designated Provider Care Teams.  These Care Teams include your primary Cardiologist (physician) and Advanced Practice Providers (APPs -  Physician Assistants and Nurse Practitioners) who all work together to provide you with the care you need, when you need it. You will need a follow up appointment in 12 months.  Please call our office 2 months in advance to schedule this appointment.  You may see Sanda Klein, MD or one of the following Advanced Practice Providers on your designated Care Team: [place], [place] . Fabian Sharp, PA-C     Signed, Sanda Klein, MD  11/20/2018 6:01 PM    [facility]

## 2018-11-16 NOTE — Patient Instructions (Signed)
Medication Instructions:  DECREASE the Furosemide to 20 mg once daily If you need a refill on your cardiac medications before your next appointment, please call your pharmacy.   Lab work: None ordered If you have labs (blood work) drawn today and your tests are completely normal, you will receive your results only by: Marland Kitchen MyChart Message (if you have MyChart) OR . A paper copy in the mail If you have any lab test that is abnormal or we need to change your treatment, we will call you to review the results.  Testing/Procedures: None ordered  Follow-Up: At [facility], you and your health needs are our priority.  As part of our continuing mission to provide you with exceptional heart care, we have created designated Provider Care Teams.  These Care Teams include your primary Cardiologist (physician) and Advanced Practice Providers (APPs -  Physician Assistants and Nurse Practitioners) who all work together to provide you with the care you need, when you need it. You will need a follow up appointment in 12 months.  Please call our office 2 months in advance to schedule this appointment.  You may see Sanda Klein, MD or one of the following Advanced Practice Providers on your designated Care Team: [place], [place] . Fabian Sharp, PA-C

## 2018-11-17 ENCOUNTER — Other Ambulatory Visit: Payer: Self-pay

## 2018-11-17 DIAGNOSIS — I5032 Chronic diastolic (congestive) heart failure: Secondary | ICD-10-CM

## 2018-11-17 DIAGNOSIS — R002 Palpitations: Secondary | ICD-10-CM

## 2018-11-17 DIAGNOSIS — Z952 Presence of prosthetic heart valve: Secondary | ICD-10-CM

## 2018-11-17 DIAGNOSIS — I447 Left bundle-branch block, unspecified: Secondary | ICD-10-CM

## 2018-11-17 DIAGNOSIS — I480 Paroxysmal atrial fibrillation: Secondary | ICD-10-CM

## 2018-12-16 ENCOUNTER — Other Ambulatory Visit: Payer: Self-pay | Admitting: Physician Assistant

## 2018-12-16 NOTE — Telephone Encounter (Signed)
This is Dr. Croitoru's pt 

## 2018-12-16 NOTE — Telephone Encounter (Signed)
Rx request sent to pharmacy.  

## 2019-06-27 NOTE — Progress Notes (Deleted)
[facility] [place]                                       Cardiology Office Note    Date:  06/28/2019   ID:  Tasheia Gile [name], DOB [birth year], MRN [id number]  PCP:  System, Pcp Not In  Cardiologist: Sanda Klein, MD/Dr. Burt Knack & Dr. Roxy Manns ( TAVR)  CC: 1 year s/p TAVR  History of Present Illness:  Tanya [name] is a 74 y.o. 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  She was admitted 4/14-4/22/20 foracute on chronichypoxic and hypercapnicrespiratory failure in the setting ofacute CHF 2/2severe aortic stenosis and moderate mitral stenosis/regurgitation.EF down to 40-45%. Sheruled out for [employer]. She improved quickly with IV diuresis.She was able to be extubated the following day and placed on Bipapand then transitioned to HFNC.She was seen by the structural heart team.Pre TAVRL/RHC showed widely patent coronary arteries. She underwentsuccessful TAVR with a 58mm Edwards Sapien 3 THV via the TF approach on 06/29/18.Her hosptital course was c/b afib with RVR. She was treated with amiodarone.Post op echo showed EF 65% with mildly elevated gradients (mean 38mm Hg) and no PVL.She was discharged on aspirin and eliquis.  She was later taken off amiodarone but had return of palpitations. 30 day monitor in 09/2018 was essentially normal. There were multiplesymptom driven recordings are presented, all show normal rhythm.   Toady she presents to clinic for follow up.      Past Medical History:  Diagnosis Date  . Asthma   . Breast cancer in female [facility])    1994 s/p chemo and radical L mastectomy  . Diabetes ([place])     . DVT (deep venous thrombosis) (HCC)    in shoulder after shoulder replacement  . Full dentures   . GERD (gastroesophageal reflux disease)   . Gout   . H/O total shoulder replacement   . History of knee replacement, total, bilateral   . 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 now on daily doxycycline.  Marland Kitchen HTN (hypertension)   . Lymphedema of left arm    s/p mastectomy with lymph node removal for breast cancer  . Morbid obesity ([place])   . Neuropathy   . OSA on CPAP   . Ovarian cancer ([place])    2012 s/p chemo and radical hysterectomy  . PAF (paroxysmal atrial fibrillation) (HCC)    on Eliquis  . Pulmonary nodules    seen on pre TAVR CT scan. needs follow up CT in 06/2019  . S/P TAVR (transcatheter aortic valve replacement)    23 mm Edwards Sapien 3 transcatheter heart valve placed via percutaneous right transfemoral approach   . Severe aortic stenosis   . Urine incontinence     Past Surgical History:  Procedure Laterality Date  . MASTECTOMY Left 1993  . RIGHT/LEFT HEART CATH AND CORONARY ANGIOGRAPHY N/A 06/25/2018   Procedure: RIGHT/LEFT HEART CATH AND CORONARY ANGIOGRAPHY;  Surgeon: Belva Crome, MD;  Location: [place] [facility];  Service: Cardiovascular;  Laterality: N/A;  . TEE WITHOUT CARDIOVERSION N/A 06/29/2018  Procedure: TRANSESOPHAGEAL ECHOCARDIOGRAM (TEE);  Surgeon: Sherren Mocha, MD;  Location: [place];  Service: Open Heart Surgery;  Laterality: N/A;  . TRANSCATHETER AORTIC VALVE REPLACEMENT, TRANSFEMORAL N/A 06/29/2018   Procedure: TRANSCATHETER AORTIC VALVE REPLACEMENT, TRANSFEMORAL;  Surgeon: Sherren Mocha, MD;  Location: [place];  Service: Open Heart Surgery;  Laterality: N/A;    Current Medications: Outpatient Medications Prior to Visit  Medication Sig Dispense Refill  . acetaminophen (TYLENOL) 325 MG tablet Take 650 mg by mouth every 6 (six) hours as needed for mild pain or fever.    Marland Kitchen apixaban (ELIQUIS) 5  MG TABS tablet Take 5 mg by mouth 2 (two) times daily.    Marland Kitchen aspirin 81 MG chewable tablet Chew 1 tablet (81 mg total) by mouth daily.    Marland Kitchen atorvastatin (LIPITOR) 40 MG tablet Take 40 mg by mouth daily.    Marland Kitchen azelastine (ASTELIN) 0.1 % nasal spray Place 1 spray into both nostrils 2 (two) times daily. Use in each nostril as directed    . calcium carbonate (OS-CAL - DOSED IN MG OF ELEMENTAL CALCIUM) 1250 (500 Ca) MG tablet Take 1 tablet by mouth 2 (two) times daily with a meal.    . doxycycline (VIBRA-TABS) 100 MG tablet Take 100 mg by mouth 2 (two) times daily.    . DULoxetine (CYMBALTA) 30 MG capsule Take 30 mg by mouth 2 (two) times daily.    Marland Kitchen esomeprazole (NEXIUM) 40 MG capsule Take 40 mg by mouth daily at 12 noon.    . fluticasone furoate-vilanterol (BREO ELLIPTA) 200-25 MCG/INH AEPB Inhale 1 puff into the lungs daily.    . furosemide (LASIX) 20 MG tablet Take 1 tablet (20 mg total) by mouth daily. 30 tablet   . metFORMIN (GLUCOPHAGE) 500 MG tablet Take 1,000 mg by mouth 2 (two) times daily with a meal.    . metoprolol succinate (TOPROL-XL) 25 MG 24 hr tablet Take 25 mg by mouth daily.    . montelukast (SINGULAIR) 10 MG tablet Take 10 mg by mouth at bedtime.    . Multiple Vitamin (MULTIVITAMIN WITH MINERALS) TABS tablet Take 1 tablet by mouth daily.    Marland Kitchen oxybutynin (DITROPAN-XL) 10 MG 24 hr tablet Take 10 mg by mouth at bedtime.    . potassium chloride (KLOR-CON) 10 MEQ tablet Take 1 tablet (10 mEq total) by mouth daily. 90 tablet 1  . sitaGLIPtin (JANUVIA) 25 MG tablet Take 25 mg by mouth daily.    . vitamin B-12 (CYANOCOBALAMIN) 100 MCG tablet Take 100 mcg by mouth daily.    . vitamin E 100 UNIT capsule Take 100 Units by mouth daily.     No facility-administered medications prior to visit.     Allergies:   Pneumovax [pneumococcal polysaccharide vaccine], Sulfa antibiotics, and Levaquin [levofloxacin]   Social History   Socioeconomic History  . Marital status: Single    Spouse name:  Not on file  . Number of children: Not on file  . Years of education: Not on file  . Highest education level: Not on file  Occupational History  . Not on file  Tobacco Use  . Smoking status: Former Smoker    Types: Cigarettes  . Smokeless tobacco: Never Used  Substance and Sexual Activity  . Alcohol use: Never  . Drug use: Never  . Sexual activity: Not on file  Other Topics Concern  . Not on file  Social History Narrative   Divorced many years ago, recently moved in with daughter   Social Determinants  of Health   Financial Resource Strain:   . Difficulty of Paying Living Expenses:   Food Insecurity:   . Worried About Charity fundraiser in the Last Year:   . Arboriculturist in the Last Year:   Transportation Needs:   . Film/video editor (Medical):   Marland Kitchen Lack of Transportation (Non-Medical):   Physical Activity:   . Days of Exercise per Week:   . Minutes of Exercise per Session:   Stress:   . Feeling of Stress :   Social Connections:   . Frequency of Communication with Friends and Family:   . Frequency of Social Gatherings with Friends and Family:   . Attends Religious Services:   . Active Member of Clubs or Organizations:   . Attends Archivist Meetings:   Marland Kitchen Marital Status:      Family History:  The patient's ***family history includes Heart disease in her father; Hypertension in her mother; Renal cancer in her brother.     ROS:   Please see the history of present illness.    ROS All other systems reviewed and are negative.   PHYSICAL EXAM:   VS:  There were no vitals taken for this visit.   GEN: Well nourished, well developed, in no acute distress HEENT: normal Neck: no JVD or masses Cardiac: ***RRR; no murmurs, rubs, or gallops,no edema  Respiratory:  clear to auscultation bilaterally, normal work of breathing GI: soft, nontender, nondistended, + BS MS: no deformity or atrophy Skin: warm and dry, no rash Neuro:  Alert and Oriented x 3,  Strength and sensation are intact Psych: euthymic mood, full affect   Wt Readings from Last 3 Encounters:  11/16/18 187 lb (84.8 kg)  07/29/18 190 lb 8 oz (86.4 kg)  07/07/18 191 lb (86.6 kg)      Studies/Labs Reviewed:   EKG:  EKG is*** ordered today.  The ekg ordered today demonstrates ***  Recent Labs: 06/30/2018: BUN 7; Creatinine, Ser 0.63; Hemoglobin 10.5; Magnesium 1.3; Platelets 249; Potassium 4.1; Sodium 139   Lipid Panel    Component Value Date/Time   CHOL 176 06/25/2018 1223   TRIG 187 (H) 06/25/2018 1223   HDL 55 06/25/2018 1223   CHOLHDL 3.2 06/25/2018 1223   VLDL 37 06/25/2018 1223   LDLCALC 84 06/25/2018 1223    Additional studies/ records that were reviewed today include:  TAVR OPERATIVE NOTE   Date of Procedure:06/29/2018  Preoperative Diagnosis:Severe Aortic Stenosis   Postoperative Diagnosis:Same   Procedure:   Transcatheter Aortic Valve Replacement - PercutaneousRightTransfemoral Approach Edwards Sapien 3 THV (size 50mm, model # 9600TFX, serial # [id number])  Co-Surgeons:Clarence H. Roxy Manns, MD and Sherren Mocha, MD  Anesthesiologist:Charlene Nyoka Cowden, MD  Dala Dock, MD  Pre-operative Echo Findings: ? Severe aortic stenosis ? Moderate mitral stenosis ? Normalleft ventricular systolic function  Post-operative Echo Findings: ? Noparavalvular leak ? Normalleft ventricular systolic function  _____________   Echo 06/30/18 IMPRESSIONS 1. The left ventricle has normal systolic function with an ejection fraction of 60-65%. The cavity size was normal. There is severe concentric left ventricular hypertrophy. Left ventricular diastolic Doppler parameters are consistent with  pseudonormalization. Elevated left ventricular end-diastolic pressure. 2. The right ventricle has normal systolic function. The cavity  was normal. There is no increase in right ventricular wall thickness. Right ventricular systolic pressure is moderately elevated with an estimated pressure of 44.2 mmHg. 3. Left atrial size was mildly dilated. 4. The mitral valve is degenerative. Severe thickening of the  mitral valve leaflet. Moderate calcification of the mitral valve leaflet. There is moderate to severe mitral annular calcification present. Moderate mitral valve stenosis. MV Mean grad: 5.0  mmHg. 5. - TAVR: S/P Edwards Sapien 3 THV 9mm TAVR which appears well seated. There is no perivalvular AI. AV Area (VTI):1.02 cm. AV Mean Grad:19.2 mmHg. LVOT/AV VTI ratio:0.45. 6. Compared to prior report a TAVR is now present.  ________________  Echo 06/29/19 ***   ________________  CT 06/29/19 ***   ASSESSMENT & PLAN:   Severe ASs/p TAVR:   Chronic combined S/D CHF:  PAF:  Hx of prosthetic joint septic arthritis: continue doxy  HTN:BP well controlled. Continue current regimen   Pulmonary nodules: noted on pre TAVR CT scan. With 30 year pack history of smoking, she will need follow up CT scan in 12 months.CT today showed ***    Medication Adjustments/Labs and Tests Ordered: Current medicines are reviewed at length with the patient today.  Concerns regarding medicines are outlined above.  Medication changes, Labs and Tests ordered today are listed in the Patient Instructions below. There are no Patient Instructions on file for this visit.   Signed, Angelena Form, PA-C  06/28/2019 10:17 AM    [facility] [place], [place], [place]  [address] Phone: [phone]; Fax: [phone]

## 2019-06-29 ENCOUNTER — Ambulatory Visit (HOSPITAL_COMMUNITY): Payer: Medicare Other | Attending: Cardiology

## 2019-06-29 ENCOUNTER — Ambulatory Visit: Payer: Medicare Other | Admitting: Physician Assistant

## 2019-06-29 ENCOUNTER — Inpatient Hospital Stay: Admission: RE | Admit: 2019-06-29 | Payer: Medicare Other | Source: Ambulatory Visit

## 2019-06-29 ENCOUNTER — Other Ambulatory Visit: Payer: Self-pay

## 2019-06-29 DIAGNOSIS — I480 Paroxysmal atrial fibrillation: Secondary | ICD-10-CM

## 2019-06-29 DIAGNOSIS — Z952 Presence of prosthetic heart valve: Secondary | ICD-10-CM

## 2019-06-29 DIAGNOSIS — I5042 Chronic combined systolic (congestive) and diastolic (congestive) heart failure: Secondary | ICD-10-CM

## 2019-06-29 DIAGNOSIS — R918 Other nonspecific abnormal finding of lung field: Secondary | ICD-10-CM

## 2019-06-29 DIAGNOSIS — Z8739 Personal history of other diseases of the musculoskeletal system and connective tissue: Secondary | ICD-10-CM

## 2019-06-29 DIAGNOSIS — I10 Essential (primary) hypertension: Secondary | ICD-10-CM

## 2019-06-29 NOTE — Progress Notes (Signed)
[facility]   MULTIDISCIPLINARY HEART VALVE TEAM    Virtual Visit via Video Note   This visit type was conducted due to national recommendations for restrictions regarding the COVID-19 Pandemic (e.g. social distancing) in an effort to limit this patient's exposure and mitigate transmission in our community.  Due to her co-morbid illnesses, this patient is at least at moderate risk for complications without adequate follow up.  This format is felt to be most appropriate for this patient at this time.  All issues noted in this document were discussed and addressed.  A limited physical exam was performed with this format.  Please refer to the patient's chart for her consent to telehealth for [facility].   The patient was identified using 2 identifiers.  Date:  07/06/2019   ID:  Tanya [name], DOB 11-Aug-1945, MRN [id number]  Patient Location: Home Provider Location: Office  PCP:  Zara Chess, NP  Cardiologist:  Sanda Klein, MD / Dr. Burt Knack & Dr. Roxy Manns (TAVR) Electrophysiologist:  None   Evaluation Performed:  Follow-Up Visit  Chief Complaint:  1 year s/p TAVR  History of Present Illness:    Tanya [name] is a 74 y.o. 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  She was admitted 4/14-4/22/20 foracute on chronichypoxic and hypercapnicrespiratory failure in the setting ofacute CHF 2/2severe aortic stenosis and moderate mitral stenosis/regurgitation.EF down to 40-45%. Sheruled out for [employer]. She improved quickly with IV diuresis.She was able to be extubated the following day and placed on Bipapand then transitioned to HFNC.She was seen by the  structural heart team.Pre TAVRL/RHC showed widely patent coronary arteries. She underwentsuccessful TAVR with a 85mm Edwards Sapien 3 THV via the TF approach on 06/29/18.Her hosptital course was c/b afib with RVR. She was treated with amiodarone.Post op echo showed EF 65% with mildly elevated gradients (mean 63mm Hg) and no PVL.She was discharged on aspirin and eliquis.  She was later taken off amiodarone but had return of palpitations. 30 day monitor in 09/2018 was essentially normal. There were multiplesymptom driven recordings are presented, all show normal rhythm.   Toady she presents to clinic for follow up. The patient does not have symptoms concerning for COVID-19 infection (fever, chills, cough, or new shortness of breath). She continues to have dyspnea on exertion although it;s much improved since TAVR. She occasionally gets some chest pressure but not associated with exertion. No LE edema, orthopnea or PND. No dizziness or syncope. No blood in stool or urine. No palpitations.    Past Medical History:  Diagnosis Date  . Asthma   . Breast cancer in female [facility])    1994 s/p chemo and radical L mastectomy  . Diabetes ([place])   . DVT (deep venous thrombosis) (HCC)    in shoulder after shoulder replacement  . Full dentures   . GERD (gastroesophageal reflux disease)   . Gout   . H/O total shoulder replacement   . History of knee replacement, total, bilateral   . 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 now on daily doxycycline.  Marland Kitchen HTN (hypertension)   . Lymphedema of left arm    s/p mastectomy with lymph node removal for breast cancer  .  Morbid obesity ([place])   . Neuropathy   . OSA on CPAP   . Ovarian cancer ([place])    2012 s/p chemo and radical hysterectomy  . PAF (paroxysmal atrial fibrillation) (HCC)    on Eliquis  . Pulmonary nodules    seen on pre TAVR CT scan. needs follow up CT in 06/2019  . S/P TAVR (transcatheter  aortic valve replacement)    23 mm Edwards Sapien 3 transcatheter heart valve placed via percutaneous right transfemoral approach   . Severe aortic stenosis   . Urine incontinence    Past Surgical History:  Procedure Laterality Date  . MASTECTOMY Left 1993  . RIGHT/LEFT HEART CATH AND CORONARY ANGIOGRAPHY N/A 06/25/2018   Procedure: RIGHT/LEFT HEART CATH AND CORONARY ANGIOGRAPHY;  Surgeon: Belva Crome, MD;  Location: [place] [facility];  Service: Cardiovascular;  Laterality: N/A;  . TEE WITHOUT CARDIOVERSION N/A 06/29/2018   Procedure: TRANSESOPHAGEAL ECHOCARDIOGRAM (TEE);  Surgeon: Sherren Mocha, MD;  Location: [place];  Service: Open Heart Surgery;  Laterality: N/A;  . TRANSCATHETER AORTIC VALVE REPLACEMENT, TRANSFEMORAL N/A 06/29/2018   Procedure: TRANSCATHETER AORTIC VALVE REPLACEMENT, TRANSFEMORAL;  Surgeon: Sherren Mocha, MD;  Location: [place];  Service: Open Heart Surgery;  Laterality: N/A;     Current Meds  Medication Sig  . acetaminophen (TYLENOL) 325 MG tablet Take 650 mg by mouth every 6 (six) hours as needed for mild pain or fever.  Marland Kitchen apixaban (ELIQUIS) 5 MG TABS tablet Take 5 mg by mouth 2 (two) times daily.  Marland Kitchen atorvastatin (LIPITOR) 40 MG tablet Take 40 mg by mouth daily.  Marland Kitchen azelastine (ASTELIN) 0.1 % nasal spray Place 1 spray into both nostrils 2 (two) times daily. Use in each nostril as directed  . calcium carbonate (OS-CAL - DOSED IN MG OF ELEMENTAL CALCIUM) 1250 (500 Ca) MG tablet Take 1 tablet by mouth 2 (two) times daily with a meal.  . doxycycline (VIBRA-TABS) 100 MG tablet Take 100 mg by mouth 2 (two) times daily.  . DULoxetine (CYMBALTA) 30 MG capsule Take 30 mg by mouth 2 (two) times daily.  Marland Kitchen esomeprazole (NEXIUM) 40 MG capsule Take 40 mg by mouth daily at 12 noon.  . fluticasone furoate-vilanterol (BREO ELLIPTA) 200-25 MCG/INH AEPB Inhale 1 puff into the lungs daily.  . furosemide (LASIX) 20 MG tablet Take 1 tablet (20 mg total) by mouth daily.  . metFORMIN  (GLUCOPHAGE) 500 MG tablet Take 1,000 mg by mouth 2 (two) times daily with a meal.  . metoprolol succinate (TOPROL-XL) 25 MG 24 hr tablet Take 25 mg by mouth daily.  . montelukast (SINGULAIR) 10 MG tablet Take 10 mg by mouth at bedtime.  . Multiple Vitamin (MULTIVITAMIN WITH MINERALS) TABS tablet Take 1 tablet by mouth daily.  . potassium chloride (KLOR-CON) 10 MEQ tablet Take 1 tablet (10 mEq total) by mouth daily.  . sitaGLIPtin (JANUVIA) 50 MG tablet Take 50 mg by mouth daily.   . vitamin E 100 UNIT capsule Take 100 Units by mouth daily.  . [DISCONTINUED] aspirin 81 MG chewable tablet Chew 1 tablet (81 mg total) by mouth daily.     Allergies:   Pneumovax [pneumococcal polysaccharide vaccine], Sulfa antibiotics, and Levaquin [levofloxacin]   Social History   Tobacco Use  . Smoking status: Former Smoker    Types: Cigarettes  . Smokeless tobacco: Never Used  Substance Use Topics  . Alcohol use: Never  . Drug use: Never     Family Hx: The patient's family history includes Heart  disease in her father; Hypertension in her mother; Renal cancer in her brother.  ROS:   Please see the history of present illness.     All other systems reviewed and are negative.   Prior CV studies:   The following studies were reviewed today:  TAVR OPERATIVE NOTE   Date of Procedure:06/29/2018  Preoperative Diagnosis:Severe Aortic Stenosis   Postoperative Diagnosis:Same   Procedure:   Transcatheter Aortic Valve Replacement - PercutaneousRightTransfemoral Approach Edwards Sapien 3 THV (size 53mm, model # 9600TFX, serial # [id number])  Co-Surgeons:Clarence H. Roxy Manns, MD and Sherren Mocha, MD  Anesthesiologist:Charlene Nyoka Cowden, MD  Dala Dock, MD  Pre-operative Echo Findings: ? Severe aortic stenosis ? Moderate mitral stenosis ? Normalleft ventricular systolic  function  Post-operative Echo Findings: ? Noparavalvular leak ? Normalleft ventricular systolic function  _____________   Echo 06/30/18 IMPRESSIONS 1. The left ventricle has normal systolic function with an ejection fraction of 60-65%. The cavity size was normal. There is severe concentric left ventricular hypertrophy. Left ventricular diastolic Doppler parameters are consistent with  pseudonormalization. Elevated left ventricular end-diastolic pressure. 2. The right ventricle has normal systolic function. The cavity was normal. There is no increase in right ventricular wall thickness. Right ventricular systolic pressure is moderately elevated with an estimated pressure of 44.2 mmHg. 3. Left atrial size was mildly dilated. 4. The mitral valve is degenerative. Severe thickening of the mitral valve leaflet. Moderate calcification of the mitral valve leaflet. There is moderate to severe mitral annular calcification present. Moderate mitral valve stenosis. MV Mean grad: 5.0  mmHg. 5. - TAVR: S/P Edwards Sapien 3 THV 25mm TAVR which appears well seated. There is no perivalvular AI. AV Area (VTI):1.02 cm. AV Mean Grad:19.2 mmHg. LVOT/AV VTI ratio:0.45. 6. Compared to prior report a TAVR is now present.  ________________  Echo 06/29/19 IMPRESSIONS  1. Left ventricular ejection fraction, by estimation, is 55 to 60%. The  left ventricle has normal function. The left ventricle has no regional  wall motion abnormalities. There is mild concentric left ventricular  hypertrophy. Left ventricular diastolic parameters are consistent with Grade II diastolic dysfunction (pseudonormalization). Elevated left ventricular end-diastolic pressure.  2. Right ventricular systolic function is normal. The right ventricular  size is normal. There is moderately elevated pulmonary artery systolic  pressure.  3. Left atrial size was mild to moderately dilated.  4. Right atrial size was mildly  dilated.  5. The mitral valve is degenerative. Mild mitral valve regurgitation.  Mild mitral stenosis.  6. The aortic valve has been repaired/replaced. Aortic valve  regurgitation is not visualized. Mild aortic valve sclerosis is present,  with no evidence of aortic valve stenosis. There is a 23 mm Edwards Sapien  prosthetic (TAVR) valve present in the aortic position. Procedure Date: 06/2018. Echo findings are consistent with normal structure and function of the aortic valve prosthesis. Aortic valve area, by VTI measures 1.06 cm. Aortic valve mean gradient measures 19.0 mmHg. Aortic valve Vmax measures 2.80 m/s.  7. The inferior vena cava is normal in size with greater than 50%  respiratory variability, suggesting right atrial pressure of 3 mmHg.   Comparison(s): No significant change from prior study.   ________________   Labs/Other Tests and Data Reviewed:    EKG:  No ECG reviewed.  Recent Labs: No results found for requested labs within last 8760 hours.   Recent Lipid Panel Lab Results  Component Value Date/Time   CHOL 176 06/25/2018 12:23 PM   TRIG 187 (H) 06/25/2018 12:23 PM   HDL  55 06/25/2018 12:23 PM   CHOLHDL 3.2 06/25/2018 12:23 PM   LDLCALC 84 06/25/2018 12:23 PM    Wt Readings from Last 3 Encounters:  07/06/19 182 lb (82.6 kg)  11/16/18 187 lb (84.8 kg)  07/29/18 190 lb 8 oz (86.4 kg)     Objective:    Vital Signs:  BP 129/84   Pulse 78   Ht 4\' 8"  (1.422 m)   Wt 182 lb (82.6 kg)   SpO2 95%   BMI 40.80 kg/m    VITAL SIGNS:  reviewed  ASSESSMENT & PLAN:    Severe ASs/p TAVR:still on Eliquis and aspirin. She can stop aspirin at this time. Echo 4/21 showed EF 55%, normally functioning TAVR with a mean gradient of 19 mmHg and no PVL. She has NYHA class III symptoms, mostly related to her morbid obesity. She will continue regular follow up with her primary cardiologist, Dr. Sallyanne Kuster.  Chronic combined S/D CHF:EF 55-60% by most recent echo. No s/s  CHF. Continue on lasix 20mg  daily.   PAF:no palpitations. She can tell when she goes out of rhythm and she hasn't had a recent episode. Continue on Eliquis 5mg  BID  Hx of prosthetic joint septic arthritis:continue doxy  HTN:BP wellcontrolled. Continue current regimen  Pulmonary nodules: noted on pre TAVR CT scan. With 30 year pack history of smoking, she will need follow up CT scan in 12 months.CT was scheduled for 4/21, but this got cancelled. We will get this set up again   COVID-19 Education: The signs and symptoms of COVID-19 were discussed with the patient and how to seek care for testing (follow up with PCP or arrange E-visit). The importance of social distancing was discussed today.  Time:   Today, I have spent 12 minutes with the patient with telehealth technology discussing the above problems. This started as a Video visit but ended in a phone call due to technical difficulties with the connection.   Medication Adjustments/Labs and Tests Ordered: Current medicines are reviewed at length with the patient today.  Concerns regarding medicines are outlined above.   Tests Ordered: No orders of the defined types were placed in this encounter.   Medication Changes: No orders of the defined types were placed in this encounter.   Follow Up:  Virtual Visit  in 6 month(s)  Signed, Angelena Form, PA-C  07/06/2019 4:21 PM    [place]

## 2019-07-06 ENCOUNTER — Other Ambulatory Visit: Payer: Self-pay

## 2019-07-06 ENCOUNTER — Encounter: Payer: Self-pay | Admitting: Physician Assistant

## 2019-07-06 ENCOUNTER — Telehealth (INDEPENDENT_AMBULATORY_CARE_PROVIDER_SITE_OTHER): Payer: Medicare Other | Admitting: Physician Assistant

## 2019-07-06 VITALS — BP 129/84 | HR 78 | Ht <= 58 in | Wt 182.0 lb

## 2019-07-06 DIAGNOSIS — I10 Essential (primary) hypertension: Secondary | ICD-10-CM

## 2019-07-06 DIAGNOSIS — Z8739 Personal history of other diseases of the musculoskeletal system and connective tissue: Secondary | ICD-10-CM | POA: Diagnosis not present

## 2019-07-06 DIAGNOSIS — R918 Other nonspecific abnormal finding of lung field: Secondary | ICD-10-CM

## 2019-07-06 DIAGNOSIS — I5042 Chronic combined systolic (congestive) and diastolic (congestive) heart failure: Secondary | ICD-10-CM | POA: Diagnosis not present

## 2019-07-06 DIAGNOSIS — Z952 Presence of prosthetic heart valve: Secondary | ICD-10-CM | POA: Diagnosis not present

## 2019-07-06 NOTE — Patient Instructions (Signed)
Medication Instructions:  1) STOP ASPIRIN *If you need a refill on your cardiac medications before your next appointment, please call your pharmacy*  Follow-Up: At [facility], you and your health needs are our priority.  As part of our continuing mission to provide you with exceptional heart care, we have created designated Provider Care Teams.  These Care Teams include your primary Cardiologist (physician) and Advanced Practice Providers (APPs -  Physician Assistants and Nurse Practitioners) who all work together to provide you with the care you need, when you need it. Your next appointment:   6 month(s) The format for your next appointment:   In Person Provider:   You may see Sanda Klein, MD or one of the following Advanced Practice Providers on your designated Care Team:    Almyra Deforest, PA-C  Fabian Sharp, PA-C or   Roby Lofts, [place]

## 2019-07-22 ENCOUNTER — Ambulatory Visit
Admission: RE | Admit: 2019-07-22 | Discharge: 2019-07-22 | Disposition: A | Discharge status: Home / Self Care | Payer: Medicare Other | Source: Ambulatory Visit | Attending: Physician Assistant | Admitting: Physician Assistant

## 2019-07-22 ENCOUNTER — Other Ambulatory Visit: Payer: Self-pay

## 2019-07-22 DIAGNOSIS — R918 Other nonspecific abnormal finding of lung field: Secondary | ICD-10-CM

## 2019-09-03 ENCOUNTER — Emergency Department (HOSPITAL_COMMUNITY): Payer: Medicare Other

## 2019-09-03 ENCOUNTER — Encounter (HOSPITAL_COMMUNITY): Payer: Self-pay

## 2019-09-03 ENCOUNTER — Observation Stay (HOSPITAL_COMMUNITY)
Admission: EM | Admit: 2019-09-03 | Discharge: 2019-09-04 | Disposition: A | Discharge status: Home / Self Care | Payer: Medicare Other | Attending: Student | Admitting: Student

## 2019-09-03 ENCOUNTER — Observation Stay (HOSPITAL_BASED_OUTPATIENT_CLINIC_OR_DEPARTMENT_OTHER): Payer: Medicare Other

## 2019-09-03 ENCOUNTER — Other Ambulatory Visit: Payer: Self-pay

## 2019-09-03 DIAGNOSIS — Z96619 Presence of unspecified artificial shoulder joint: Secondary | ICD-10-CM | POA: Insufficient documentation

## 2019-09-03 DIAGNOSIS — Z8739 Personal history of other diseases of the musculoskeletal system and connective tissue: Secondary | ICD-10-CM | POA: Diagnosis not present

## 2019-09-03 DIAGNOSIS — R778 Other specified abnormalities of plasma proteins: Secondary | ICD-10-CM | POA: Diagnosis not present

## 2019-09-03 DIAGNOSIS — R0602 Shortness of breath: Secondary | ICD-10-CM | POA: Diagnosis present

## 2019-09-03 DIAGNOSIS — I2721 Secondary pulmonary arterial hypertension: Secondary | ICD-10-CM

## 2019-09-03 DIAGNOSIS — Z7951 Long term (current) use of inhaled steroids: Secondary | ICD-10-CM | POA: Insufficient documentation

## 2019-09-03 DIAGNOSIS — E1165 Type 2 diabetes mellitus with hyperglycemia: Secondary | ICD-10-CM | POA: Diagnosis not present

## 2019-09-03 DIAGNOSIS — Z7984 Long term (current) use of oral hypoglycemic drugs: Secondary | ICD-10-CM | POA: Diagnosis not present

## 2019-09-03 DIAGNOSIS — I5031 Acute diastolic (congestive) heart failure: Secondary | ICD-10-CM | POA: Diagnosis not present

## 2019-09-03 DIAGNOSIS — Z23 Encounter for immunization: Secondary | ICD-10-CM

## 2019-09-03 DIAGNOSIS — Z885 Allergy status to narcotic agent status: Secondary | ICD-10-CM | POA: Insufficient documentation

## 2019-09-03 DIAGNOSIS — I10 Essential (primary) hypertension: Secondary | ICD-10-CM | POA: Diagnosis present

## 2019-09-03 DIAGNOSIS — I480 Paroxysmal atrial fibrillation: Secondary | ICD-10-CM | POA: Diagnosis present

## 2019-09-03 DIAGNOSIS — J449 Chronic obstructive pulmonary disease, unspecified: Secondary | ICD-10-CM | POA: Diagnosis not present

## 2019-09-03 DIAGNOSIS — Z96653 Presence of artificial knee joint, bilateral: Secondary | ICD-10-CM | POA: Insufficient documentation

## 2019-09-03 DIAGNOSIS — I5033 Acute on chronic diastolic (congestive) heart failure: Secondary | ICD-10-CM | POA: Diagnosis present

## 2019-09-03 DIAGNOSIS — Z8543 Personal history of malignant neoplasm of ovary: Secondary | ICD-10-CM | POA: Insufficient documentation

## 2019-09-03 DIAGNOSIS — G4733 Obstructive sleep apnea (adult) (pediatric): Secondary | ICD-10-CM | POA: Diagnosis not present

## 2019-09-03 DIAGNOSIS — Z87891 Personal history of nicotine dependence: Secondary | ICD-10-CM | POA: Insufficient documentation

## 2019-09-03 DIAGNOSIS — Z882 Allergy status to sulfonamides status: Secondary | ICD-10-CM | POA: Insufficient documentation

## 2019-09-03 DIAGNOSIS — E1140 Type 2 diabetes mellitus with diabetic neuropathy, unspecified: Secondary | ICD-10-CM | POA: Diagnosis not present

## 2019-09-03 DIAGNOSIS — Z792 Long term (current) use of antibiotics: Secondary | ICD-10-CM | POA: Diagnosis not present

## 2019-09-03 DIAGNOSIS — Z952 Presence of prosthetic heart valve: Secondary | ICD-10-CM

## 2019-09-03 DIAGNOSIS — Z7901 Long term (current) use of anticoagulants: Secondary | ICD-10-CM | POA: Insufficient documentation

## 2019-09-03 DIAGNOSIS — K219 Gastro-esophageal reflux disease without esophagitis: Secondary | ICD-10-CM | POA: Insufficient documentation

## 2019-09-03 DIAGNOSIS — E119 Type 2 diabetes mellitus without complications: Secondary | ICD-10-CM

## 2019-09-03 DIAGNOSIS — Z86718 Personal history of other venous thrombosis and embolism: Secondary | ICD-10-CM | POA: Insufficient documentation

## 2019-09-03 DIAGNOSIS — E1159 Type 2 diabetes mellitus with other circulatory complications: Secondary | ICD-10-CM | POA: Diagnosis not present

## 2019-09-03 DIAGNOSIS — Z20822 Contact with and (suspected) exposure to covid-19: Secondary | ICD-10-CM | POA: Diagnosis not present

## 2019-09-03 DIAGNOSIS — Z6841 Body Mass Index (BMI) 40.0 and over, adult: Secondary | ICD-10-CM | POA: Insufficient documentation

## 2019-09-03 DIAGNOSIS — Z881 Allergy status to other antibiotic agents status: Secondary | ICD-10-CM | POA: Diagnosis not present

## 2019-09-03 DIAGNOSIS — E785 Hyperlipidemia, unspecified: Secondary | ICD-10-CM | POA: Diagnosis not present

## 2019-09-03 DIAGNOSIS — J9621 Acute and chronic respiratory failure with hypoxia: Principal | ICD-10-CM | POA: Diagnosis present

## 2019-09-03 DIAGNOSIS — I110 Hypertensive heart disease with heart failure: Secondary | ICD-10-CM | POA: Diagnosis not present

## 2019-09-03 DIAGNOSIS — Z853 Personal history of malignant neoplasm of breast: Secondary | ICD-10-CM | POA: Insufficient documentation

## 2019-09-03 DIAGNOSIS — Z79899 Other long term (current) drug therapy: Secondary | ICD-10-CM | POA: Diagnosis not present

## 2019-09-03 DIAGNOSIS — Z9989 Dependence on other enabling machines and devices: Secondary | ICD-10-CM

## 2019-09-03 LAB — CBC WITH DIFFERENTIAL/PLATELET
Abs Immature Granulocytes: 0.14 10*3/uL — ABNORMAL HIGH (ref 0.00–0.07)
Basophils Absolute: 0.1 10*3/uL (ref 0.0–0.1)
Basophils Relative: 1 %
Eosinophils Absolute: 0.2 10*3/uL (ref 0.0–0.5)
Eosinophils Relative: 1 %
HCT: 43.7 % (ref 36.0–46.0)
Hemoglobin: 13.9 g/dL (ref 12.0–15.0)
Immature Granulocytes: 1 %
Lymphocytes Relative: 46 %
Lymphs Abs: 7.2 10*3/uL — ABNORMAL HIGH (ref 0.7–4.0)
MCH: 31 pg (ref 26.0–34.0)
MCHC: 31.8 g/dL (ref 30.0–36.0)
MCV: 97.5 fL (ref 80.0–100.0)
Monocytes Absolute: 1.3 10*3/uL — ABNORMAL HIGH (ref 0.1–1.0)
Monocytes Relative: 9 %
Neutro Abs: 6.5 10*3/uL (ref 1.7–7.7)
Neutrophils Relative %: 42 %
Platelets: 379 10*3/uL (ref 150–400)
RBC: 4.48 MIL/uL (ref 3.87–5.11)
RDW: 14.6 % (ref 11.5–15.5)
WBC: 15.5 10*3/uL — ABNORMAL HIGH (ref 4.0–10.5)
nRBC: 0 % (ref 0.0–0.2)

## 2019-09-03 LAB — TROPONIN I (HIGH SENSITIVITY)
Troponin I (High Sensitivity): 174 ng/L (ref <18)
Troponin I (High Sensitivity): 137 ng/L (ref <18)
Troponin I (High Sensitivity): 160 ng/L (ref <18)

## 2019-09-03 LAB — GLUCOSE, CAPILLARY
Glucose-Capillary: 187 mg/dL — ABNORMAL HIGH (ref 70–99)
Glucose-Capillary: 188 mg/dL — ABNORMAL HIGH (ref 70–99)
Glucose-Capillary: 184 mg/dL — ABNORMAL HIGH (ref 70–99)
Glucose-Capillary: 182 mg/dL — ABNORMAL HIGH (ref 70–99)

## 2019-09-03 LAB — MRSA PCR SCREENING: MRSA by PCR: NEGATIVE

## 2019-09-03 LAB — ECHOCARDIOGRAM COMPLETE
Height: 56 in
Weight: 2980.62 oz

## 2019-09-03 LAB — BASIC METABOLIC PANEL
Anion gap: 13 (ref 5–15)
BUN: 9 mg/dL (ref 8–23)
CO2: 23 mmol/L (ref 22–32)
Calcium: 8.8 mg/dL — ABNORMAL LOW (ref 8.9–10.3)
Chloride: 100 mmol/L (ref 98–111)
Creatinine, Ser: 0.86 mg/dL (ref 0.44–1.00)
GFR calc Af Amer: >60 mL/min (ref >60)
GFR calc non Af Amer: >60 mL/min (ref >60)
Glucose, Bld: 374 mg/dL — ABNORMAL HIGH (ref 70–99)
Potassium: 4.2 mmol/L (ref 3.5–5.1)
Sodium: 136 mmol/L (ref 135–145)

## 2019-09-03 LAB — APTT: aPTT: 29 seconds (ref 24–36)

## 2019-09-03 LAB — POC SARS CORONAVIRUS 2 AG -  ED: SARS Coronavirus 2 Ag: NEGATIVE

## 2019-09-03 LAB — PROTIME-INR
INR: 1.1 (ref 0.8–1.2)
Prothrombin Time: 14.1 seconds (ref 11.4–15.2)

## 2019-09-03 LAB — MAGNESIUM: Magnesium: 2.5 mg/dL — ABNORMAL HIGH (ref 1.7–2.4)

## 2019-09-03 LAB — HEMOGLOBIN A1C
Hgb A1c MFr Bld: 7.4 % — ABNORMAL HIGH (ref 4.8–5.6)
Mean Plasma Glucose: 165.68 mg/dL

## 2019-09-03 LAB — SARS CORONAVIRUS 2 BY RT PCR (HOSPITAL ORDER, PERFORMED IN ~~LOC~~ HOSPITAL LAB): SARS Coronavirus 2: NEGATIVE

## 2019-09-03 LAB — BRAIN NATRIURETIC PEPTIDE: B Natriuretic Peptide: 316.6 pg/mL — ABNORMAL HIGH (ref 0.0–100.0)

## 2019-09-03 MED ORDER — INSULIN ASPART 100 UNIT/ML ~~LOC~~ SOLN
0.0000 [IU] | SUBCUTANEOUS | Status: DC
  Administered 2019-09-03 (×2): 2 [IU] via SUBCUTANEOUS

## 2019-09-03 MED ORDER — DOXYCYCLINE HYCLATE 100 MG PO TABS
100.0000 mg | ORAL_TABLET | Freq: Two times a day (BID) | ORAL | Status: DC
  Administered 2019-09-03 – 2019-09-04 (×3): 100 mg via ORAL
  Filled 2019-09-03 (×3): qty 1

## 2019-09-03 MED ORDER — POTASSIUM CHLORIDE CRYS ER 20 MEQ PO TBCR
20.0000 meq | EXTENDED_RELEASE_TABLET | Freq: Once | ORAL | Status: DC
  Filled 2019-09-03: qty 1

## 2019-09-03 MED ORDER — DULOXETINE HCL 30 MG PO CPEP
30.0000 mg | ORAL_CAPSULE | Freq: Two times a day (BID) | ORAL | Status: DC
  Administered 2019-09-03 – 2019-09-04 (×3): 30 mg via ORAL
  Filled 2019-09-03 (×3): qty 1

## 2019-09-03 MED ORDER — AZELASTINE HCL 0.1 % NA SOLN
1.0000 | Freq: Two times a day (BID) | NASAL | Status: DC
  Administered 2019-09-03 – 2019-09-04 (×3): 1 via NASAL
  Filled 2019-09-03: qty 30

## 2019-09-03 MED ORDER — CALCIUM CARBONATE 1250 (500 CA) MG PO TABS
1.0000 | ORAL_TABLET | Freq: Two times a day (BID) | ORAL | Status: DC
  Administered 2019-09-03 – 2019-09-04 (×3): 500 mg via ORAL
  Filled 2019-09-03 (×4): qty 1

## 2019-09-03 MED ORDER — ONDANSETRON HCL 4 MG/2ML IJ SOLN: 4.0000 mg | Freq: Four times a day (QID) | INTRAMUSCULAR | Status: DC | PRN

## 2019-09-03 MED ORDER — PANTOPRAZOLE SODIUM 40 MG PO TBEC
80.0000 mg | DELAYED_RELEASE_TABLET | Freq: Every day | ORAL | Status: DC
  Administered 2019-09-03: 80 mg via ORAL
  Filled 2019-09-03: qty 2

## 2019-09-03 MED ORDER — ATORVASTATIN CALCIUM 40 MG PO TABS
40.0000 mg | ORAL_TABLET | Freq: Every day | ORAL | Status: DC
  Administered 2019-09-03: 40 mg via ORAL
  Filled 2019-09-03: qty 1

## 2019-09-03 MED ORDER — FUROSEMIDE 10 MG/ML IJ SOLN
40.0000 mg | Freq: Two times a day (BID) | INTRAMUSCULAR | Status: DC
  Administered 2019-09-03 – 2019-09-04 (×3): 40 mg via INTRAVENOUS
  Filled 2019-09-03 (×3): qty 4

## 2019-09-03 MED ORDER — MONTELUKAST SODIUM 10 MG PO TABS
10.0000 mg | ORAL_TABLET | Freq: Every day | ORAL | Status: DC
  Administered 2019-09-03: 10 mg via ORAL
  Filled 2019-09-03: qty 1

## 2019-09-03 MED ORDER — SODIUM CHLORIDE 0.9% FLUSH: 3.0000 mL | INTRAVENOUS | Status: DC | PRN

## 2019-09-03 MED ORDER — FUROSEMIDE 10 MG/ML IJ SOLN
40.0000 mg | Freq: Once | INTRAMUSCULAR | Status: AC
  Administered 2019-09-03: 40 mg via INTRAVENOUS
  Filled 2019-09-03: qty 4

## 2019-09-03 MED ORDER — SODIUM CHLORIDE 0.9% FLUSH
3.0000 mL | Freq: Two times a day (BID) | INTRAVENOUS | Status: DC
  Administered 2019-09-03 (×2): 3 mL via INTRAVENOUS

## 2019-09-03 MED ORDER — INSULIN ASPART 100 UNIT/ML ~~LOC~~ SOLN: 0.0000 [IU] | Freq: Every day | SUBCUTANEOUS | Status: DC

## 2019-09-03 MED ORDER — ACETAMINOPHEN 325 MG PO TABS: 650.0000 mg | ORAL_TABLET | ORAL | Status: DC | PRN

## 2019-09-03 MED ORDER — FLUTICASONE FUROATE-VILANTEROL 200-25 MCG/INH IN AEPB
1.0000 | INHALATION_SPRAY | Freq: Every day | RESPIRATORY_TRACT | Status: DC
  Administered 2019-09-04: 1 via RESPIRATORY_TRACT
  Filled 2019-09-03: qty 28

## 2019-09-03 MED ORDER — SODIUM CHLORIDE 0.9 % IV SOLN: 250.0000 mL | INTRAVENOUS | Status: DC | PRN

## 2019-09-03 MED ORDER — METOPROLOL SUCCINATE ER 25 MG PO TB24
25.0000 mg | ORAL_TABLET | Freq: Every day | ORAL | Status: DC
  Administered 2019-09-03 – 2019-09-04 (×2): 25 mg via ORAL
  Filled 2019-09-03 (×2): qty 1

## 2019-09-03 MED ORDER — INSULIN ASPART 100 UNIT/ML ~~LOC~~ SOLN
0.0000 [IU] | Freq: Three times a day (TID) | SUBCUTANEOUS | Status: DC
  Administered 2019-09-03: 3 [IU] via SUBCUTANEOUS
  Administered 2019-09-04: 5 [IU] via SUBCUTANEOUS
  Administered 2019-09-04: 3 [IU] via SUBCUTANEOUS

## 2019-09-03 MED ORDER — LORATADINE 10 MG PO TABS
10.0000 mg | ORAL_TABLET | Freq: Every day | ORAL | Status: DC
  Administered 2019-09-03 – 2019-09-04 (×2): 10 mg via ORAL
  Filled 2019-09-03 (×2): qty 1

## 2019-09-03 MED ORDER — APIXABAN 5 MG PO TABS
5.0000 mg | ORAL_TABLET | Freq: Two times a day (BID) | ORAL | Status: DC
  Administered 2019-09-03 – 2019-09-04 (×3): 5 mg via ORAL
  Filled 2019-09-03 (×4): qty 1

## 2019-09-03 NOTE — ED Provider Notes (Addendum)
Tanya [name]   CSN: [id number] Arrival date & time: 09/03/19  0120     History Chief Complaint  Patient presents with  . Congestive Heart Failure  . Respiratory Distress    Tanya [name] is a 74 y.o. female.  HPI     74 year old female comes in a chief complaint of shortness of breath. Patient has history of PAF, CHF, DVT, aortic stenosis status post TAVR.  Level 5 caveat for acute respiratory distress/acuity of condition.  Patient brought in via EMS for chief complaint of shortness of breath.  Patient had aortic valve replaced 2 months ago.  She reports that she had been getting gradually short of breath during the day, and the symptoms progressed at night.  Patient use her home CPAP with minimal relief.  O2 sats in the 80s when EMS arrived.  Patient was hypertensive.  She had received nitro and 2 mg of magnesium.  Patient denies any fevers, chills.  She is not vaccinated against Covid, she denies any COVID-19 exposures.  She is a mild cough.  She is unsure of any weight gain.  Past Medical History:  Diagnosis Date  . Asthma   . Breast cancer in female [facility])    1994 s/p chemo and radical L mastectomy  . Diabetes ([place])   . DVT (deep venous thrombosis) (HCC)    in shoulder after shoulder replacement  . Full dentures   . GERD (gastroesophageal reflux disease)   . Gout   . H/O total shoulder replacement   . History of knee replacement, total, bilateral   . 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 now on daily doxycycline.  Marland Kitchen HTN (hypertension)   . Lymphedema of left arm    s/p mastectomy with lymph node removal for breast cancer  . Morbid obesity ([place])   . Neuropathy   . OSA on CPAP   . Ovarian cancer ([place])    2012 s/p chemo and radical hysterectomy  . PAF (paroxysmal atrial fibrillation) (HCC)    on Eliquis  . Pulmonary nodules    seen on pre TAVR CT scan.  needs follow up CT in 06/2019  . S/P TAVR (transcatheter aortic valve replacement)    23 mm Edwards Sapien 3 transcatheter heart valve placed via percutaneous right transfemoral approach   . Severe aortic stenosis   . Urine incontinence     Patient Active Problem List   Diagnosis Date Noted  . S/P TAVR (transcatheter aortic valve replacement)   . Pulmonary nodules   . Moderate pulmonary arterial systolic hypertension ([place]) 06/26/2018  . Acute on chronic respiratory failure with hypoxia ([place])   . Demand ischemia ([place])   . Moderate mitral stenosis   . Acute on chronic diastolic CHF (congestive heart failure) ([place])   . Urine incontinence   . Severe aortic stenosis   . History of septic arthritis   . PAF (paroxysmal atrial fibrillation) ([place])   . Ovarian cancer ([place])   . OSA on CPAP   . Neuropathy   . HTN (hypertension)   . History of knee replacement, total, bilateral   . H/O total shoulder replacement   . Gout   . GERD (gastroesophageal reflux disease)   . Full dentures   . Diabetes ([place])   . Breast cancer in female [facility])   . Asthma   . Lymphedema of left arm   . Morbid obesity ([place])   .  Acute hypercapnic respiratory failure ([place]) 06/22/2018  . DKA (diabetic ketoacidoses) ([place]) 06/22/2018  . Elevated brain natriuretic peptide (BNP) level 06/22/2018  . Neutrophilic leukocytosis 49/67/5916    Past Surgical History:  Procedure Laterality Date  . MASTECTOMY Left 1993  . RIGHT/LEFT HEART CATH AND CORONARY ANGIOGRAPHY N/A 06/25/2018   Procedure: RIGHT/LEFT HEART CATH AND CORONARY ANGIOGRAPHY;  Surgeon: Belva Crome, MD;  Location: [place] [facility];  Service: Cardiovascular;  Laterality: N/A;  . TEE WITHOUT CARDIOVERSION N/A 06/29/2018   Procedure: TRANSESOPHAGEAL ECHOCARDIOGRAM (TEE);  Surgeon: Sherren Mocha, MD;  Location: [place];  Service: Open Heart Surgery;  Laterality: N/A;  . TRANSCATHETER AORTIC VALVE REPLACEMENT, TRANSFEMORAL N/A 06/29/2018   Procedure: TRANSCATHETER  AORTIC VALVE REPLACEMENT, TRANSFEMORAL;  Surgeon: Sherren Mocha, MD;  Location: [place];  Service: Open Heart Surgery;  Laterality: N/A;     OB History   No obstetric history on file.     Family History  Problem Relation Age of Onset  . Hypertension Mother   . Heart disease Father   . Renal cancer Brother     Social History   Tobacco Use  . Smoking status: Former Smoker    Types: Cigarettes  . Smokeless tobacco: Never Used  Vaping Use  . Vaping Use: Never used  Substance Use Topics  . Alcohol use: Never  . Drug use: Never    Home Medications Prior to Admission medications   Medication Sig Start Date End Date Taking? Authorizing Provider  acetaminophen (TYLENOL) 325 MG tablet Take 650 mg by mouth every 6 (six) hours as needed for mild pain or fever.   Yes [provider]  apixaban (ELIQUIS) 5 MG TABS tablet Take 5 mg by mouth 2 (two) times daily.   Yes [provider]  atorvastatin (LIPITOR) 40 MG tablet Take 40 mg by mouth daily.   Yes [provider]  azelastine (ASTELIN) 0.1 % nasal spray Place 1 spray into both nostrils 2 (two) times daily. Use in each nostril as directed   Yes [provider]  calcium carbonate (OS-CAL - DOSED IN MG OF ELEMENTAL CALCIUM) 1250 (500 Ca) MG tablet Take 1 tablet by mouth 2 (two) times daily with a meal.   Yes [provider]  doxycycline (VIBRA-TABS) 100 MG tablet Take 100 mg by mouth 2 (two) times daily.   Yes [provider]  DULoxetine (CYMBALTA) 30 MG capsule Take 30 mg by mouth 2 (two) times daily.   Yes [provider]  esomeprazole (NEXIUM) 40 MG capsule Take 40 mg by mouth daily at 12 noon.   Yes [provider]  fexofenadine (ALLEGRA) 180 MG tablet Take 180 mg by mouth daily. 08/22/19  Yes [provider]  fluticasone furoate-vilanterol (BREO ELLIPTA) 200-25 MCG/INH AEPB Inhale 1 puff into the lungs daily.   Yes [provider]  furosemide  (LASIX) 20 MG tablet Take 1 tablet (20 mg total) by mouth daily. 11/16/18  Yes Croitoru, Mihai, MD  metFORMIN (GLUCOPHAGE) 500 MG tablet Take 1,000 mg by mouth 2 (two) times daily with a meal.   Yes [provider]  metoprolol succinate (TOPROL-XL) 25 MG 24 hr tablet Take 25 mg by mouth daily.   Yes [provider]  montelukast (SINGULAIR) 10 MG tablet Take 10 mg by mouth at bedtime.   Yes [provider]  Multiple Vitamin (MULTIVITAMIN WITH MINERALS) TABS tablet Take 1 tablet by mouth daily.   Yes [provider]  potassium chloride (KLOR-CON) 10 MEQ  tablet Take 1 tablet (10 mEq total) by mouth daily. 12/16/18  Yes Croitoru, Mihai, MD  sitaGLIPtin (JANUVIA) 50 MG tablet Take 50 mg by mouth daily.    Yes [provider]  vitamin E 100 UNIT capsule Take 100 Units by mouth daily.   Yes [provider]    Allergies    Fentanyl, Pneumovax [pneumococcal polysaccharide vaccine], Sulfa antibiotics, and Levaquin [levofloxacin]  Review of Systems   Review of Systems  Unable to perform ROS: Severe respiratory distress    Physical Exam Updated Vital Signs BP 117/70   Pulse 82   Temp 97.8 F (36.6 C) (Axillary)   Resp (!) 23   Ht 4\' 9"  (1.448 m)   Wt 83 kg   SpO2 98%   BMI 39.60 kg/m   Physical Exam Vitals and nursing [name] reviewed.  Constitutional:      Appearance: She is well-developed.  HENT:     Head: Normocephalic and atraumatic.  Cardiovascular:     Rate and Rhythm: Tachycardia present.  Pulmonary:     Effort: Pulmonary effort is normal.     Breath sounds: Rales present.     Comments: Bibasilar rales Abdominal:     General: Bowel sounds are normal.  Musculoskeletal:     Cervical back: Normal range of motion and neck supple.     Right lower leg: Edema present.     Left lower leg: Edema present.     Comments: 1+ pitting edema in lower extremities bilaterally  Skin:    General: Skin is warm and dry.  Neurological:      Mental Status: She is alert and oriented to person, place, and time.     ED Results / Procedures / Treatments   Labs (all labs ordered are listed, but only abnormal results are displayed) Labs Reviewed  BASIC METABOLIC PANEL - Abnormal; Notable for the following components:      Result Value   Glucose, Bld 374 (*)    Calcium 8.8 (*)    All other components within normal limits  MAGNESIUM - Abnormal; Notable for the following components:   Magnesium 2.5 (*)    All other components within normal limits  BRAIN NATRIURETIC PEPTIDE - Abnormal; Notable for the following components:   B Natriuretic Peptide 316.6 (*)    All other components within normal limits  CBC WITH DIFFERENTIAL/PLATELET - Abnormal; Notable for the following components:   WBC 15.5 (*)    Lymphs Abs 7.2 (*)    Monocytes Absolute 1.3 (*)    Abs Immature Granulocytes 0.14 (*)    All other components within normal limits  SARS CORONAVIRUS 2 BY RT PCR (HOSPITAL ORDER, [facility])  APTT  PROTIME-INR  PATHOLOGIST SMEAR REVIEW  HEMOGLOBIN A1C  POC SARS CORONAVIRUS 2 AG -  ED  TROPONIN I (HIGH SENSITIVITY)    EKG EKG Interpretation  Date/Time:  Saturday September 03 2019 01:32:38 EDT Ventricular Rate:  107 PR Interval:    QRS Duration: 162 QT Interval:  359 QTC Calculation: 479 R Axis:   59 Text Interpretation: Sinus tachycardia Left bundle branch block Nonspecific ST and T wave abnormality No significant change since last tracing Confirmed by Varney Biles [id number]) on 09/03/2019 2:09:20 AM   Radiology DG Chest Portable 1 View  Result Date: 09/03/2019 CLINICAL DATA:  Shortness of breath EXAM: PORTABLE CHEST 1 VIEW COMPARISON:  06/29/2018 FINDINGS: Moderate cardiomegaly with moderate interstitial pulmonary edema and small pleural effusions. Status post TAVR. IMPRESSION: Moderate  cardiomegaly and small pleural effusions with moderate interstitial pulmonary edema. Electronically Signed   By:  Ulyses Jarred M.D.   On: 09/03/2019 02:10    Procedures .Critical Care Performed by: Varney Biles, MD Authorized by: Varney Biles, MD   Critical care provider statement:    Critical care time (minutes):  45   Critical care was necessary to treat or prevent imminent or life-threatening deterioration of the following conditions:  Cardiac failure and respiratory failure   Critical care was time spent personally by me on the following activities:  Discussions with consultants, evaluation of patient's response to treatment, examination of patient, ordering and performing treatments and interventions, ordering and review of laboratory studies, ordering and review of radiographic studies, pulse oximetry, re-evaluation of patient's condition, obtaining history from patient or surrogate and review of old charts   (including critical care time)  Medications Ordered in ED Medications  potassium chloride SA (KLOR-CON) CR tablet 20 mEq (0 mEq Oral Hold 09/03/19 0435)  doxycycline (VIBRA-TABS) tablet 100 mg (has no administration in time range)  insulin aspart (novoLOG) injection 0-9 Units (has no administration in time range)  furosemide (LASIX) injection 40 mg (has no administration in time range)  sodium chloride flush (NS) 0.9 % injection 3 mL (has no administration in time range)  sodium chloride flush (NS) 0.9 % injection 3 mL (has no administration in time range)  0.9 %  sodium chloride infusion (has no administration in time range)  acetaminophen (TYLENOL) tablet 650 mg (has no administration in time range)  ondansetron (ZOFRAN) injection 4 mg (has no administration in time range)  apixaban (ELIQUIS) tablet 5 mg (has no administration in time range)  metoprolol succinate (TOPROL-XL) 24 hr tablet 25 mg (has no administration in time range)  montelukast (SINGULAIR) tablet 10 mg (has no administration in time range)  pantoprazole (PROTONIX) EC tablet 80 mg (has no administration in time  range)  DULoxetine (CYMBALTA) DR capsule 30 mg (has no administration in time range)  atorvastatin (LIPITOR) tablet 40 mg (has no administration in time range)  fluticasone furoate-vilanterol (BREO ELLIPTA) 200-25 MCG/INH 1 puff (has no administration in time range)  azelastine (ASTELIN) 0.1 % nasal spray 1 spray (has no administration in time range)  loratadine (CLARITIN) tablet 10 mg (has no administration in time range)  calcium carbonate (OS-CAL - dosed in mg of elemental calcium) tablet 500 mg of elemental calcium (has no administration in time range)  furosemide (LASIX) injection 40 mg (40 mg Intravenous Given 09/03/19 0414)    ED Course  I have reviewed the triage vital signs and the nursing notes.  Pertinent labs & imaging results that were available during my care of the patient were reviewed by me and considered in my medical decision making (see chart for details).  Clinical Course as of Sep 03 507  Sat Sep 03, 2019  0315 Patient is appearing a lot more comfortable. Labs reveal slightly elevated BNP.  White count is elevated, however x-rays not suggestive of infection.  Clinical suspicion remains high for acute CHF exacerbation.    [AN]  0405 Patient has been taken off of BiPAP now.  IV Lasix ordered. Patient reports that she is always short of breath, however shortness of breath got worse tonight despite her using her home CPAP machine.  Will order IV Lasix 40 mg.  We will proceed with admission request.   [AN]  0508 Patient reassessed.  She is comfortable on BiPAP.  She is on Eliquis for A. Fib  and denies any new pleuritic pain.  She has chronic pleuritic pain..  The TAVR was actually in April 2020.  No signs of DVT.  I do not think PE work-up is needed.  I discussed this with the admitting staff, Dr. Alcario Drought, who take on patient's care.   [AN]    Clinical Course User Index [AN] Varney Biles, MD   MDM Rules/Calculators/A&P                          74 year old comes in  a chief complaint of shortness of breath. Patient has been placed on BiPAP.  She is noted to have respiratory distress with tachycardia and bibasilar rales with mild evidence of volume overload in the lower extremities.  Differential diagnosis includes CHF exacerbation, ACS, pleural effusion, pulmonary edema, multifocal pneumonia.  We will get basic labs and reassess.  She has stabilized with BiPAP on.  Tanya [name] was evaluated in Emergency Department on 09/03/2019 for the symptoms described in the history of present illness. She was evaluated in the context of the global COVID-19 pandemic, which necessitated consideration that the patient might be at risk for infection with the SARS-CoV-2 virus that causes COVID-19. Institutional protocols and algorithms that pertain to the evaluation of patients at risk for COVID-19 are in a state of rapid change based on information released by regulatory bodies including the CDC and federal and state organizations. These policies and algorithms were followed during the patient's care in the ED.   Final Clinical Impression(s) / ED Diagnoses Final diagnoses:  Acute on chronic respiratory failure with hypoxia [facility])    Rx / DC Orders ED Discharge Orders    None        Varney Biles, MD 09/03/19 [phone]

## 2019-09-03 NOTE — ED Notes (Signed)
[name] daughter [phone] looking for an update when possible

## 2019-09-03 NOTE — Progress Notes (Signed)
  Echocardiogram 2D Echocardiogram has been performed.  Tanya [name] 09/03/2019, 11:10 AM

## 2019-09-03 NOTE — Progress Notes (Signed)
CRITICAL VALUE ALERT  Critical Value:  troponin  Date & Time Notied:  ** Provider Notified: 09/03/19@1110   Orders Received/Actions taken: text dr Cyndia Skeeters results will monitor will check another troponin in 6hr per protocol

## 2019-09-03 NOTE — Plan of Care (Signed)
  Problem: Health Behavior/Discharge Planning: Goal: Ability to manage health-related needs will improve Outcome: Progressing   Problem: Clinical Measurements: Goal: Ability to maintain clinical measurements within normal limits will improve Outcome: Progressing Goal: Will remain free from infection Outcome: Progressing Goal: Diagnostic test results will improve Outcome: Progressing Goal: Respiratory complications will improve Outcome: Progressing Goal: Cardiovascular complication will be avoided Outcome: Progressing   Problem: Activity: Goal: Risk for activity intolerance will decrease Outcome: Progressing   Problem: Nutrition: Goal: Adequate nutrition will be maintained Outcome: Progressing   Problem: Coping: Goal: Level of anxiety will decrease Outcome: Progressing   Problem: Elimination: Goal: Will not experience complications related to bowel motility Outcome: Progressing Goal: Will not experience complications related to urinary retention Outcome: Progressing   Problem: Pain Managment: Goal: General experience of comfort will improve Outcome: Progressing   Problem: Safety: Goal: Ability to remain free from injury will improve Outcome: Progressing   Problem: Skin Integrity: Goal: Risk for impaired skin integrity will decrease Outcome: Progressing   Problem: Education: Goal: Ability to demonstrate management of disease process will improve Outcome: Progressing Goal: Ability to verbalize understanding of medication therapies will improve Outcome: Progressing Goal: Individualized Educational Video(s) Outcome: Progressing   Problem: Activity: Goal: Capacity to carry out activities will improve Outcome: Progressing   Problem: Cardiac: Goal: Ability to achieve and maintain adequate cardiopulmonary perfusion will improve Outcome: Progressing   

## 2019-09-03 NOTE — ED Notes (Signed)
Daughter updated 

## 2019-09-03 NOTE — ED Triage Notes (Signed)
Pt BIB [facility] with Resp. [place]. Pt had aortic valve replaced 2 months ago.  Pt became SOB at home and used CPAP with little relief. EMS had pt on CPAP and oxygen raised to 90 and maintained in 80s.   3 SL NTG, 2G Mag given with EMS  BP 170s-200s Sys with EMS.  L. Arm Restricted (Mastectomy)  EKG LBB (Chronic)

## 2019-09-03 NOTE — Progress Notes (Signed)
PROGRESS NOTE  Tanya [name] MHD:[id number] DOB: 08-Apr-1945   PCP: Zara Chess, NP  Patient is from: Home.  Uses cane and walker at baseline.  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 fib on Eliquis, septic arthritis on chronic doxycycline, HTN, HLD, breast cancer s/p chemo and left radical mastectomy 1990s, ovarian cancer s/p radical hysterectomy in 2010, left arm lymphedema and debility presenting with progressive shortness of breath and hypoxemia in "80s" per EMS.  Admitted for acute on chronic respiratory failure due to acute on chronic diastolic CHF.  Started on IV diuretics and BiPAP.  Echocardiogram ordered.  She admits to missing some doses of her diuretics.   Subjective: Seen and examined earlier this morning.  No major events overnight of this morning.  Reports some improvement in her breathing.  Denies chest pain, orthopnea, PND or palpitation.  Denies GI or UTI symptoms.  Denies fever or cough.  Objective: Vitals:   09/03/19 0800 09/03/19 1106 09/03/19 1227 09/03/19 1308  BP: 121/67 123/69    Pulse: 71 76 77 77  Resp: 20 18 18 20   Temp: 97.8 F (36.6 C) 98.2 F (36.8 C)    TempSrc: Axillary Axillary    SpO2: 98% 98% 93% 93%  Weight:      Height:        Intake/Output Summary (Last 24 hours) at 09/03/2019 1407 Last data filed at 09/03/2019 1308 Gross per 24 hour  Intake 240 ml  Output --  Net 240 ml   Filed Weights   09/03/19 0125 09/03/19 0532  Weight: 83 kg 84.5 kg    Examination:  GENERAL: No apparent distress.  Nontoxic. HEENT: MMM.  Vision and hearing grossly intact.  NECK: Supple.  No apparent JVD.  RESP: On BiPAP.  No IWOB.  Bibasilar crackles. CVS:  RRR. Heart sounds normal.  ABD/GI/GU: BS+. Abd soft, NTND.  MSK/EXT:  Moves extremities. No apparent deformity.  Trace edema  bilaterally. SKIN: no apparent skin lesion or wound NEURO: Awake, alert and oriented appropriately.  No apparent focal neuro deficit. PSYCH: Calm. Normal affect.  Procedures:  None  Microbiology summarized: COVID-19 PCR negative.  Assessment & Plan: Acute on chronic respiratory failure with hypoxia-likely due to acute on chronic diastolic CHF. -Wean oxygen as able -Manage CHF as below -OOB/PT/OT  Acute on chronic diastolic CHF: Echo in 04/1192 with EF of 50 to 55%, G2 DD.  Presented with SOB.  CXR consistent with CHF.  BNP slightly elevated.  Also slight troponin leak.  Some improvement with IV diuretics but is still with bibasilar crackles.  I and O not captured well.  -Continue IV Lasix 40 mg twice daily -Follow echocardiogram -Monitor fluid status, renal function and electrolytes  Elevated troponin: Likely demand ischemia from CHF exacerbation.  EKG with chronic LBBB.  -Continue monitoring -Already on statin, Eliquis and beta-blocker  AS s/p TAVR: Seems patent per previous echo.  Paroxysmal A. fib: Rate controlled -Continue home metoprolol and Eliquis.  OSA on CPAP -Continue BiPAP as needed and nightly.  History of septic arthritis: On chronic doxycycline -Continued on doxycycline  Uncontrolled DM-2 with hyperglycemia: A1c 7.4%. Recent Labs  Lab 09/03/19 1044 09/03/19 1110  GLUCAP 188* 187*  -Increase SSI to moderate and add nightly coverage -Continue statins.  Leukocytosis: Likely demargination.  No obvious signs of infection. -Continue monitoring  History of breast  ca s/p chemo and left radical mastectomy 1990s, ovarian ca s/p radical hysterectomy in 2010 -Outpatient follow-up.  Morbid obesity Body mass index is 41.77 kg/m.         DVT prophylaxis:   apixaban (ELIQUIS) tablet 5 mg  Code Status: Full code Family Communication: Updated patient's daughter over the phone. Status is: Observation  The patient remains OBS appropriate and will d/c before  2 midnights.  Dispo: The patient is from: Home              Anticipated d/c is to: Home              Anticipated d/c date is: 1 day              Patient currently is not medically stable to d/c.       Consultants:  None   Sch Meds:  Scheduled Meds: . apixaban  5 mg Oral BID  . atorvastatin  40 mg Oral q1800  . azelastine  1 spray Each Nare BID  . calcium carbonate  1 tablet Oral BID WC  . doxycycline  100 mg Oral BID  . DULoxetine  30 mg Oral BID  . fluticasone furoate-vilanterol  1 puff Inhalation Daily  . furosemide  40 mg Intravenous BID  . insulin aspart  0-9 Units Subcutaneous Q4H  . loratadine  10 mg Oral Daily  . metoprolol succinate  25 mg Oral Daily  . montelukast  10 mg Oral QHS  . pantoprazole  80 mg Oral Q1200  . potassium chloride  20 mEq Oral Once  . sodium chloride flush  3 mL Intravenous Q12H   Continuous Infusions: . sodium chloride     PRN Meds:.sodium chloride, acetaminophen, ondansetron (ZOFRAN) IV, sodium chloride flush  Antimicrobials: Anti-infectives (From admission, onward)   Start     Dose/Rate Route Frequency Ordered Stop   09/03/19 1000  doxycycline (VIBRA-TABS) tablet 100 mg     Discontinue     100 mg Oral 2 times daily 09/03/19 0429         I have personally reviewed the following labs and images: CBC: Recent Labs  Lab 09/03/19 0138  WBC 15.5*  NEUTROABS 6.5  HGB 13.9  HCT 43.7  MCV 97.5  PLT 379   BMP &GFR Recent Labs  Lab 09/03/19 0138  NA 136  K 4.2  CL 100  CO2 23  GLUCOSE 374*  BUN 9  CREATININE 0.86  CALCIUM 8.8*  MG 2.5*   Estimated Creatinine Clearance: 48.8 mL/min (by C-G formula based on SCr of 0.86 mg/dL). Liver & Pancreas: No results for input(s): AST, ALT, ALKPHOS, BILITOT, PROT, ALBUMIN in the last 168 hours. No results for input(s): LIPASE, AMYLASE in the last 168 hours. No results for input(s): AMMONIA in the last 168 hours. Diabetic: Recent Labs    09/03/19 0940  HGBA1C 7.4*   Recent  Labs  Lab 09/03/19 1044 09/03/19 1110  GLUCAP 188* 187*   Cardiac Enzymes: No results for input(s): CKTOTAL, CKMB, CKMBINDEX, TROPONINI in the last 168 hours. No results for input(s): PROBNP in the last 8760 hours. Coagulation Profile: Recent Labs  Lab 09/03/19 0138  INR 1.1   Thyroid Function Tests: No results for input(s): TSH, T4TOTAL, FREET4, T3FREE, THYROIDAB in the last 72 hours. Lipid Profile: No results for input(s): CHOL, HDL, LDLCALC, TRIG, CHOLHDL, LDLDIRECT in the last 72 hours. Anemia Panel: No results for input(s): VITAMINB12, FOLATE, FERRITIN, TIBC, IRON, RETICCTPCT in the last 72 hours. Urine analysis:  Component Value Date/Time   COLORURINE YELLOW 06/28/2018 1730   APPEARANCEUR CLEAR 06/28/2018 1730   LABSPEC 1.017 06/28/2018 1730   PHURINE 5.0 06/28/2018 1730   GLUCOSEU NEGATIVE 06/28/2018 1730   HGBUR NEGATIVE 06/28/2018 1730   BILIRUBINUR NEGATIVE 06/28/2018 1730   KETONESUR NEGATIVE 06/28/2018 1730   PROTEINUR NEGATIVE 06/28/2018 1730   NITRITE NEGATIVE 06/28/2018 1730   LEUKOCYTESUR TRACE (A) 06/28/2018 1730   Sepsis Labs: Invalid input(s): PROCALCITONIN, [place]  Microbiology: Recent Results (from the past 240 hour(s))  SARS Coronavirus 2 by RT PCR (hospital order, performed in [facility] hospital lab) Nasopharyngeal Nasopharyngeal Swab     Status: None   Collection Time: 09/03/19  1:39 AM   Specimen: Nasopharyngeal Swab  Result Value Ref Range Status   SARS Coronavirus 2 NEGATIVE NEGATIVE Final    Comment: (NOTE) SARS-CoV-2 target nucleic acids are NOT DETECTED.  The SARS-CoV-2 RNA is generally detectable in upper and lower respiratory specimens during the acute phase of infection. The lowest concentration of SARS-CoV-2 viral copies this assay can detect is 250 copies / mL. A negative result does not preclude SARS-CoV-2 infection and should not be used as the sole basis for treatment or other patient management decisions.  A  negative result may occur with improper specimen collection / handling, submission of specimen other than nasopharyngeal swab, presence of viral mutation(s) within the areas targeted by this assay, and inadequate number of viral copies (<250 copies / mL). A negative result must be combined with clinical observations, patient history, and epidemiological information.  Fact Sheet for Patients:   StrictlyIdeas.no  Fact Sheet for Healthcare Providers: BankingDealers.co.za  This test is not yet approved or  cleared by the [place] FDA and has been authorized for detection and/or diagnosis of SARS-CoV-2 by FDA under an Emergency Use Authorization (EUA).  This EUA will remain in effect (meaning this test can be used) for the duration of the COVID-19 declaration under Section 564(b)(1) of the Act, 21 U.S.C. section 360bbb-3(b)(1), unless the authorization is terminated or revoked sooner.  Performed at [facility], [place] [address], [place], [place] [address]   MRSA PCR Screening     Status: None   Collection Time: 09/03/19  5:39 AM   Specimen: Nasopharyngeal  Result Value Ref Range Status   MRSA by PCR NEGATIVE NEGATIVE Final    Comment:        The GeneXpert MRSA Assay (FDA approved for NASAL specimens only), is one component of a comprehensive MRSA colonization surveillance program. It is not intended to diagnose MRSA infection nor to guide or monitor treatment for MRSA infections. Performed at [facility], [place] [address] [place], [place] [address]     Radiology Studies: DG Chest Portable 1 View  Result Date: 09/03/2019 CLINICAL DATA:  Shortness of breath EXAM: PORTABLE CHEST 1 VIEW COMPARISON:  06/29/2018 FINDINGS: Moderate cardiomegaly with moderate interstitial pulmonary edema and small pleural effusions. Status post TAVR. IMPRESSION: Moderate cardiomegaly and small pleural effusions with moderate interstitial  pulmonary edema. Electronically Signed   By: Ulyses Jarred M.D.   On: 09/03/2019 02:10   35 minutes with more than 50% spent in reviewing records, counseling patient/family and coordinating care.   Deziyah Arvin T. [place]  If 7PM-7AM, please contact night-coverage [email or web address] Password [facility] 09/03/2019, 2:07 PM

## 2019-09-03 NOTE — H&P (Signed)
History and Physical    Tanya [name] DXI:[id number] DOB: [birth year] DOA: 09/03/2019  PCP: Zara Chess, NP  Patient coming from: Home  I have personally briefly reviewed patient's old medical records in [place]  Chief Complaint: Respiratory distress  HPI: Tanya [name] is a 74 y.o. female with medical history significant of HTN, DM2, HLD,morbid obesity,PAF on eliquis, breast cancer s/p chemo and left radical mastectomy(1990s), ovarian cancer s/p chemo s/p radical hysterectomy (2010),left arm lymphedema,COPDon 2L 02, LBBB, OSA on CPAP,history of septic arthritis on chronic doxycycline andsevereaortic stenosiss/p TAVR (06/29/18).  Most recent echo in April 2021 showed: EF 76-73%, Grade 2 diastolic dysfunction, mod PAH, TAVR looked normal, and mild MS.  Pt presents to the ED today with gradual onset of SOB throughout the day, symptoms progressively worsened at night.  Home CPAP with minimal relief.  EMS called, O2 sats in the 80s.  EMS gave NTG and 2g magnesium.  No fevers, chills.  Not vaccinated against COVID, has mild cough.  Unsure about wt gain.   ED Course: Patient stabilized on BIPAP in ED.  COVID neg.  CXR reveals CHF findings: pulm edema, small B pleural effusions, etc.  BNP 316.6.  WBC 15.5k.  Given 40mg  Lasix IV.   Review of Systems: As per HPI, otherwise all review of systems negative.  Past Medical History:  Diagnosis Date  . Asthma   . Breast cancer in female [facility])    1994 s/p chemo and radical L mastectomy  . Diabetes ([place])   . DVT (deep venous thrombosis) (HCC)    in shoulder after shoulder replacement  . Full dentures   . GERD (gastroesophageal reflux disease)   . Gout   . H/O total shoulder replacement   . History of knee replacement, total, bilateral   . 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 now on daily doxycycline.  [place] HTN (hypertension)   . Lymphedema of left arm    s/p  mastectomy with lymph node removal for breast cancer  . Morbid obesity ([place])   . Neuropathy   . OSA on CPAP   . Ovarian cancer ([place])    2012 s/p chemo and radical hysterectomy  . PAF (paroxysmal atrial fibrillation) (HCC)    on Eliquis  . Pulmonary nodules    seen on pre TAVR CT scan. needs follow up CT in 06/2019  . S/P TAVR (transcatheter aortic valve replacement)    23 mm Edwards Sapien 3 transcatheter heart valve placed via percutaneous right transfemoral approach   . Severe aortic stenosis   . Urine incontinence     Past Surgical History:  Procedure Laterality Date  . MASTECTOMY Left 1993  . RIGHT/LEFT HEART CATH AND CORONARY ANGIOGRAPHY N/A 06/25/2018   Procedure: RIGHT/LEFT HEART CATH AND CORONARY ANGIOGRAPHY;  Surgeon: Belva Crome, MD;  Location: [facility];  Service: Cardiovascular;  Laterality: N/A;  . TEE WITHOUT CARDIOVERSION N/A 06/29/2018   Procedure: TRANSESOPHAGEAL ECHOCARDIOGRAM (TEE);  Surgeon: Sherren Mocha, MD;  Location: [facility];  Service: Open Heart Surgery;  Laterality: N/A;  . TRANSCATHETER AORTIC VALVE REPLACEMENT, TRANSFEMORAL N/A 06/29/2018   Procedure: TRANSCATHETER AORTIC VALVE REPLACEMENT, TRANSFEMORAL;  Surgeon: Sherren Mocha, MD;  Location: [facility];  Service: Open Heart Surgery;  Laterality: N/A;     reports that she has quit smoking. Her smoking use included cigarettes. She has never used smokeless tobacco. She reports that she does not drink alcohol and does not  use drugs.  Allergies  Allergen Reactions  . Fentanyl Anaphylaxis  . Pneumovax [Pneumococcal Polysaccharide Vaccine] Swelling    Arm swelling  . Sulfa Antibiotics Other (See Comments)    UNSPECIFIED CHILDHOOD REACTION  . Levaquin [Levofloxacin] Rash    Family History  Problem Relation Age of Onset  . Hypertension Mother   . Heart disease Father   . Renal cancer Brother      Prior to Admission medications   Medication Sig Start Date End Date Taking? Authorizing Provider    acetaminophen (TYLENOL) 325 MG tablet Take 650 mg by mouth every 6 (six) hours as needed for mild pain or fever.   Yes [provider]  apixaban (ELIQUIS) 5 MG TABS tablet Take 5 mg by mouth 2 (two) times daily.   Yes [provider]  atorvastatin (LIPITOR) 40 MG tablet Take 40 mg by mouth daily.   Yes [provider]  azelastine (ASTELIN) 0.1 % nasal spray Place 1 spray into both nostrils 2 (two) times daily. Use in each nostril as directed   Yes [provider]  calcium carbonate (OS-CAL - DOSED IN MG OF ELEMENTAL CALCIUM) 1250 (500 Ca) MG tablet Take 1 tablet by mouth 2 (two) times daily with a meal.   Yes [provider]  doxycycline (VIBRA-TABS) 100 MG tablet Take 100 mg by mouth 2 (two) times daily.   Yes [provider]  DULoxetine (CYMBALTA) 30 MG capsule Take 30 mg by mouth 2 (two) times daily.   Yes [provider]  esomeprazole (NEXIUM) 40 MG capsule Take 40 mg by mouth daily at 12 noon.   Yes [provider]  fexofenadine (ALLEGRA) 180 MG tablet Take 180 mg by mouth daily. 08/22/19  Yes [provider]  fluticasone furoate-vilanterol (BREO ELLIPTA) 200-25 MCG/INH AEPB Inhale 1 puff into the lungs daily.   Yes [provider]  furosemide (LASIX) 20 MG tablet Take 1 tablet (20 mg total) by mouth daily. 11/16/18  Yes Croitoru, Mihai, MD  metFORMIN (GLUCOPHAGE) 500 MG tablet Take 1,000 mg by mouth 2 (two) times daily with a meal.   Yes [provider]  metoprolol succinate (TOPROL-XL) 25 MG 24 hr tablet Take 25 mg by mouth daily.   Yes [provider]  montelukast (SINGULAIR) 10 MG tablet Take 10 mg by mouth at bedtime.   Yes [provider]  Multiple Vitamin (MULTIVITAMIN WITH MINERALS) TABS tablet Take 1 tablet by mouth daily.   Yes [provider]  potassium chloride (KLOR-CON) 10 MEQ tablet Take 1 tablet (10 mEq total) by mouth daily. 12/16/18  Yes Croitoru, Mihai, MD   sitaGLIPtin (JANUVIA) 50 MG tablet Take 50 mg by mouth daily.    Yes [provider]  vitamin E 100 UNIT capsule Take 100 Units by mouth daily.   Yes [provider]    Physical Exam: Vitals:   09/03/19 0345 09/03/19 0400 09/03/19 0415 09/03/19 0430  BP: 126/81 120/75 116/73 117/70  Pulse: 90 83 86 82  Resp: (!) 24 (!) 22 (!) 21 (!) 23  Temp:      TempSrc:      SpO2: 97% 97% 97% 98%  Weight:      Height:        Constitutional: NAD, calm, comfortable Eyes: PERRL, lids and conjunctivae normal ENMT: Mucous membranes are moist. Posterior pharynx clear of any exudate or lesions.Normal dentition.  Neck: normal, supple, no masses, no thyromegaly Respiratory: Rhonchi Cardiovascular: Regular rate and rhythm, no murmurs /  rubs / gallops. No extremity edema. 2+ pedal pulses. No carotid bruits.  Abdomen: no tenderness, no masses palpated. No hepatosplenomegaly. Bowel sounds positive.  Musculoskeletal: no clubbing / cyanosis. No joint deformity upper and lower extremities. Good ROM, no contractures. Normal muscle tone.  Skin: no rashes, lesions, ulcers. No induration Neurologic: CN 2-12 grossly intact. Sensation intact, DTR normal. Strength 5/5 in all 4.  Psychiatric: Normal judgment and insight. Alert and oriented x 3. Normal mood.    Labs on Admission: I have personally reviewed following labs and imaging studies  CBC: Recent Labs  Lab 09/03/19 0138  WBC 15.5*  NEUTROABS 6.5  HGB 13.9  HCT 43.7  MCV 97.5  PLT 295   Basic Metabolic Panel: Recent Labs  Lab 09/03/19 0138  NA 136  K 4.2  CL 100  CO2 23  GLUCOSE 374*  BUN 9  CREATININE 0.86  CALCIUM 8.8*  MG 2.5*   GFR: Estimated Creatinine Clearance: 49.6 mL/min (by C-G formula based on SCr of 0.86 mg/dL). Liver Function Tests: No results for input(s): AST, ALT, ALKPHOS, BILITOT, PROT, ALBUMIN in the last 168 hours. No results for input(s): LIPASE, AMYLASE in the last 168 hours. No results for  input(s): AMMONIA in the last 168 hours. Coagulation Profile: Recent Labs  Lab 09/03/19 0138  INR 1.1   Cardiac Enzymes: No results for input(s): CKTOTAL, CKMB, CKMBINDEX, TROPONINI in the last 168 hours. BNP (last 3 results) No results for input(s): PROBNP in the last 8760 hours. HbA1C: No results for input(s): HGBA1C in the last 72 hours. CBG: No results for input(s): GLUCAP in the last 168 hours. Lipid Profile: No results for input(s): CHOL, HDL, LDLCALC, TRIG, CHOLHDL, LDLDIRECT in the last 72 hours. Thyroid Function Tests: No results for input(s): TSH, T4TOTAL, FREET4, T3FREE, THYROIDAB in the last 72 hours. Anemia Panel: No results for input(s): VITAMINB12, FOLATE, FERRITIN, TIBC, IRON, RETICCTPCT in the last 72 hours. Urine analysis:    Component Value Date/Time   COLORURINE YELLOW 06/28/2018 1730   APPEARANCEUR CLEAR 06/28/2018 1730   LABSPEC 1.017 06/28/2018 1730   PHURINE 5.0 06/28/2018 1730   GLUCOSEU NEGATIVE 06/28/2018 1730   HGBUR NEGATIVE 06/28/2018 1730   BILIRUBINUR NEGATIVE 06/28/2018 1730   KETONESUR NEGATIVE 06/28/2018 1730   PROTEINUR NEGATIVE 06/28/2018 1730   NITRITE NEGATIVE 06/28/2018 1730   LEUKOCYTESUR TRACE (A) 06/28/2018 1730    Radiological Exams on Admission: DG Chest Portable 1 View  Result Date: 09/03/2019 CLINICAL DATA:  Shortness of breath EXAM: PORTABLE CHEST 1 VIEW COMPARISON:  06/29/2018 FINDINGS: Moderate cardiomegaly with moderate interstitial pulmonary edema and small pleural effusions. Status post TAVR. IMPRESSION: Moderate cardiomegaly and small pleural effusions with moderate interstitial pulmonary edema. Electronically Signed   By: Ulyses Jarred M.D.   On: 09/03/2019 02:10    EKG: Independently reviewed.  Assessment/Plan Principal Problem:   Acute on chronic diastolic CHF (congestive heart failure) (HCC) Active Problems:   History of septic arthritis   PAF (paroxysmal atrial fibrillation) (HCC)   HTN (hypertension)    Diabetes (HCC)   Acute on chronic respiratory failure with hypoxia (HCC)   Moderate pulmonary arterial systolic hypertension (HCC)   S/P TAVR (transcatheter aortic valve replacement)   Need for vaccination    1. Acute on chronic diastolic CHF causing acute on chronic hypoxic resp failure - 1. CHF pathway 2. Lasix 40mg  IV in ED 3. Lasix 40mg  IV BID 4. Strict intake and output 5. Daily BMP 6. Try to wean BIPAP as able 7. 2d  echo ordered 8. See HPI regarding prior cardiac history, TAVR, echo 2 months ago 2. PAF - 1. Cont metoprolol 2. Cont eliquis 3. H/o Septic arthritis - 1. Cont life long doxycycline 4. HTN - 1. Cont metoprolol 5. DM2 - 1. Hold home PO meds 2. Sensitive SSI Q4H while NPO 6. H/o breast CA in 90s, and ovarian CA in 2010 - both in remission as far as we know 7. Needs COVID vaccine - 1. Havent discussed with pt yet, but needs to get this ASAP once acute illness improved.  She would be very high risk for poor outcome if she got COVID.  DVT prophylaxis: Eliquis Code Status: Full Family Communication: No family in room Disposition Plan: Home after breathing improved and CHF worked up [employer] called: None Admission status: Place in [place] for now    Tanya [name], [place] [facility]  How to contact the [facility] Attending or Consulting provider [place] or covering provider during after hours [place], for this patient?  1. Check the care team in [facility] and look for a) attending/consulting [facility] provider listed and b) the [facility] team listed 2. Log into [email or web address]  Amion Physician Scheduling and messaging for groups and whole hospitals  On call and physician scheduling software for group practices, residents, hospitalists and other medical providers for call, clinic, rotation and shift schedules. On[employer] is a hospital-wide system for scheduling doctors and paging doctors on call. EasyPlot is for scientific plotting and data analysis.  [email or web address]  and use [facility]'s universal password to access. If you do not have the password, please contact the hospital operator.  3. Locate the [facility] provider you are looking for under [facility] and page to a number that you can be directly reached. 4. If you still have difficulty reaching the provider, please page the [facility] (Director on Call) for the Hospitalists listed on amion for assistance.  09/03/2019, 5:08 AM

## 2019-09-04 DIAGNOSIS — Z8739 Personal history of other diseases of the musculoskeletal system and connective tissue: Secondary | ICD-10-CM | POA: Diagnosis not present

## 2019-09-04 DIAGNOSIS — J9621 Acute and chronic respiratory failure with hypoxia: Secondary | ICD-10-CM | POA: Diagnosis not present

## 2019-09-04 DIAGNOSIS — E1159 Type 2 diabetes mellitus with other circulatory complications: Secondary | ICD-10-CM

## 2019-09-04 DIAGNOSIS — I340 Nonrheumatic mitral (valve) insufficiency: Secondary | ICD-10-CM

## 2019-09-04 DIAGNOSIS — I050 Rheumatic mitral stenosis: Secondary | ICD-10-CM

## 2019-09-04 DIAGNOSIS — I5033 Acute on chronic diastolic (congestive) heart failure: Secondary | ICD-10-CM | POA: Diagnosis not present

## 2019-09-04 LAB — RENAL FUNCTION PANEL
Albumin: 3.8 g/dL (ref 3.5–5.0)
Anion gap: 14 (ref 5–15)
BUN: 13 mg/dL (ref 8–23)
CO2: 26 mmol/L (ref 22–32)
Calcium: 9.1 mg/dL (ref 8.9–10.3)
Chloride: 99 mmol/L (ref 98–111)
Creatinine, Ser: 0.78 mg/dL (ref 0.44–1.00)
GFR calc Af Amer: >60 mL/min (ref >60)
GFR calc non Af Amer: >60 mL/min (ref >60)
Glucose, Bld: 173 mg/dL — ABNORMAL HIGH (ref 70–99)
Phosphorus: 5 mg/dL — ABNORMAL HIGH (ref 2.5–4.6)
Potassium: 3.7 mmol/L (ref 3.5–5.1)
Sodium: 139 mmol/L (ref 135–145)

## 2019-09-04 LAB — CBC
HCT: 37.8 % (ref 36.0–46.0)
Hemoglobin: 12.6 g/dL (ref 12.0–15.0)
MCH: 31.3 pg (ref 26.0–34.0)
MCHC: 33.3 g/dL (ref 30.0–36.0)
MCV: 94 fL (ref 80.0–100.0)
Platelets: 267 10*3/uL (ref 150–400)
RBC: 4.02 MIL/uL (ref 3.87–5.11)
RDW: 14.5 % (ref 11.5–15.5)
WBC: 8.9 10*3/uL (ref 4.0–10.5)
nRBC: 0 % (ref 0.0–0.2)

## 2019-09-04 LAB — GLUCOSE, CAPILLARY
Glucose-Capillary: 185 mg/dL — ABNORMAL HIGH (ref 70–99)
Glucose-Capillary: 220 mg/dL — ABNORMAL HIGH (ref 70–99)

## 2019-09-04 LAB — MAGNESIUM: Magnesium: 1.2 mg/dL — ABNORMAL LOW (ref 1.7–2.4)

## 2019-09-04 MED ORDER — FUROSEMIDE 40 MG PO TABS
ORAL_TABLET | ORAL | 1 refills | Status: DC
Start: 2019-09-04 — End: 2019-10-20

## 2019-09-04 MED ORDER — MAGNESIUM SULFATE 4 GM/100ML IV SOLN
4.0000 g | Freq: Once | INTRAVENOUS | Status: AC
  Administered 2019-09-04: 4 g via INTRAVENOUS
  Filled 2019-09-04: qty 100

## 2019-09-04 NOTE — Discharge Summary (Signed)
Physician Discharge Summary  Tanya [name] ZOX:[id number] DOB: 07/11/1945 DOA: 09/03/2019  PCP: Zara Chess, NP  Admit date: 09/03/2019 Discharge date: 09/04/2019  Admitted From: Home Disposition: Home  Recommendations for Outpatient Follow-up:  1. Follow ups as below. 2. Please obtain CBC/BMP/Mag at follow up 3. Please follow up on the following pending results: None  Home Health: None required Equipment/Devices: Portable oxygen  Discharge Condition: Stable CODE STATUS: Full code   Follow-up Information    Zara Chess, NP. Schedule an appointment as soon as possible for a visit in 1 week(s).   Specialty: Nurse Practitioner Contact information: [address] [place] [address] [phone]        Croitoru, Dani Gobble, MD. Schedule an appointment as soon as possible for a visit in 2 week(s).   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 fib on Eliquis, septic arthritis on chronic doxycycline, HTN, HLD, breast cancer s/p chemo and left radical mastectomy 1990s, ovarian cancer s/p radical hysterectomy in 2010, left arm lymphedema and debility presenting with progressive shortness of breath and hypoxemia in "80s" per EMS.  Admitted for acute on chronic respiratory failure due to acute on chronic diastolic CHF.  Started on IV diuretics and BiPAP.  Echocardiogram ordered.  She admits to missing some doses of her diuretics.  The next day, patient felt better.  She was continued on IV Lasix with further improvement in her breathing.  Echocardiogram with LVEF of 60 to 65%, moderate MVR, RVSP of 37 and G1 DD (LVEF of 50 to 55%, mild MVR with G2 DD about 2 months ago).   On the day of discharge, ambulated on room  air and desaturated to 87% requiring 2 L to recover.  Discharged on p.o. Lasix 40 mg daily with an option to take additional 40 as needed , and 2 L oxygen by [place] with ambulation/activity.   See individual problem list below for more on hospital course.  Discharge Diagnoses:  Acute on chronic respiratory failure with hypoxia-likely due to acute on chronic diastolic CHF.  Improved.  Liberated off oxygen at rest but required 2 L with ambulation.  Portable oxygen ordered on discharge.  Acute on chronic diastolic CHF: Echo with EF of 60 to 65%, RVSP of 37, Moderate MVR and G1-DD (LVEF of 50 to 55% and G2-DD enforcement).  Presented with SOB.  CXR consistent with CHF.  BNP slightly elevated.  Also slight troponin leak with non-ACS pattern.  Significant improvement with IV diuretics. -Discharged on p.o. Lasix 40 mg daily with an option to take additional 40 mg.  She was on 20 mg daily prior to discharge -Counseled on fluid and sodium restriction as below. -Recheck renal function and electrolytes at follow-up. -Adjust diuretics as appropriate.  Elevated troponin: Likely demand ischemia from CHF exacerbation.  EKG with chronic LBBB.  -Diuretics as above -Already on statin, Eliquis and beta-blocker  Mild mitral stenosis/moderate mitral valve regurgitation-previously reported as mild MVR. -Outpatient follow-up with cardiology.  AS s/p TAVR:  Normal structure and function on echo.  Paroxysmal A. fib: Rate controlled -Continue home metoprolol and Eliquis.  OSA on CPAP  History of septic arthritis: On chronic doxycycline -Continued on doxycycline  Uncontrolled DM-2 with hyperglycemia: A1c 7.4%.  Recent Labs  Lab 09/03/19 1110 09/03/19 1614 09/03/19 2114 09/04/19 0636 09/04/19 1134  GLUCAP 187* 184* 182* 185* 220*  -Discharged on home medications and statins.  Leukocytosis: Likely demargination.  No obvious signs of infection.  Resolved.  Hypomagnesemia -Replaced with 4 g of IV  magnesium prior to discharge  History of breast ca s/p chemo and left radical mastectomy 1990s, ovarian ca s/p radical hysterectomy in 2010 -Outpatient follow-up.  Family communication: Updated patient's daughter over the phone.  Body mass index is 40.88 kg/m.            Discharge Exam: Vitals:   09/04/19 0357 09/04/19 0745  BP: 128/60 113/60  Pulse: 77 77  Resp: 20 20  Temp: 98.1 F (36.7 C) 98.1 F (36.7 C)  SpO2: 100% 91%    GENERAL: No apparent distress.  Nontoxic.  Sitting on the edge of the bed. HEENT: MMM.  Vision and hearing grossly intact.  NECK: Supple.  No apparent JVD.  RESP: 92% on RA.  No IWOB.  Fair aeration bilaterally. CVS:  RRR. Heart sounds normal.  ABD/GI/GU: Bowel sounds present. Soft. Non tender.  MSK/EXT:  Moves extremities. No apparent deformity. No edema.  SKIN: no apparent skin lesion or wound NEURO: Awake, alert and oriented appropriately.  No apparent focal neuro deficit. PSYCH: Calm. Normal affect.  Discharge Instructions  Discharge Instructions    (HEART FAILURE PATIENTS) Call MD:  Anytime you have any of the following symptoms: 1) 3 pound weight gain in 24 hours or 5 pounds in 1 week 2) shortness of breath, with or without a dry hacking cough 3) swelling in the hands, feet or stomach 4) if you have to sleep on extra pillows at night in order to breathe.   Complete by: As directed    Call MD for:  difficulty breathing, headache or visual disturbances   Complete by: As directed    Call MD for:  persistant dizziness or light-headedness   Complete by: As directed    Diet - low sodium heart healthy   Complete by: As directed    Discharge instructions   Complete by: As directed    It has been a pleasure taking care of you!  You were hospitalized with shortness of breath likely due to heart failure exacerbation.  We treated you with intravenous Lasix to get fluid off your lungs.  With that, your symptoms improved to the point we think it  is safe to let you go home and follow-up with your primary care doctor and cardiologist.  We increased your Lasix to 40 mg daily with an option to take additional 40 mg as needed.   In addition to taking your medications as prescribed, we recommend you avoid alcoholic beverages or over-the-counter pain medications other than plain Tylenol, limit the amount of water/fluid you drink to less than 6 cups (1500 cc) a day,  limit your sodium (salt) intake to less than 2 g (2000 mg) a day, and weigh yourself daily at the same time and keeping your weight log.   We also encourage you to use your incentive spirometry, which will keep your lungs open and improve your oxygen level.  Use the oxygen only with activity! Is very important you always use your CPAP at night or when you sleep.   We may have made some changes to your home medications during this hospitalization. Please review your new medication list and the directions carefully before you take them.   Please go to your  hospital follow-up appointments or call to reschedule as recommended.   Take care,   Increase activity slowly   Complete by: As directed      Allergies as of 09/04/2019      Reactions   Fentanyl Anaphylaxis   Pneumovax [pneumococcal Polysaccharide Vaccine] Swelling   Arm swelling   Sulfa Antibiotics Other (See Comments)   UNSPECIFIED CHILDHOOD REACTION   Levaquin [levofloxacin] Rash      Medication List    TAKE these medications   acetaminophen 325 MG tablet Commonly known as: TYLENOL Take 650 mg by mouth every 6 (six) hours as needed for mild pain or fever.   atorvastatin 40 MG tablet Commonly known as: LIPITOR Take 40 mg by mouth daily.   azelastine 0.1 % nasal spray Commonly known as: ASTELIN Place 1 spray into both nostrils 2 (two) times daily. Use in each nostril as directed   Breo Ellipta 200-25 MCG/INH Aepb Generic drug: fluticasone furoate-vilanterol Inhale 1 puff into the lungs daily.   calcium  carbonate 1250 (500 Ca) MG tablet Commonly known as: OS-CAL - dosed in mg of elemental calcium Take 1 tablet by mouth 2 (two) times daily with a meal.   doxycycline 100 MG tablet Commonly known as: VIBRA-TABS Take 100 mg by mouth 2 (two) times daily.   DULoxetine 30 MG capsule Commonly known as: CYMBALTA Take 30 mg by mouth 2 (two) times daily.   Eliquis 5 MG Tabs tablet Generic drug: apixaban Take 5 mg by mouth 2 (two) times daily.   esomeprazole 40 MG capsule Commonly known as: NEXIUM Take 40 mg by mouth daily at 12 noon.   fexofenadine 180 MG tablet Commonly known as: ALLEGRA Take 180 mg by mouth daily.   furosemide 40 MG tablet Commonly known as: Lasix Take 1 tablet (40 mg) in the morning. May take additional 1 tablet in the afternoon as needed for leg swelling, SOB and/or > 2 lbs weight gain. What changed:   medication strength  how much to take  how to take this  when to take this  additional instructions   metFORMIN 500 MG tablet Commonly known as: GLUCOPHAGE Take 1,000 mg by mouth 2 (two) times daily with a meal.   metoprolol succinate 25 MG 24 hr tablet Commonly known as: TOPROL-XL Take 25 mg by mouth daily.   montelukast 10 MG tablet Commonly known as: SINGULAIR Take 10 mg by mouth at bedtime.   multivitamin with minerals Tabs tablet Take 1 tablet by mouth daily.   potassium chloride 10 MEQ tablet Commonly known as: KLOR-CON Take 1 tablet (10 mEq total) by mouth daily.   sitaGLIPtin 50 MG tablet Commonly known as: JANUVIA Take 50 mg by mouth daily.   vitamin E 45 MG (100 UNITS) capsule Take 100 Units by mouth daily.       Consultations:  None  Procedures/Studies:  2D Echo on 09/02/1929 1. Left ventricular ejection fraction, by estimation, is 55 to 60%. The  left ventricle has normal function. The left ventricle has no regional  wall motion abnormalities. There is mild concentric left ventricular  hypertrophy. Left ventricular  diastolic  parameters are consistent with Grade II diastolic dysfunction  (pseudonormalization). Elevated left ventricular end-diastolic pressure.  2. Right ventricular systolic function is normal. The right ventricular  size is normal. There is moderately elevated pulmonary artery systolic  pressure.  3. Left atrial size was mild to moderately dilated.  4. Right atrial size was mildly dilated.  5. The mitral valve is  degenerative. Mild mitral valve regurgitation.  Mild mitral stenosis.  6. The aortic valve has been repaired/replaced. Aortic valve  regurgitation is not visualized. Mild aortic valve sclerosis is present,  with no evidence of aortic valve stenosis. There is a 23 mm Edwards Sapien  prosthetic (TAVR) valve present in the  aortic position. Procedure Date: 06/2018. Echo findings are consistent  with normal structure and function of the aortic valve prosthesis. Aortic  valve area, by VTI measures 1.06 cm. Aortic valve mean gradient measures  19.0 mmHg. Aortic valve Vmax  measures 2.80 m/s.  7. The inferior vena cava is normal in size with greater than 50%  respiratory variability, suggesting right atrial pressure of 3 mmHg.   Comparison(s): No significant change from prior study.    DG Chest Portable 1 View  Result Date: 09/03/2019 CLINICAL DATA:  Shortness of breath EXAM: PORTABLE CHEST 1 VIEW COMPARISON:  06/29/2018 FINDINGS: Moderate cardiomegaly with moderate interstitial pulmonary edema and small pleural effusions. Status post TAVR. IMPRESSION: Moderate cardiomegaly and small pleural effusions with moderate interstitial pulmonary edema. Electronically Signed   By: Ulyses Jarred M.D.   On: 09/03/2019 02:10   ECHOCARDIOGRAM COMPLETE  Result Date: 09/03/2019    ECHOCARDIOGRAM REPORT   Patient Name:   Tanya [name] Date of Exam: 09/03/2019 Medical Rec #:  [id number]     Height:       56.0 in Accession #:    [id number]    Weight:       186.3 lb Date of Birth:  December 04, 1945       BSA:          1.722 m Patient Age:    74 years      BP:           121/67 mmHg Patient Gender: F             HR:           71 bpm. Exam Location:  Inpatient Procedure: 2D Echo, Cardiac Doppler and Color Doppler Indications:    CHF-Acute Diastolic  History:        Patient has prior history of Echocardiogram examinations, most                 recent 06/29/2019. CHF, Aortic Valve Disease and Mitral Valve                 Disease, Arrythmias:Atrial Fibrillation; Risk                 Factors:Hypertension, Diabetes, Former Smoker and Sleep Apnea.                 S/p TAVR.                 Aortic Valve: 23 mm Edwards Sapien prosthetic, stented (TAVR)                 valve is present in the aortic position.  Sonographer:    Clayton Lefort RDCS (AE) Referring Phys: [place]  1. Left ventricular ejection fraction, by estimation, is 60 to 65%. The left ventricle has normal function. The left ventricle has no regional wall motion abnormalities. There is severe left ventricular hypertrophy. Left ventricular diastolic parameters  are consistent with Grade I diastolic dysfunction (impaired relaxation).  2. Right ventricular systolic function is normal. The right ventricular size is normal. Mildly increased right ventricular wall thickness. There is mildly elevated pulmonary artery systolic pressure. The estimated right ventricular systolic  pressure is 37.3 mmHg.  3. Left atrial size was mildly dilated.  4. The mitral valve is abnormal, thickened and calcified with marked annular calcification. Moderate mitral valve regurgitation. The mean mitral valve gradient is 4.0 mmHg suggesting potential mild stenosis.  5. Tricuspid valve regurgitation is mild to moderate.  6. The aortic valve has been repaired/replaced. Aortic valve regurgitation is not visualized. There is a 23 mm Edwards Sapien prosthetic (TAVR) valve present in the aortic position. no paravalvular leak. Aortic valve mean gradient measures 14.0 mmHg  (stable).  7. The inferior vena cava is normal in size with greater than 50% respiratory variability, suggesting right atrial pressure of 3 mmHg. FINDINGS  Left Ventricle: Left ventricular ejection fraction, by estimation, is 60 to 65%. The left ventricle has normal function. The left ventricle has no regional wall motion abnormalities. The left ventricular internal cavity size was normal in size. There is  severe left ventricular hypertrophy. Left ventricular diastolic parameters are consistent with Grade I diastolic dysfunction (impaired relaxation). Right Ventricle: The right ventricular size is normal. Mildly increased right ventricular wall thickness. Right ventricular systolic function is normal. There is mildly elevated pulmonary artery systolic pressure. The tricuspid regurgitant velocity is 2.93 m/s, and with an assumed right atrial pressure of 3 mmHg, the estimated right ventricular systolic pressure is 96.7 mmHg. Left Atrium: Left atrial size was mildly dilated. Right Atrium: Right atrial size was normal in size. Pericardium: There is no evidence of pericardial effusion. Presence of pericardial fat pad. Mitral Valve: The mitral valve is abnormal. Severe mitral annular calcification. Moderate mitral valve regurgitation. MV peak gradient, 10.0 mmHg. The mean mitral valve gradient is 4.0 mmHg. Tricuspid Valve: The tricuspid valve is grossly normal. Tricuspid valve regurgitation is mild to moderate. Aortic Valve: The aortic valve has been repaired/replaced. Aortic valve regurgitation is not visualized. Aortic valve mean gradient measures 14.0 mmHg. Aortic valve peak gradient measures 24.6 mmHg. Aortic valve area, by VTI measures 1.54 cm. There is a  23 mm Edwards Sapien prosthetic, stented (TAVR) valve present in the aortic position. Pulmonic Valve: The pulmonic valve was grossly normal. Pulmonic valve regurgitation is trivial. Aorta: The aortic root is normal in size and structure. Venous: The inferior vena  cava is normal in size with greater than 50% respiratory variability, suggesting right atrial pressure of 3 mmHg. IAS/Shunts: No atrial level shunt detected by color flow Doppler.  LEFT VENTRICLE PLAX 2D LVIDd:         4.00 cm LVIDs:         2.60 cm LV PW:         1.90 cm LV IVS:        1.80 cm LVOT diam:     2.30 cm LV SV:         76 LV SV Index:   44 LVOT Area:     4.15 cm  RIGHT VENTRICLE             IVC RV Basal diam:  3.00 cm     IVC diam: 1.40 cm RV S prime:     13.40 cm/s TAPSE (M-mode): 1.9 cm LEFT ATRIUM             Index       RIGHT ATRIUM           Index LA diam:        3.50 cm 2.03 cm/m  RA Area:     14.20 cm LA Vol (A2C):   38.7 ml 22.47  ml/m RA Volume:   32.80 ml  19.04 ml/m LA Vol (A4C):   53.2 ml 30.89 ml/m LA Biplane Vol: 49.1 ml 28.51 ml/m  AORTIC VALVE AV Area (Vmax):    1.55 cm AV Area (Vmean):   1.42 cm AV Area (VTI):     1.54 cm AV Vmax:           248.00 cm/s AV Vmean:          174.750 cm/s AV VTI:            0.493 m AV Peak Grad:      24.6 mmHg AV Mean Grad:      14.0 mmHg LVOT Vmax:         92.50 cm/s LVOT Vmean:        59.580 cm/s LVOT VTI:          0.182 m LVOT/AV VTI ratio: 0.37  AORTA Ao Root diam: 3.00 cm Ao Asc diam:  3.20 cm MITRAL VALVE            TRICUSPID VALVE MV Peak grad: 10.0 mmHg TR Peak grad:   34.3 mmHg MV Mean grad: 4.0 mmHg  TR Vmax:        293.00 cm/s MV Vmax:      1.58 m/s MV Vmean:     93.6 cm/s SHUNTS                         Systemic VTI:  0.18 m                         Systemic Diam: 2.30 cm Rozann Lesches MD Electronically signed by Rozann Lesches MD Signature Date/Time: 09/03/2019/2:22:10 PM    Final         The results of significant diagnostics from this hospitalization (including imaging, microbiology, ancillary and laboratory) are listed below for reference.     Microbiology: Recent Results (from the past 240 hour(s))  SARS Coronavirus 2 by RT PCR (hospital order, performed in [facility] hospital lab) Nasopharyngeal Nasopharyngeal Swab      Status: None   Collection Time: 09/03/19  1:39 AM   Specimen: Nasopharyngeal Swab  Result Value Ref Range Status   SARS Coronavirus 2 NEGATIVE NEGATIVE Final    Comment: (NOTE) SARS-CoV-2 target nucleic acids are NOT DETECTED.  The SARS-CoV-2 RNA is generally detectable in upper and lower respiratory specimens during the acute phase of infection. The lowest concentration of SARS-CoV-2 viral copies this assay can detect is 250 copies / mL. A negative result does not preclude SARS-CoV-2 infection and should not be used as the sole basis for treatment or other patient management decisions.  A negative result may occur with improper specimen collection / handling, submission of specimen other than nasopharyngeal swab, presence of viral mutation(s) within the areas targeted by this assay, and inadequate number of viral copies (<250 copies / mL). A negative result must be combined with clinical observations, patient history, and epidemiological information.  Fact Sheet for Patients:   StrictlyIdeas.no  Fact Sheet for Healthcare Providers: BankingDealers.co.za  This test is not yet approved or  cleared by the [place] FDA and has been authorized for detection and/or diagnosis of SARS-CoV-2 by FDA under an Emergency Use Authorization (EUA).  This EUA will remain in effect (meaning this test can be used) for the duration of the COVID-19 declaration under Section 564(b)(1) of the Act, 21 U.S.C. section 360bbb-3(b)(1), unless the  authorization is terminated or revoked sooner.  Performed at [facility], [place] [address] [place], [place] [address]   MRSA PCR Screening     Status: None   Collection Time: 09/03/19  5:39 AM   Specimen: Nasopharyngeal  Result Value Ref Range Status   MRSA by PCR NEGATIVE NEGATIVE Final    Comment:        The GeneXpert MRSA Assay (FDA approved for NASAL specimens only), is one component of  a comprehensive MRSA colonization surveillance program. It is not intended to diagnose MRSA infection nor to guide or monitor treatment for MRSA infections. Performed at [facility], [place] [address], [place], [place] [address]      Labs: BNP (last 3 results) Recent Labs    09/03/19 0138  BNP 831.5*   Basic Metabolic Panel: Recent Labs  Lab 09/03/19 0138 09/04/19 0129  NA 136 139  K 4.2 3.7  CL 100 99  CO2 23 26  GLUCOSE 374* 173*  BUN 9 13  CREATININE 0.86 0.78  CALCIUM 8.8* 9.1  MG 2.5* 1.2*  PHOS  --  5.0*   Liver Function Tests: Recent Labs  Lab 09/04/19 0129  ALBUMIN 3.8   No results for input(s): LIPASE, AMYLASE in the last 168 hours. No results for input(s): AMMONIA in the last 168 hours. CBC: Recent Labs  Lab 09/03/19 0138 09/04/19 0129  WBC 15.5* 8.9  NEUTROABS 6.5  --   HGB 13.9 12.6  HCT 43.7 37.8  MCV 97.5 94.0  PLT 379 267   Cardiac Enzymes: No results for input(s): CKTOTAL, CKMB, CKMBINDEX, TROPONINI in the last 168 hours. BNP: Invalid input(s): POCBNP CBG: Recent Labs  Lab 09/03/19 1044 09/03/19 1110 09/03/19 1614 09/03/19 2114 09/04/19 0636  GLUCAP 188* 187* 184* 182* 185*   D-Dimer No results for input(s): DDIMER in the last 72 hours. Hgb A1c Recent Labs    09/03/19 0940  HGBA1C 7.4*   Lipid Profile No results for input(s): CHOL, HDL, LDLCALC, TRIG, CHOLHDL, LDLDIRECT in the last 72 hours. Thyroid function studies No results for input(s): TSH, T4TOTAL, T3FREE, THYROIDAB in the last 72 hours.  Invalid input(s): FREET3 Anemia work up No results for input(s): VITAMINB12, FOLATE, FERRITIN, TIBC, IRON, RETICCTPCT in the last 72 hours. Urinalysis    Component Value Date/Time   COLORURINE YELLOW 06/28/2018 1730   APPEARANCEUR CLEAR 06/28/2018 1730   LABSPEC 1.017 06/28/2018 1730   PHURINE 5.0 06/28/2018 1730   GLUCOSEU NEGATIVE 06/28/2018 1730   HGBUR NEGATIVE 06/28/2018 1730   BILIRUBINUR NEGATIVE 06/28/2018  1730   KETONESUR NEGATIVE 06/28/2018 1730   PROTEINUR NEGATIVE 06/28/2018 1730   NITRITE NEGATIVE 06/28/2018 1730   LEUKOCYTESUR TRACE (A) 06/28/2018 1730   Sepsis Labs Invalid input(s): PROCALCITONIN,  WBC,  LACTICIDVEN   Time coordinating discharge: 40 minutes  SIGNED:  Mercy Riding, MD  [facility] 09/04/2019, 10:02 AM  If 7PM-7AM, please contact night-coverage [email or web address] Password [email or web address]

## 2019-09-04 NOTE — Progress Notes (Signed)
SATURATION QUALIFICATIONS: (This note is used to comply with regulatory documentation for home oxygen)  Patient Saturations on Room Air at Rest = 92%  Patient Saturations on Room Air while Ambulating = 87%  Patient Saturations on 2 Liters of oxygen while Ambulating = 92%  Please briefly explain why patient needs home oxygen:Pt with desaturation on RA with gait and requires 2L with activity to maintain SpO2 >90% Tanya [name] P, PT Acute Rehabilitation Services Pager: [phone] Office: [phone]

## 2019-09-04 NOTE — Care Management (Signed)
Spoke w patient. She states that she uses [employer] for HS, PRN home O2. She has tank for transport that family will bring for DC. Requested DME O2 order from Dr Cyndia Skeeters so patient could additional home oxygen resources from [employer]. Current home set up is acceptable for DC.  Will notify [employer] when order received.

## 2019-09-04 NOTE — Evaluation (Signed)
Occupational Therapy Evaluation Patient Details Name: Tanya [name] MRN: [id number] DOB: 02/13/1946 Today's Date: 09/04/2019    History of Present Illness 74 yo admitted with acute respiratory failure due to acute CHF with missed diuretics. 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 All education has been completed and the patient has no further questions.Pt is able to perform ADLs at supervision - min A level, and is approaching her baseline level of functioning.  She lives with her daughter and son in law who can assist as needed. See below for any follow-up Occupational Therapy or equipment needs. OT is signing off. Thank you for this referral.      Follow Up Recommendations  No OT follow up;Supervision - Intermittent    Equipment Recommendations  None recommended by OT    Recommendations for Other Services       Precautions / Restrictions Precautions Precautions: Fall Precaution Comments: h/o falls most recent being several months ago       Mobility Bed Mobility               General bed mobility comments: EOB on arrival  Transfers Overall transfer level: Modified independent                    Balance Overall balance assessment: Mild deficits observed, not formally tested;History of Falls                                         ADL either performed or assessed with clinical judgement   ADL Overall ADL's : Needs assistance/impaired Eating/Feeding: Independent   Grooming: Wash/dry hands;Wash/dry face;Oral care;Brushing hair;Supervision/safety;Standing   Upper Body Bathing: Set up;Sitting   Lower Body Bathing: Supervison/ safety;Sit to/from stand   Upper Body Dressing : Set up;Standing   Lower Body Dressing: Minimal assistance;Sit to/from  stand Lower Body Dressing Details (indicate cue type and reason): assist for socks. She reports she rarely wears socks and is familiar with sock aid  Toilet Transfer: Supervision/safety;Ambulation;Comfort height toilet;BSC;RW   Toileting- Clothing Manipulation and Hygiene: Supervision/safety;Sit to/from stand       Functional mobility during ADLs: Supervision/safety       Vision Patient Visual Report: No change from baseline       Perception     Praxis      Pertinent Vitals/Pain Pain Assessment: No/denies pain     Hand Dominance Right   Extremity/Trunk Assessment Upper Extremity Assessment Upper Extremity Assessment: LUE deficits/detail LUE Deficits / Details: limited shoulder ROM due to h/o TSA.  Pt also with lymphedema Lt UE which she reports has been present and stable since the 1990s    Lower Extremity Assessment Lower Extremity Assessment: Generalized weakness   Cervical / Trunk Assessment Cervical / Trunk Assessment: Kyphotic   Communication Communication Communication: No difficulties   Cognition Arousal/Alertness: Awake/alert Behavior During Therapy: WFL for tasks assessed/performed Overall Cognitive Status: Within Functional Limits for tasks assessed                                     General Comments  02 sats dropped to 84% with pt on RA while sitting EOB  and conversing.  02 reapplied with sats improving to 97%     Exercises Exercises: Other exercises Other Exercises Other Exercises: reviewed energy conservation strategies, most of which pt has been implementing    Shoulder Instructions      Home Living Family/patient expects to be discharged to:: Private residence Living Arrangements: Children Available Help at Discharge: Family;Available 24 hours/day Type of Home: House Home Access: Stairs to enter [employer] of Steps: 2 Entrance Stairs-Rails: None Home Layout: Two level Alternate Level Stairs-Number of Steps:  stair lift   Bathroom Shower/Tub: Teacher, early years/pre: Handicapped height Bathroom Accessibility: Yes   Home Equipment: Environmental consultant - 2 wheels;Cane - single point;Bedside commode;Walker - 4 wheels;Other (comment);Transport chair;Tub bench   Additional Comments: Pt lives with daughter and son in law       Prior Functioning/Environment Level of Independence: Independent with assistive device(s);Needs assistance  Gait / Transfers Assistance Needed: ambulates with RW  ADL's / Homemaking Assistance Needed: Pt requires occasional assist stepping into tub (chooses not to use tub transfer bench at times), and requires assist with socks    Comments: needs occasional assist getting into tub         OT Problem List: Decreased strength;Decreased activity tolerance;Cardiopulmonary status limiting activity      OT Treatment/Interventions:      OT Goals(Current goals can be found in the care plan section) Acute Rehab OT Goals Patient Stated Goal: to go home today  OT Goal Formulation: All assessment and education complete, DC therapy  OT Frequency:     Barriers to D/C:            Co-evaluation              AM-PAC OT "6 Clicks" Daily Activity     Outcome Measure Help from another person eating meals?: None Help from another person taking care of personal grooming?: A Little Help from another person toileting, which includes using toliet, bedpan, or urinal?: A Little Help from another person bathing (including washing, rinsing, drying)?: A Little Help from another person to put on and taking off regular upper body clothing?: A Little Help from another person to put on and taking off regular lower body clothing?: A Little 6 Click Score: 19   End of Session Equipment Utilized During Treatment: Rolling walker;Oxygen Nurse Communication: Mobility status  Activity Tolerance: Patient tolerated treatment well Patient left: in bed;with call bell/phone within reach  OT  Visit Diagnosis: Unsteadiness on feet (R26.81)                Time: 7121-9758 OT Time Calculation (min): 20 min Charges:  OT General Charges $OT Visit: 1 Visit OT Evaluation $OT Eval Moderate Complexity: 1 Mod  Nilsa Nutting., OTR/L Acute Rehabilitation Services Pager [phone] Office [phone]   Lucille Passy M 09/04/2019, 11:39 AM

## 2019-09-04 NOTE — Evaluation (Signed)
Physical Therapy Evaluation Patient Details Name: Tanya [name] MRN: [id number] DOB: 1945/10/13 Today's Date: 09/04/2019   History of Present Illness  74 yo admitted with acute respiratory failure due to acute CHF with missed diuretics. 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 Pt lives with daughter and has chair lift to get to her bedroom and son-in-law works from home and helps her up when she falls (2x recently with tripping over items). Pt with decreased activity tolerance and current need for supplemental O2 with gait with education for walking program. Pt will benefit from acute therapy to maximize safety and independence to decrease burden of care. Pt has pulse ox at home and encouraged continued use as well as daily walking program.   SpO2 92% on RA rest 87% on RA with gait 92% on 2L with gait    Follow Up Recommendations No PT follow up    Equipment Recommendations  None recommended by PT    Recommendations for Other Services       Precautions / Restrictions Precautions Precautions: Fall      Mobility  Bed Mobility               General bed mobility comments: EOB on arrival  Transfers Overall transfer level: Modified independent                  Ambulation/Gait Ambulation/Gait assistance: Supervision Gait Distance (Feet): 300 Feet Assistive device: Rolling walker (2 wheeled) Gait Pattern/deviations: Step-through pattern;Decreased stride length   Gait velocity interpretation: >2.62 ft/sec, indicative of community ambulatory General Gait Details: cues for breathing technique and safety with pt with desaturation to 87% on RA and required 2L for gait, cues for direction  Stairs            Wheelchair Mobility    Modified Rankin (Stroke Patients  Only)       Balance Overall balance assessment: Mild deficits observed, not formally tested;History of Falls                                           Pertinent Vitals/Pain Pain Assessment: No/denies pain    Home Living Family/patient expects to be discharged to:: Private residence Living Arrangements: Children Available Help at Discharge: Family;Available 24 hours/day Type of Home: House Home Access: Stairs to enter   [employer] of Steps: 2 Home Layout: Two level Home Equipment: Walker - 2 wheels;Cane - single point;Bedside commode;Walker - 4 wheels;Other (comment);Transport chair (stair lift)      Prior Function Level of Independence: Independent with assistive device(s)         Comments: uses cane inside and RW outside     Hand Dominance        Extremity/Trunk Assessment   Upper Extremity Assessment Upper Extremity Assessment: Generalized weakness (limited left shoulder ROM prior TSA)    Lower Extremity Assessment Lower Extremity Assessment: Generalized weakness    Cervical / Trunk Assessment Cervical / Trunk Assessment: Kyphotic  Communication   Communication: No difficulties  Cognition Arousal/Alertness: Awake/alert Behavior During Therapy: WFL for tasks assessed/performed Overall Cognitive Status: Within Functional Limits for tasks assessed  General Comments      Exercises     Assessment/Plan    PT Assessment Patient needs continued PT services  PT Problem List Decreased mobility;Decreased activity tolerance;Decreased balance;Decreased knowledge of use of DME;Cardiopulmonary status limiting activity       PT Treatment Interventions DME instruction;Therapeutic activities;Gait training;Therapeutic exercise;Patient/family education;Balance training;Functional mobility training    PT Goals (Current goals can be found in the Care Plan section)  Acute Rehab PT  Goals Patient Stated Goal: return home PT Goal Formulation: With patient Time For Goal Achievement: 09/18/19 Potential to Achieve Goals: Good    Frequency Min 3X/week   Barriers to discharge        Co-evaluation               AM-PAC PT "6 Clicks" Mobility  Outcome Measure Help needed turning from your back to your side while in a flat bed without using bedrails?: None Help needed moving from lying on your back to sitting on the side of a flat bed without using bedrails?: None Help needed moving to and from a bed to a chair (including a wheelchair)?: A Little Help needed standing up from a chair using your arms (e.g., wheelchair or bedside chair)?: A Little Help needed to walk in hospital room?: A Little Help needed climbing 3-5 steps with a railing? : A Little 6 Click Score: 20    End of Session   Activity Tolerance: Patient tolerated treatment well Patient left: in chair;with call bell/phone within reach Nurse Communication: Mobility status PT Visit Diagnosis: Other abnormalities of gait and mobility (R26.89)    Time: 0240-9735 PT Time Calculation (min) (ACUTE ONLY): 27 min   Charges:   PT Evaluation $PT Eval Moderate Complexity: 1 Mod          Aneesah Hernan P, PT Acute Rehabilitation Services Pager: [phone] Office: [place] 09/04/2019, 9:40 AM

## 2019-09-04 NOTE — Care Management Obs Status (Signed)
[facility] NOTIFICATION   Patient Details  Name: Tanya [name] MRN: [id number] Date of Birth: Aug 07, 1945   Medicare Observation Status Notification Given:  Yes    Carles Collet, RN 09/04/2019, 10:25 AM

## 2019-09-05 ENCOUNTER — Emergency Department (HOSPITAL_COMMUNITY): Payer: Medicare Other

## 2019-09-05 ENCOUNTER — Emergency Department (HOSPITAL_COMMUNITY)
Admission: EM | Admit: 2019-09-05 | Discharge: 2019-09-05 | Disposition: A | Discharge status: Home / Self Care | Payer: Medicare Other | Attending: Emergency Medicine | Admitting: Emergency Medicine

## 2019-09-05 ENCOUNTER — Other Ambulatory Visit: Payer: Self-pay

## 2019-09-05 DIAGNOSIS — I5032 Chronic diastolic (congestive) heart failure: Secondary | ICD-10-CM | POA: Diagnosis not present

## 2019-09-05 DIAGNOSIS — Z7901 Long term (current) use of anticoagulants: Secondary | ICD-10-CM | POA: Diagnosis not present

## 2019-09-05 DIAGNOSIS — Z79899 Other long term (current) drug therapy: Secondary | ICD-10-CM | POA: Diagnosis not present

## 2019-09-05 DIAGNOSIS — Z853 Personal history of malignant neoplasm of breast: Secondary | ICD-10-CM | POA: Diagnosis not present

## 2019-09-05 DIAGNOSIS — I110 Hypertensive heart disease with heart failure: Secondary | ICD-10-CM | POA: Diagnosis not present

## 2019-09-05 DIAGNOSIS — E119 Type 2 diabetes mellitus without complications: Secondary | ICD-10-CM | POA: Diagnosis not present

## 2019-09-05 DIAGNOSIS — I4891 Unspecified atrial fibrillation: Secondary | ICD-10-CM | POA: Diagnosis not present

## 2019-09-05 DIAGNOSIS — Z87891 Personal history of nicotine dependence: Secondary | ICD-10-CM | POA: Diagnosis not present

## 2019-09-05 DIAGNOSIS — Z8541 Personal history of malignant neoplasm of cervix uteri: Secondary | ICD-10-CM | POA: Diagnosis not present

## 2019-09-05 DIAGNOSIS — Z86718 Personal history of other venous thrombosis and embolism: Secondary | ICD-10-CM | POA: Insufficient documentation

## 2019-09-05 DIAGNOSIS — Z7984 Long term (current) use of oral hypoglycemic drugs: Secondary | ICD-10-CM | POA: Insufficient documentation

## 2019-09-05 LAB — COMPREHENSIVE METABOLIC PANEL
ALT: 39 U/L (ref 0–44)
AST: 61 U/L — ABNORMAL HIGH (ref 15–41)
Albumin: 4.1 g/dL (ref 3.5–5.0)
Alkaline Phosphatase: 48 U/L (ref 38–126)
Anion gap: 16 — ABNORMAL HIGH (ref 5–15)
BUN: 19 mg/dL (ref 8–23)
CO2: 24 mmol/L (ref 22–32)
Calcium: 8.8 mg/dL — ABNORMAL LOW (ref 8.9–10.3)
Chloride: 97 mmol/L — ABNORMAL LOW (ref 98–111)
Creatinine, Ser: 1.19 mg/dL — ABNORMAL HIGH (ref 0.44–1.00)
GFR calc Af Amer: 51 mL/min — ABNORMAL LOW (ref >60)
GFR calc non Af Amer: 44 mL/min — ABNORMAL LOW (ref >60)
Glucose, Bld: 265 mg/dL — ABNORMAL HIGH (ref 70–99)
Potassium: 3.9 mmol/L (ref 3.5–5.1)
Sodium: 137 mmol/L (ref 135–145)
Total Bilirubin: 1.2 mg/dL (ref 0.3–1.2)
Total Protein: 7 g/dL (ref 6.5–8.1)

## 2019-09-05 LAB — CBC
HCT: 41 % (ref 36.0–46.0)
Hemoglobin: 13.1 g/dL (ref 12.0–15.0)
MCH: 30.8 pg (ref 26.0–34.0)
MCHC: 32 g/dL (ref 30.0–36.0)
MCV: 96.2 fL (ref 80.0–100.0)
Platelets: 329 10*3/uL (ref 150–400)
RBC: 4.26 MIL/uL (ref 3.87–5.11)
RDW: 14.5 % (ref 11.5–15.5)
WBC: 10.5 10*3/uL (ref 4.0–10.5)
nRBC: 0 % (ref 0.0–0.2)

## 2019-09-05 LAB — I-STAT CHEM 8, ED
BUN: 21 mg/dL (ref 8–23)
Calcium, Ion: 1.01 mmol/L — ABNORMAL LOW (ref 1.15–1.40)
Chloride: 96 mmol/L — ABNORMAL LOW (ref 98–111)
Creatinine, Ser: 1.2 mg/dL — ABNORMAL HIGH (ref 0.44–1.00)
Glucose, Bld: 262 mg/dL — ABNORMAL HIGH (ref 70–99)
HCT: 39 % (ref 36.0–46.0)
Hemoglobin: 13.3 g/dL (ref 12.0–15.0)
Potassium: 3.9 mmol/L (ref 3.5–5.1)
Sodium: 137 mmol/L (ref 135–145)
TCO2: 27 mmol/L (ref 22–32)

## 2019-09-05 LAB — PATHOLOGIST SMEAR REVIEW

## 2019-09-05 LAB — MAGNESIUM: Magnesium: 1.6 mg/dL — ABNORMAL LOW (ref 1.7–2.4)

## 2019-09-05 MED ORDER — METOPROLOL SUCCINATE ER 50 MG PO TB24
50.0000 mg | ORAL_TABLET | Freq: Every day | ORAL | 0 refills | Status: DC
Start: 2019-09-05 — End: 2019-09-07

## 2019-09-05 MED ORDER — ETOMIDATE 2 MG/ML IV SOLN
INTRAVENOUS | Status: AC
  Filled 2019-09-05: qty 10

## 2019-09-05 MED ORDER — ETOMIDATE 2 MG/ML IV SOLN
INTRAVENOUS | Status: AC | PRN
  Administered 2019-09-05: 10 mg via INTRAVENOUS

## 2019-09-05 NOTE — ED Notes (Signed)
MD at bedside. Cardioversion planned. RT notified, equipment being brought to bedside.

## 2019-09-05 NOTE — ED Notes (Signed)
All discharge instructions reviewed with pt. No questions verbalized. Pt discharged without issue.

## 2019-09-05 NOTE — ED Triage Notes (Signed)
Pt arrives via [facility] stretcher with c/o afib RVR. Pt was awoken from sleep and called 911. Per EMS, pt was in wide complex tachycardia on first arrival. Given 10 mg cardizem. Pt then noted to be in SVT. EMS administered 3 rounds of adenosine 08/20/10 mg with approx 30 sec change to NSR afterwards. After third dose pt was noted to be in a-flutter. Of note pt was here on Friday for CHF exacerbation.

## 2019-09-05 NOTE — Sedation Documentation (Signed)
Pt synchronized cardioversion to 120 J.   HR noted to be 105 post shock. EKG captured

## 2019-09-06 NOTE — Progress Notes (Signed)
Cardiology Office Note:    Date:  09/07/2019   ID:  Tanya [name], DOB Mar 27, 1945, MRN [id number]  PCP:  Tanya Chess, NP  Cardiologist:  Tanya Klein, MD   Referring MD: Tanya Chess, NP   Chief Complaint  Patient presents with  . Follow-up  hospital follow up s/p DCCV in ER and CHF exacerbation  History of Present Illness:    Tanya [name] is a 74 y.o. female with a hx of severe AS s/p TAVR 06/29/18, PAF on chronic anticoagulation, moderate PH, HTN, chronic diastolic heart failure, OSA on CPAP, chronic respiratory failure on home O2, DM with neuropathy, morbid obesity, and lymphedema following left mastectomy.  She is on chronic doxycycline for septic arthritis. Heart monitor 09/2018 with no arrhythmias and no correlation with symptoms, but did show PACs.   Hospital course following TAVR complicatd by Afib RVR treated with amiodarone. Follow up echo with improved EF to 65%. LHC with widely patent coronary arteries. She was discharged on ASA and eliquis. She was seen in clinic 07/06/19   She was recently admitted 09/03/19-09/04/19 for acute on chronic respiratory failure and acute on chronic diastolic heart failure. She was diuresed and weaned from BiPAP. She unfortunately was seen back in the ER 09/05/19 with Afib RVR. Per EMS report, mutliple rhythms prior to arrival in ER including SVT, Afib RVR, VT, and sinus tachcyardia. Pt received 10 mg cardizem IV, 150 mg IV amiodarone, 6, 12, and 12 mg adenosine and found to have persistent tachycardia.  She was diaphoretic with cold extremities. Decision was made to cardiovert her in the ER since she had been on uninterrupted anticoagulation. EDP suspected Afib with LBBB and less likely SVT and VT in the field. She was discharged without admission and with close cardiolgoy follow up. Also instructed to increase torpol to 50 mg daily - she has not done this yet.  She presents today with her daughter Tanya [name] who helps with history. She is still taking  the increased lasix of 40 mg daily, which is up from her baseline of 20 mg.  They do report having fat back cured in salt the day prior to her initial ER visit. She also asks about cured and salted pork. We discussed daily weights and sodium reduction to under 1500 mg daily. She was instructed to increase toprol to 50 mg daily, but has not done this yet. Overall, feeling much better.   I suspect CHF exacerbation was due to dietary indiscretion and RVR episode likely due to CHF exacerbation.   She is not interested in [place] vaccination.   Of note, she sees ortho in 1 hr for hip pain limiting mobility. At two week follow up, may also need preop clearance.  Past Medical History:  Diagnosis Date  . Asthma   . Breast cancer in female [facility])    1994 s/p chemo and radical L mastectomy  . Diabetes ([place])   . DVT (deep venous thrombosis) (HCC)    in shoulder after shoulder replacement  . Full dentures   . GERD (gastroesophageal reflux disease)   . Gout   . H/O total shoulder replacement   . History of knee replacement, total, bilateral   . 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 now on daily doxycycline.  Marland Kitchen HTN (hypertension)   . Lymphedema of left arm    s/p mastectomy with lymph node removal for breast cancer  . Morbid obesity ([place])   .  Neuropathy   . OSA on CPAP   . Ovarian cancer ([place])    2012 s/p chemo and radical hysterectomy  . PAF (paroxysmal atrial fibrillation) (HCC)    on Eliquis  . Pulmonary nodules    seen on pre TAVR CT scan. needs follow up CT in 06/2019  . S/P TAVR (transcatheter aortic valve replacement)    23 mm Edwards Sapien 3 transcatheter heart valve placed via percutaneous right transfemoral approach   . Severe aortic stenosis   . Urine incontinence     Past Surgical History:  Procedure Laterality Date  . MASTECTOMY Left 1993  . RIGHT/LEFT HEART CATH AND CORONARY ANGIOGRAPHY N/A 06/25/2018   Procedure:  RIGHT/LEFT HEART CATH AND CORONARY ANGIOGRAPHY;  Surgeon: Belva Crome, MD;  Location: [place] [facility];  Service: Cardiovascular;  Laterality: N/A;  . TEE WITHOUT CARDIOVERSION N/A 06/29/2018   Procedure: TRANSESOPHAGEAL ECHOCARDIOGRAM (TEE);  Surgeon: Sherren Mocha, MD;  Location: [place];  Service: Open Heart Surgery;  Laterality: N/A;  . TRANSCATHETER AORTIC VALVE REPLACEMENT, TRANSFEMORAL N/A 06/29/2018   Procedure: TRANSCATHETER AORTIC VALVE REPLACEMENT, TRANSFEMORAL;  Surgeon: Sherren Mocha, MD;  Location: [place];  Service: Open Heart Surgery;  Laterality: N/A;    Current Medications: Current Meds  Medication Sig  . acetaminophen (TYLENOL) 325 MG tablet Take 650 mg by mouth every 6 (six) hours as needed for mild pain or fever.  Marland Kitchen apixaban (ELIQUIS) 5 MG TABS tablet Take 5 mg by mouth 2 (two) times daily.  Marland Kitchen atorvastatin (LIPITOR) 40 MG tablet Take 40 mg by mouth daily.  Marland Kitchen azelastine (ASTELIN) 0.1 % nasal spray Place 1 spray into both nostrils 2 (two) times daily. Use in each nostril as directed  . calcium carbonate (OS-CAL - DOSED IN MG OF ELEMENTAL CALCIUM) 1250 (500 Ca) MG tablet Take 1 tablet by mouth 2 (two) times daily with a meal.  . doxycycline (VIBRA-TABS) 100 MG tablet Take 100 mg by mouth 2 (two) times daily.  . DULoxetine (CYMBALTA) 30 MG capsule Take 30 mg by mouth 2 (two) times daily.  Marland Kitchen esomeprazole (NEXIUM) 40 MG capsule Take 40 mg by mouth daily at 12 noon.  . fexofenadine (ALLEGRA) 180 MG tablet Take 180 mg by mouth daily.  . fluticasone furoate-vilanterol (BREO ELLIPTA) 200-25 MCG/INH AEPB Inhale 1 puff into the lungs daily.  . furosemide (LASIX) 40 MG tablet Take 1 tablet (40 mg) in the morning. May take additional 1 tablet in the afternoon as needed for leg swelling, SOB and/or > 2 lbs weight gain.  . metFORMIN (GLUCOPHAGE) 500 MG tablet Take 1,000 mg by mouth 2 (two) times daily with a meal.  . metoprolol succinate (TOPROL XL) 25 MG 24 hr tablet Take 1 tablet (25  mg total) by mouth in the morning and at bedtime. Take with or immediately following a meal.  . montelukast (SINGULAIR) 10 MG tablet Take 10 mg by mouth at bedtime.  . Multiple Vitamin (MULTIVITAMIN WITH MINERALS) TABS tablet Take 1 tablet by mouth daily.  . potassium chloride (KLOR-CON) 10 MEQ tablet Take 1 tablet (10 mEq total) by mouth daily.  . sitaGLIPtin (JANUVIA) 50 MG tablet Take 50 mg by mouth daily.   . vitamin E 100 UNIT capsule Take 100 Units by mouth daily.  . [DISCONTINUED] metoprolol succinate (TOPROL XL) 50 MG 24 hr tablet Take 1 tablet (50 mg total) by mouth daily. Take with or immediately following a meal.     Allergies:   Fentanyl, Pneumovax [pneumococcal polysaccharide vaccine],  Sulfa antibiotics, and Levaquin [levofloxacin]   Social History   Socioeconomic History  . Marital status: Single    Spouse name: Not on file  . Number of children: Not on file  . Years of education: Not on file  . Highest education level: Not on file  Occupational History  . Not on file  Tobacco Use  . Smoking status: Former Smoker    Types: Cigarettes  . Smokeless tobacco: Never Used  Vaping Use  . Vaping Use: Never used  Substance and Sexual Activity  . Alcohol use: Never  . Drug use: Never  . Sexual activity: Not on file  Other Topics Concern  . Not on file  Social History Narrative   Divorced many years ago, recently moved in with daughter   Social Determinants of Health   Financial Resource Strain:   . Difficulty of Paying Living Expenses:   Food Insecurity:   . Worried About Charity fundraiser in the Last Year:   . Arboriculturist in the Last Year:   Transportation Needs:   . Film/video editor (Medical):   Marland Kitchen Lack of Transportation (Non-Medical):   Physical Activity:   . Days of Exercise per Week:   . Minutes of Exercise per Session:   Stress:   . Feeling of Stress :   Social Connections:   . Frequency of Communication with Friends and Family:   . Frequency  of Social Gatherings with Friends and Family:   . Attends Religious Services:   . Active Member of Clubs or Organizations:   . Attends Archivist Meetings:   Marland Kitchen Marital Status:      Family History: The patient's family history includes Heart disease in her father; Hypertension in her mother; Renal cancer in her brother.  ROS:   Please see the history of present illness.     All other systems reviewed and are negative.  EKGs/Labs/Other Studies Reviewed:    The following studies were reviewed today:  Echo 09/03/19: 1. Left ventricular ejection fraction, by estimation, is 60 to 65%. The  left ventricle has normal function. The left ventricle has no regional  wall motion abnormalities. There is severe left ventricular hypertrophy.  Left ventricular diastolic parameters  are consistent with Grade I diastolic dysfunction (impaired relaxation).  2. Right ventricular systolic function is normal. The right ventricular  size is normal. Mildly increased right ventricular wall thickness. There  is mildly elevated pulmonary artery systolic pressure. The estimated right  ventricular systolic pressure is  62.8 mmHg.  3. Left atrial size was mildly dilated.  4. The mitral valve is abnormal, thickened and calcified with marked  annular calcification. Moderate mitral valve regurgitation. The mean  mitral valve gradient is 4.0 mmHg suggesting potential mild stenosis.  5. Tricuspid valve regurgitation is mild to moderate.  6. The aortic valve has been repaired/replaced. Aortic valve  regurgitation is not visualized. There is a 23 mm Edwards Sapien  prosthetic (TAVR) valve present in the aortic position. no paravalvular  leak. Aortic valve mean gradient measures 14.0 mmHg  (stable).  7. The inferior vena cava is normal in size with greater than 50%  respiratory variability, suggesting right atrial pressure of 3 mmHg.   EKG:  EKG is ordered today.  The ekg ordered today  demonstrates sinus rhythm with HR 69, LBBB (old)  Recent Labs: 09/03/2019: B Natriuretic Peptide 316.6 09/05/2019: ALT 39; BUN 21; Creatinine, Ser 1.20; Hemoglobin 13.3; Magnesium 1.6; Platelets 329; Potassium 3.9; Sodium  137  Recent Lipid Panel    Component Value Date/Time   CHOL 176 06/25/2018 1223   TRIG 187 (H) 06/25/2018 1223   HDL 55 06/25/2018 1223   CHOLHDL 3.2 06/25/2018 1223   VLDL 37 06/25/2018 1223   LDLCALC 84 06/25/2018 1223    Physical Exam:    VS:  BP 102/80 (BP Location: Right Arm, Patient Position: Sitting, Cuff Size: Large)   Pulse 69   Temp (!) 97.3 F (36.3 C)   Ht 4\' 8"  (1.422 m)   Wt 183 lb (83 kg)   SpO2 93%   BMI 41.03 kg/m     Wt Readings from Last 3 Encounters:  09/07/19 183 lb (83 kg)  09/05/19 182 lb (82.6 kg)  09/04/19 182 lb 5.1 oz (82.7 kg)     GEN: obese female in no acute distress HEENT: Normal NECK: No JVD; No carotid bruits LYMPHATICS: No lymphadenopathy CARDIAC: RRR, no murmurs, rubs, gallops RESPIRATORY:  Clear to auscultation without rales, wheezing or rhonchi  ABDOMEN: Soft, non-tender, non-distended MUSCULOSKELETAL:  No edema; No deformity  SKIN: Warm and dry NEUROLOGIC:  Alert and oriented x 3 PSYCHIATRIC:  Normal affect   ASSESSMENT:    1. Atrial fibrillation with RVR ([place])   2. Medication management   3. Chronic combined systolic and diastolic heart failure ([place])   4. S/P TAVR (transcatheter aortic valve replacement)   5. Chronic anticoagulation   6. Essential hypertension   7. Aortic atherosclerosis ([employer])   8. Pulmonary nodules   9. Morbid obesity ([place])    PLAN:    In order of problems listed above:  PAF RVR Chronic anticoagulation - continue eliquis - s/p cardioversion in ER for presumed Afib RVR - I agree with the increased dose of toprol and recommended splitting the dose to 25 mg BID   AS s/p TAVR 2020 - stable on last echo   Chronic systolic and diastolic heart failure - EF has been as low as  40-45% prior to TAVR in 2020 - EF on latest echo was 60-65% - continue increased lasix of 40 mg x 2 more days, then resume home dose of 20 mg daily - daily weights - follow up in 2 weeks - suspect recent exacerbation was related to dietary indiscretion with salt - BMP today   Hyperlipidemia - continue  40 mg lipitor   Hypertension - continue medications as above - BP at goal   Pulmonary nodules - given smoking history, repeat CT chest 07/2019 with stable pulmonary nodules, likely benign   Aortic atherosclerosis - as seen in CT chest - risk factor modification   Obesity - may be contributing to her dyspnea    Medication Adjustments/Labs and Tests Ordered: Current medicines are reviewed at length with the patient today.  Concerns regarding medicines are outlined above.  Orders Placed This Encounter  Procedures  . Basic metabolic panel  . EKG 12-Lead   Meds ordered this encounter  Medications  . metoprolol succinate (TOPROL XL) 25 MG 24 hr tablet    Sig: Take 1 tablet (25 mg total) by mouth in the morning and at bedtime. Take with or immediately following a meal.    Dispense:  180 tablet    Refill:  3    Signed, Ledora Bottcher, [place]  09/07/2019 1:08 PM    [facility]

## 2019-09-06 NOTE — ED Provider Notes (Signed)
[facility] EMERGENCY DEPARTMENT Provider Note   CSN: [id number] Arrival date & time:        History Chief Complaint  Patient presents with  . Atrial Fibrillation  . RVR    Tanya [name] is a 74 y.o. female.  Patient has history of atrial fibrillation and is already on anticoagulants.  She recently been admitted to the hospital for approximately 18 hours secondary to fluid overload.  This is a new diagnosis of diastolic heart failure.  She is in sinus rhythm at that time.  She is been compliant to medications since then.  She states she woke up tonight with significant palpitations, lightheadedness and diaphoresis.  EMS was called.  Per EMS report the patient was in multiple rhythms.  They reported seeing SVT, A. fib with RVR, ventricular tachycardia and sinus tachycardia.  Throughout the transportation here she had 10 mg diltiazem 150 mg of amiodarone 6 mg, 12 mg, 12 mg of adenosine and is still having a fast heart rate.  Blood pressure is normal.   Atrial Fibrillation This is a chronic problem. The current episode started less than 1 hour ago. The problem occurs constantly. The problem has not changed since onset.      Past Medical History:  Diagnosis Date  . Asthma   . Breast cancer in female [facility])    1994 s/p chemo and radical L mastectomy  . Diabetes ([place])   . DVT (deep venous thrombosis) (HCC)    in shoulder after shoulder replacement  . Full dentures   . GERD (gastroesophageal reflux disease)   . Gout   . H/O total shoulder replacement   . History of knee replacement, total, bilateral   . 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 now on daily doxycycline.  Marland Kitchen HTN (hypertension)   . Lymphedema of left arm    s/p mastectomy with lymph node removal for breast cancer  . Morbid obesity ([place])   . Neuropathy   . OSA on CPAP   . Ovarian cancer ([place])    2012 s/p chemo and radical hysterectomy  . PAF  (paroxysmal atrial fibrillation) (HCC)    on Eliquis  . Pulmonary nodules    seen on pre TAVR CT scan. needs follow up CT in 06/2019  . S/P TAVR (transcatheter aortic valve replacement)    23 mm Edwards Sapien 3 transcatheter heart valve placed via percutaneous right transfemoral approach   . Severe aortic stenosis   . Urine incontinence     Patient Active Problem List   Diagnosis Date Noted  . Need for vaccination 09/03/2019  . S/P TAVR (transcatheter aortic valve replacement)   . Pulmonary nodules   . 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 Acute on chronic respiratory failure with hypoxia ([place])   . Demand ischemia ([place])   . Moderate mitral stenosis   . Acute on chronic diastolic CHF (congestive heart failure) ([place])   . Urine incontinence   . Severe aortic stenosis   . History of septic arthritis   . PAF (paroxysmal atrial fibrillation) ([place])   . Ovarian cancer ([place])   . OSA on CPAP   . Neuropathy   . HTN (hypertension)   . History of knee replacement, total, bilateral   . H/O total shoulder replacement   . Gout   . GERD (gastroesophageal reflux disease)   . Full dentures   . Diabetes ([place])   . Breast  cancer in female [facility])   . Asthma   . Lymphedema of left arm   . Morbid obesity ([place])   . Acute hypercapnic respiratory failure ([place]) 06/22/2018  . DKA (diabetic ketoacidoses) ([place]) 06/22/2018  . Elevated brain natriuretic peptide (BNP) level 06/22/2018  . Neutrophilic leukocytosis 57/03/7791    Past Surgical History:  Procedure Laterality Date  . MASTECTOMY Left 1993  . RIGHT/LEFT HEART CATH AND CORONARY ANGIOGRAPHY N/A 06/25/2018   Procedure: RIGHT/LEFT HEART CATH AND CORONARY ANGIOGRAPHY;  Surgeon: Belva Crome, MD;  Location: [facility] CV LAB;  Service: Cardiovascular;  Laterality: N/A;  . TEE WITHOUT CARDIOVERSION N/A 06/29/2018   Procedure: TRANSESOPHAGEAL ECHOCARDIOGRAM (TEE);  Surgeon: Sherren Mocha, MD;  Location: [facility];   Service: Open Heart Surgery;  Laterality: N/A;  . TRANSCATHETER AORTIC VALVE REPLACEMENT, TRANSFEMORAL N/A 06/29/2018   Procedure: TRANSCATHETER AORTIC VALVE REPLACEMENT, TRANSFEMORAL;  Surgeon: Sherren Mocha, MD;  Location: [facility];  Service: Open Heart Surgery;  Laterality: N/A;     OB History   No obstetric history on file.     Family History  Problem Relation Age of Onset  . Hypertension Mother   . Heart disease Father   . Renal cancer Brother     Social History   Tobacco Use  . Smoking status: Former Smoker    Types: Cigarettes  . Smokeless tobacco: Never Used  Vaping Use  . Vaping Use: Never used  Substance Use Topics  . Alcohol use: Never  . Drug use: Never    Home Medications Prior to Admission medications   Medication Sig Start Date End Date Taking? Authorizing Provider  acetaminophen (TYLENOL) 325 MG tablet Take 650 mg by mouth every 6 (six) hours as needed for mild pain or fever.   Yes [provider]  apixaban (ELIQUIS) 5 MG TABS tablet Take 5 mg by mouth 2 (two) times daily.   Yes [provider]  atorvastatin (LIPITOR) 40 MG tablet Take 40 mg by mouth daily.   Yes [provider]  calcium carbonate (OS-CAL - DOSED IN MG OF ELEMENTAL CALCIUM) 1250 (500 Ca) MG tablet Take 1 tablet by mouth 2 (two) times daily with a meal.   Yes [provider]  doxycycline (VIBRA-TABS) 100 MG tablet Take 100 mg by mouth 2 (two) times daily.   Yes [provider]  DULoxetine (CYMBALTA) 30 MG capsule Take 30 mg by mouth 2 (two) times daily.   Yes [provider]  esomeprazole (NEXIUM) 40 MG capsule Take 40 mg by mouth daily at 12 noon.   Yes [provider]  fexofenadine (ALLEGRA) 180 MG tablet Take 180 mg by mouth daily. 08/22/19  Yes [provider]  fluticasone furoate-vilanterol (BREO ELLIPTA) 200-25 MCG/INH AEPB Inhale 1 puff into the lungs daily.   Yes [provider]  metFORMIN (GLUCOPHAGE) 500 MG  tablet Take 1,000 mg by mouth 2 (two) times daily with a meal.   Yes [provider]  montelukast (SINGULAIR) 10 MG tablet Take 10 mg by mouth at bedtime.   Yes [provider]  Multiple Vitamin (MULTIVITAMIN WITH MINERALS) TABS tablet Take 1 tablet by mouth daily.   Yes [provider]  potassium chloride (KLOR-CON) 10 MEQ tablet Take 1 tablet (10 mEq total) by mouth daily. 12/16/18  Yes Croitoru, Mihai, MD  sitaGLIPtin (JANUVIA) 50 MG tablet Take 50 mg by mouth daily.    Yes [provider]  vitamin E 100 UNIT capsule Take 100 Units by mouth daily.  Yes [provider]  azelastine (ASTELIN) 0.1 % nasal spray Place 1 spray into both nostrils 2 (two) times daily. Use in each nostril as directed    [provider]  furosemide (LASIX) 40 MG tablet Take 1 tablet (40 mg) in the morning. May take additional 1 tablet in the afternoon as needed for leg swelling, SOB and/or > 2 lbs weight gain. 09/04/19   Mercy Riding, MD  metoprolol succinate (TOPROL XL) 50 MG 24 hr tablet Take 1 tablet (50 mg total) by mouth daily. Take with or immediately following a meal. 09/05/19   Rashidah [place], Corene Cornea, MD    Allergies    Fentanyl, Pneumovax [pneumococcal polysaccharide vaccine], Sulfa antibiotics, and Levaquin [levofloxacin]  Review of Systems   Review of Systems  All other systems reviewed and are negative.   Physical Exam Updated Vital Signs BP (!) 111/49 (BP Location: Right Arm)   Pulse 72   Temp 98.5 F (36.9 C) (Oral)   Resp 17   Ht 4\' 8"  (1.422 m)   Wt 82.6 kg   SpO2 96%   BMI 40.80 kg/m   Physical Exam Vitals and nursing note reviewed.  Constitutional:      Appearance: She is well-developed.  HENT:     Head: Normocephalic and atraumatic.     Mouth/Throat:     Mouth: Mucous membranes are moist.     Pharynx: Oropharynx is clear.  Eyes:     Pupils: Pupils are equal, round, and reactive to light.  Cardiovascular:     Rate and Rhythm: Normal  rate and regular rhythm.  Pulmonary:     Effort: No respiratory distress.     Breath sounds: No stridor.  Abdominal:     General: There is no distension.  Musculoskeletal:        General: No swelling or tenderness. Normal range of motion.     Cervical back: Normal range of motion.  Skin:    Coloration: Skin is pale (and cool. diaphoretic on forehead).  Neurological:     General: No focal deficit present.     Mental Status: She is alert.    ED Results / Procedures / Treatments   Labs (all labs ordered are listed, but only abnormal results are displayed) Labs Reviewed  COMPREHENSIVE METABOLIC PANEL - Abnormal; Notable for the following components:      Result Value   Chloride 97 (*)    Glucose, Bld 265 (*)    Creatinine, Ser 1.19 (*)    Calcium 8.8 (*)    AST 61 (*)    GFR calc non Af Amer 44 (*)    GFR calc Af Amer 51 (*)    Anion gap 16 (*)    All other components within normal limits  MAGNESIUM - Abnormal; Notable for the following components:   Magnesium 1.6 (*)    All other components within normal limits  I-STAT CHEM 8, ED - Abnormal; Notable for the following components:   Chloride 96 (*)    Creatinine, Ser 1.20 (*)    Glucose, Bld 262 (*)    Calcium, Ion 1.01 (*)    All other components within normal limits  CBC    EKG EKG Interpretation  Date/Time:  Monday September 05 2019 03:37:45 EDT Ventricular Rate:  196 PR Interval:    QRS Duration: 165 QT Interval:  276 QTC Calculation: 499 R Axis:   72 Text Interpretation: Extreme tachycardia with wide complex, no further rhythm analysis attempted Baseline wander in lead(s)  II III aVR aVF V1 V4 V6 similar morphology to previous but faster rate Confirmed by Merrily Pew [phone]) on 09/05/2019 3:39:47 AM   Radiology DG Chest Portable 1 View  Result Date: 09/05/2019 CLINICAL DATA:  Atrial fibrillation workup EXAM: PORTABLE CHEST 1 VIEW COMPARISON:  Two days ago FINDINGS: Improved aeration from prior. Stable  cardiomegaly. Transcatheter aortic valve replacement. There is artifact from EKG leads and a defibrillator pad. No visible effusion or pneumothorax. Reverse glenohumeral arthroplasty on the left with unchanged appearance IMPRESSION: Improved edema compared to 2 days ago.  No new abnormality. Electronically Signed   By: Monte Fantasia M.D.   On: 09/05/2019 05:06    Procedures .Critical Care Performed by: Merrily Pew, MD Authorized by: Merrily Pew, MD   Critical care provider statement:    Critical care time (minutes):  45   Critical care was necessary to treat or prevent imminent or life-threatening deterioration of the following conditions:  Cardiac failure, circulatory failure and shock   Critical care was time spent personally by me on the following activities:  Discussions with consultants, evaluation of patient's response to treatment, examination of patient, ordering and performing treatments and interventions, ordering and review of laboratory studies, ordering and review of radiographic studies, pulse oximetry, re-evaluation of patient's condition, obtaining history from patient or surrogate and review of old charts .1-3 Lead EKG Interpretation Performed by: Merrily Pew, MD Authorized by: Merrily Pew, MD     Interpretation: abnormal     ECG rate:  78   ECG rate assessment: normal     Rhythm: sinus rhythm     Ectopy: none     Conduction: abnormal     Abnormal conduction comment:  IVCD .Cardioversion  Date/Time: 09/06/2019 6:56 AM Performed by: Merrily Pew, MD Authorized by: Merrily Pew, MD   Consent:    Consent obtained:  Verbal   Consent given by:  Patient   Risks discussed:  Cutaneous burn, death, induced arrhythmia and pain   Alternatives discussed:  No treatment and rate-control medication Pre-procedure details:    Cardioversion basis:  Emergent   Rhythm:  Atrial fibrillation   Electrode placement:  Anterior-posterior Patient sedated: Yes. Refer to sedation  procedure documentation for details of sedation.  Attempt one:    Cardioversion mode:  Synchronous   Waveform:  Monophasic   Shock (Joules):  120   Shock outcome:  Conversion to normal sinus rhythm Post-procedure details:    Patient status:  Awake   Patient tolerance of procedure:  Tolerated well, no immediate complications .Sedation  Date/Time: 09/06/2019 6:57 AM Performed by: Merrily Pew, MD Authorized by: Merrily Pew, MD   Consent:    Consent obtained:  Verbal   Consent given by:  Patient   Risks discussed:  Allergic reaction, dysrhythmia, inadequate sedation, nausea, prolonged hypoxia resulting in organ damage, prolonged sedation necessitating reversal, respiratory compromise necessitating ventilatory assistance and intubation and vomiting   Alternatives discussed:  Analgesia without sedation, anxiolysis and regional anesthesia Universal protocol:    Procedure explained and questions answered to patient or proxy's satisfaction: yes     Relevant documents present and verified: yes     Test results available and properly labeled: yes     Imaging studies available: yes     Required blood products, implants, devices, and special equipment available: yes     Site/side marked: yes     Immediately prior to procedure a time out was called: yes     Patient identity confirmation method:  Verbally with  patient Indications:    Procedure performed:  Cardioversion   Procedure necessitating sedation performed by:  Physician performing sedation Pre-sedation assessment:    Time since last food or drink:  3 hours   ASA classification: class 2 - patient with mild systemic disease     Neck mobility: normal     Mouth opening:  3 or more finger widths   Thyromental distance:  4 finger widths   Mallampati score:  II - soft palate, uvula, fauces visible   Pre-sedation assessments completed and reviewed: airway patency, cardiovascular function, hydration status, mental status, nausea/vomiting,  pain level, respiratory function and temperature     Pre-sedation assessment completed:  09/05/2019 3:30 AM Immediate pre-procedure details:    Reassessment: Patient reassessed immediately prior to procedure     Reviewed: vital signs, relevant labs/tests and NPO status     Verified: bag valve mask available, emergency equipment available, intubation equipment available, IV patency confirmed, oxygen available and suction available   Procedure details (see MAR for exact dosages):    Preoxygenation:  Nasal cannula   Sedation:  Etomidate   Intended level of sedation: deep   Intra-procedure monitoring:  Blood pressure monitoring, cardiac monitor, continuous pulse oximetry, frequent LOC assessments, frequent vital sign checks and continuous capnometry   Intra-procedure events: none     Total Provider sedation time (minutes):  30 Post-procedure details:    Attendance: Constant attendance by certified staff until patient recovered     Recovery: Patient returned to pre-procedure baseline     Post-sedation assessments completed and reviewed: airway patency, cardiovascular function, hydration status, mental status, nausea/vomiting, pain level, respiratory function and temperature     Patient is stable for discharge or admission: yes     Patient tolerance:  Tolerated well, no immediate complications   (including critical care time)  Medications Ordered in ED Medications  etomidate (AMIDATE) injection ( Intravenous Canceled Entry 09/05/19 0400)    ED Course  I have reviewed the triage vital signs and the nursing notes.  Pertinent labs & imaging results that were available during my care of the patient were reviewed by me and considered in my medical decision making (see chart for details).    MDM Rules/Calculators/A&P                          History of atrial fibrillation but was just in sinus rhythm yesterday at discharge from the hospital.  Acute onset of symptoms last night.  Already  anticoagulated as well.  Discussion with the patient and we like for him emergent cardioversion secondary to impending shock with her diaphoresis and cold extremities.  1 cardioversion attempt and patient converted to a sinus rhythm.  I do not think that she actually had ventricular tachycardia or SVT in the field I think she has abnormal rhythm appearance secondary to her intraventricular conduction delay likely left bundle branch block.  Patient was observed for multiple hours after the cardioversion.  Labs were checked.  Patient ambulated.  Patient stayed in normal sinus rhythm with a left bundle branch block.  After discussion with cardiology felt is appropriate for discharge and increase her metoprolol.  She follow-up in A. fib clinic or with Dr. Sallyanne Kuster.  Final Clinical Impression(s) / ED Diagnoses Final diagnoses:  Atrial fibrillation with RVR ([place])    Rx / DC Orders ED Discharge Orders         Ordered    metoprolol succinate (TOPROL XL) 50 MG 24  hr tablet  Daily     Discontinue  Reprint     09/05/19 0634           Kermitt Harjo, Corene Cornea, MD 09/06/19 0700

## 2019-09-07 ENCOUNTER — Other Ambulatory Visit: Payer: Self-pay

## 2019-09-07 ENCOUNTER — Encounter: Payer: Self-pay | Admitting: Physician Assistant

## 2019-09-07 ENCOUNTER — Ambulatory Visit (INDEPENDENT_AMBULATORY_CARE_PROVIDER_SITE_OTHER): Payer: Medicare Other | Admitting: Physician Assistant

## 2019-09-07 VITALS — BP 102/80 | HR 69 | Temp 97.3°F | Ht <= 58 in | Wt 183.0 lb

## 2019-09-07 DIAGNOSIS — I10 Essential (primary) hypertension: Secondary | ICD-10-CM

## 2019-09-07 DIAGNOSIS — Z79899 Other long term (current) drug therapy: Secondary | ICD-10-CM

## 2019-09-07 DIAGNOSIS — I700 Atherosclerosis of aorta: Secondary | ICD-10-CM

## 2019-09-07 DIAGNOSIS — I4891 Unspecified atrial fibrillation: Secondary | ICD-10-CM

## 2019-09-07 DIAGNOSIS — I5042 Chronic combined systolic (congestive) and diastolic (congestive) heart failure: Secondary | ICD-10-CM | POA: Diagnosis not present

## 2019-09-07 DIAGNOSIS — Z952 Presence of prosthetic heart valve: Secondary | ICD-10-CM

## 2019-09-07 DIAGNOSIS — R918 Other nonspecific abnormal finding of lung field: Secondary | ICD-10-CM

## 2019-09-07 DIAGNOSIS — Z7901 Long term (current) use of anticoagulants: Secondary | ICD-10-CM

## 2019-09-07 MED ORDER — METOPROLOL SUCCINATE ER 25 MG PO TB24
25.0000 mg | ORAL_TABLET | Freq: Two times a day (BID) | ORAL | 3 refills | Status: DC
Start: 2019-09-07 — End: 2019-10-20

## 2019-09-07 NOTE — Patient Instructions (Addendum)
Medication Instructions:   CONTINUE increased Lasix 40 mg daily until Friday, then on Saturday resume 20 mg daily  INCREASE Toprol -XL to 25 mg 2 times a day   *If you need a refill on your cardiac medications before your next appointment, please call your pharmacy*   Lab Work: Your physician recommends that you return for lab work TODAY:   BMET   If you have labs (blood work) drawn today and your tests are completely normal, you will receive your results only by: Marland Kitchen MyChart Message (if you have MyChart) OR . A paper copy in the mail If you have any lab test that is abnormal or we need to change your treatment, we will call you to review the results.  Testing/Procedures: NONE ordered at this time of appointment   Follow-Up: At [facility], you and your health needs are our priority.  As part of our continuing mission to provide you with exceptional heart care, we have created designated Provider Care Teams.  These Care Teams include your primary Cardiologist (physician) and Advanced Practice Providers (APPs -  Physician Assistants and Nurse Practitioners) who all work together to provide you with the care you need, when you need it.  Your next appointment:   2 week(s)  The format for your next appointment:   In Person  Provider:   You may see Sanda Klein, MD or one of the following Advanced Practice Providers on your designated Care Team:    Almyra Deforest, PA-C  Fabian Sharp, [place] or   Roby Lofts, [place]  Other Instructions  Weigh yourself daily

## 2019-09-08 LAB — BASIC METABOLIC PANEL
BUN/Creatinine Ratio: 26 (ref 12–28)
BUN: 24 mg/dL (ref 8–27)
CO2: 25 mmol/L (ref 20–29)
Calcium: 10.2 mg/dL (ref 8.7–10.3)
Chloride: 97 mmol/L (ref 96–106)
Creatinine, Ser: 0.93 mg/dL (ref 0.57–1.00)
GFR calc Af Amer: 69 mL/min/{1.73_m2} (ref >59)
GFR calc non Af Amer: 60 mL/min/{1.73_m2} (ref >59)
Glucose: 144 mg/dL — ABNORMAL HIGH (ref 65–99)
Potassium: 4.8 mmol/L (ref 3.5–5.2)
Sodium: 138 mmol/L (ref 134–144)

## 2019-09-16 NOTE — Progress Notes (Signed)
TY

## 2019-09-20 ENCOUNTER — Emergency Department (HOSPITAL_COMMUNITY)
Admission: EM | Admit: 2019-09-20 | Discharge: 2019-09-20 | Disposition: A | Discharge status: Home / Self Care | Payer: Medicare Other | Attending: Emergency Medicine | Admitting: Emergency Medicine

## 2019-09-20 ENCOUNTER — Emergency Department (HOSPITAL_COMMUNITY): Payer: Medicare Other

## 2019-09-20 DIAGNOSIS — Z79899 Other long term (current) drug therapy: Secondary | ICD-10-CM | POA: Insufficient documentation

## 2019-09-20 DIAGNOSIS — E119 Type 2 diabetes mellitus without complications: Secondary | ICD-10-CM | POA: Insufficient documentation

## 2019-09-20 DIAGNOSIS — I10 Essential (primary) hypertension: Secondary | ICD-10-CM | POA: Insufficient documentation

## 2019-09-20 DIAGNOSIS — I480 Paroxysmal atrial fibrillation: Secondary | ICD-10-CM | POA: Insufficient documentation

## 2019-09-20 DIAGNOSIS — J45909 Unspecified asthma, uncomplicated: Secondary | ICD-10-CM | POA: Insufficient documentation

## 2019-09-20 DIAGNOSIS — R0602 Shortness of breath: Secondary | ICD-10-CM | POA: Insufficient documentation

## 2019-09-20 DIAGNOSIS — Z7984 Long term (current) use of oral hypoglycemic drugs: Secondary | ICD-10-CM | POA: Insufficient documentation

## 2019-09-20 DIAGNOSIS — R0789 Other chest pain: Secondary | ICD-10-CM | POA: Diagnosis present

## 2019-09-20 DIAGNOSIS — Z7901 Long term (current) use of anticoagulants: Secondary | ICD-10-CM | POA: Diagnosis not present

## 2019-09-20 LAB — BASIC METABOLIC PANEL
Anion gap: 14 (ref 5–15)
BUN: 29 mg/dL — ABNORMAL HIGH (ref 8–23)
CO2: 23 mmol/L (ref 22–32)
Calcium: 9 mg/dL (ref 8.9–10.3)
Chloride: 100 mmol/L (ref 98–111)
Creatinine, Ser: 1.04 mg/dL — ABNORMAL HIGH (ref 0.44–1.00)
GFR calc Af Amer: >60 mL/min (ref >60)
GFR calc non Af Amer: 52 mL/min — ABNORMAL LOW (ref >60)
Glucose, Bld: 276 mg/dL — ABNORMAL HIGH (ref 70–99)
Potassium: 5.2 mmol/L — ABNORMAL HIGH (ref 3.5–5.1)
Sodium: 137 mmol/L (ref 135–145)

## 2019-09-20 LAB — CBC WITH DIFFERENTIAL/PLATELET
Abs Immature Granulocytes: 0.08 10*3/uL — ABNORMAL HIGH (ref 0.00–0.07)
Basophils Absolute: 0 10*3/uL (ref 0.0–0.1)
Basophils Relative: 0 %
Eosinophils Absolute: 0 10*3/uL (ref 0.0–0.5)
Eosinophils Relative: 0 %
HCT: 39.7 % (ref 36.0–46.0)
Hemoglobin: 12.7 g/dL (ref 12.0–15.0)
Immature Granulocytes: 1 %
Lymphocytes Relative: 14 %
Lymphs Abs: 1.4 10*3/uL (ref 0.7–4.0)
MCH: 30.3 pg (ref 26.0–34.0)
MCHC: 32 g/dL (ref 30.0–36.0)
MCV: 94.7 fL (ref 80.0–100.0)
Monocytes Absolute: 1 10*3/uL (ref 0.1–1.0)
Monocytes Relative: 10 %
Neutro Abs: 7.7 10*3/uL (ref 1.7–7.7)
Neutrophils Relative %: 75 %
Platelets: 335 10*3/uL (ref 150–400)
RBC: 4.19 MIL/uL (ref 3.87–5.11)
RDW: 14.6 % (ref 11.5–15.5)
WBC: 10.3 10*3/uL (ref 4.0–10.5)
nRBC: 0 % (ref 0.0–0.2)

## 2019-09-20 LAB — BRAIN NATRIURETIC PEPTIDE: B Natriuretic Peptide: 575.7 pg/mL — ABNORMAL HIGH (ref 0.0–100.0)

## 2019-09-20 LAB — TROPONIN I (HIGH SENSITIVITY)
Troponin I (High Sensitivity): 25 ng/L — ABNORMAL HIGH (ref <18)
Troponin I (High Sensitivity): 68 ng/L — ABNORMAL HIGH (ref <18)

## 2019-09-20 LAB — MAGNESIUM: Magnesium: 1.2 mg/dL — ABNORMAL LOW (ref 1.7–2.4)

## 2019-09-20 MED ORDER — ASPIRIN 81 MG PO CHEW
324.0000 mg | CHEWABLE_TABLET | Freq: Once | ORAL | Status: AC
  Administered 2019-09-20: 324 mg via ORAL
  Filled 2019-09-20: qty 4

## 2019-09-20 MED ORDER — ONDANSETRON HCL 4 MG/2ML IJ SOLN
4.0000 mg | Freq: Once | INTRAMUSCULAR | Status: DC
  Filled 2019-09-20: qty 2

## 2019-09-20 MED ORDER — ETOMIDATE 2 MG/ML IV SOLN
0.1500 mg/kg | Freq: Once | INTRAVENOUS | Status: DC
  Filled 2019-09-20: qty 10

## 2019-09-20 MED ORDER — MAGNESIUM SULFATE 2 GM/50ML IV SOLN
2.0000 g | Freq: Once | INTRAVENOUS | Status: AC
  Administered 2019-09-20: 2 g via INTRAVENOUS
  Filled 2019-09-20: qty 50

## 2019-09-20 NOTE — Discharge Instructions (Addendum)
Keep your cardiology appointment as scheduled this Friday to discuss medications and possible changes that might need to be made.   Continue your home medications as prescribed.  Return to the emergency department for any new or worsening symptoms.  We hope you feel better. Thank you for allowing [place] to care for you

## 2019-09-20 NOTE — ED Triage Notes (Signed)
PT BIB [facility] w/ c/o afib RVR. PT awoke around 2300 with severe c/p and called EMS. Per EMS pt HR was > 200, and was given 6 mg of adenosine. PT HR decrease to approx. 170 and 10 mg of Cardizem was given. EMS reports vtach enroute and pt was given 150 mg of amnioderone drip. PT has hx of afib RVR, and has been taking prednisone and hydrocodone for cough/bronchitis. PT reported to EMS that they were on cardizem at home but recently had meds changed to extra dose of beta blockers. Upon arrival, pale and diaphoretic. PT HR 200 (RVR), RR 26,98 % on 2L.

## 2019-09-20 NOTE — ED Provider Notes (Signed)
Medical screening examination/treatment/procedure(s) were conducted as a shared visit with non-physician practitioner(s) and myself.  I personally evaluated the patient during the encounter.  EKG Interpretation  Date/Time:  Tuesday September 20 2019 01:41:10 EDT Ventricular Rate:  174 PR Interval:    QRS Duration: 144 QT Interval:  293 QTC Calculation: 499 R Axis:   33 Text Interpretation: Extreme tachycardia with wide complex, no further rhythm analysis attempted Confirmed by Pryor Curia [phone]) on 09/20/2019 1:46:59 AM     EKG Interpretation  Date/Time:  Tuesday September 20 2019 02:11:25 EDT Ventricular Rate:  78 PR Interval:    QRS Duration: 163 QT Interval:  439 QTC Calculation: 501 R Axis:   54 Text Interpretation: Sinus rhythm Left bundle branch block A fib has resolved No change since September 05 2019 Confirmed by Pryor Curia [phone]) on 09/20/2019 2:14:37 AM      Patient here in A. fib with RVR.  Having palpitations, diaphoresis, clamminess.  No chest pain or shortness of breath.  Recently had episode of the same that was unresponsive to medication and required cardioversion.  Patient had been started on metoprolol 25 mg which she has been compliant with as well as Eliquis.  Patient given diltiazem, adenosine and amiodarone with EMS and continued to be in A. fib with RVR with rate up into the 220s.  Prior to being cardioverted here, patient spontaneously converted back into a sinus rhythm.  Troponins have been mildly elevated and trending upward but likely secondary to demand ischemia due to elevated rate up into the 200s.  We have discussed with cardiology for medication recommendations.  Cardiology fellow states day team will see patient in the ED.   Atwood Adcock, Delice Bison, DO 09/20/19 [phone]

## 2019-09-20 NOTE — ED Notes (Signed)
Patient verbalizes understanding of discharge instructions. Opportunity for questioning and answers were provided. Armband removed by staff, pt discharged from ED. Wheeled out to lobby  

## 2019-09-20 NOTE — ED Provider Notes (Signed)
[facility] EMERGENCY DEPARTMENT Provider Note   CSN: [id number] Arrival date & time: 09/20/19  0129     History Chief Complaint  Patient presents with  . Chest Pain  . Tachycardia    Tanya [name] is a 74 y.o. female.  The history is provided by the patient and medical records.  Chest Pain   74 year old female with history of asthma, GERD, hypertension, neuropathy, sleep apnea, paroxysmal A. fib on Eliquis, prior TAVR, presenting to the ED for severe palpitations.  Patient states she went to bed last night and felt normal, woke up around 2300 with palpitations.  Patient states it feels like her prior episodes of AFIB.  She was cardioverted in the ED about 2 weeks ago for same.  States in the time since then she has been doing well.  She was recently diagnosed with bronchitis and is currently on prednisone and z-pack.  She denies fevers.  No chest pain, just palpitations and feeling lightheaded.  She has been complaint with her eliquis, no missed doses.    Past Medical History:  Diagnosis Date  . Asthma   . Breast cancer in female [facility])    1994 s/p chemo and radical L mastectomy  . Diabetes ([place])   . DVT (deep venous thrombosis) (HCC)    in shoulder after shoulder replacement  . Full dentures   . GERD (gastroesophageal reflux disease)   . Gout   . H/O total shoulder replacement   . History of knee replacement, total, bilateral   . 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 now on daily doxycycline.  Marland Kitchen HTN (hypertension)   . Lymphedema of left arm    s/p mastectomy with lymph node removal for breast cancer  . Morbid obesity ([place])   . Neuropathy   . OSA on CPAP   . Ovarian cancer ([place])    2012 s/p chemo and radical hysterectomy  . PAF (paroxysmal atrial fibrillation) (HCC)    on Eliquis  . Pulmonary nodules    seen on pre TAVR CT scan. needs follow up CT in 06/2019  . S/P TAVR (transcatheter aortic  valve replacement)    23 mm Edwards Sapien 3 transcatheter heart valve placed via percutaneous right transfemoral approach   . Severe aortic stenosis   . Urine incontinence     Patient Active Problem List   Diagnosis Date Noted  . Need for vaccination 09/03/2019  . S/P TAVR (transcatheter aortic valve replacement)   . Pulmonary nodules   . Moderate pulmonary arterial systolic hypertension ([place]) 06/26/2018  . Acute on chronic respiratory failure with hypoxia ([place])   . Demand ischemia ([place])   . Moderate mitral stenosis   . Acute on chronic diastolic CHF (congestive heart failure) ([place])   . Urine incontinence   . Severe aortic stenosis   . History of septic arthritis   . PAF (paroxysmal atrial fibrillation) ([place])   . Ovarian cancer ([place])   . OSA on CPAP   . Neuropathy   . HTN (hypertension)   . History of knee replacement, total, bilateral   . H/O total shoulder replacement   . Gout   . GERD (gastroesophageal reflux disease)   . Full dentures   . Diabetes ([place])   . Breast cancer in female [facility])   . Asthma   . Lymphedema of left arm   . Morbid obesity ([place])   . Acute hypercapnic respiratory failure ([place]) 06/22/2018  .  DKA (diabetic ketoacidoses) ([place]) 06/22/2018  . Elevated brain natriuretic peptide (BNP) level 06/22/2018  . Neutrophilic leukocytosis 41/66/0630    Past Surgical History:  Procedure Laterality Date  . MASTECTOMY Left 1993  . RIGHT/LEFT HEART CATH AND CORONARY ANGIOGRAPHY N/A 06/25/2018   Procedure: RIGHT/LEFT HEART CATH AND CORONARY ANGIOGRAPHY;  Surgeon: Belva Crome, MD;  Location: [facility] CV [facility];  Service: Cardiovascular;  Laterality: N/A;  . TEE WITHOUT CARDIOVERSION N/A 06/29/2018   Procedure: TRANSESOPHAGEAL ECHOCARDIOGRAM (TEE);  Surgeon: Sherren Mocha, MD;  Location: [facility];  Service: Open Heart Surgery;  Laterality: N/A;  . TRANSCATHETER AORTIC VALVE REPLACEMENT, TRANSFEMORAL N/A 06/29/2018   Procedure: TRANSCATHETER AORTIC VALVE REPLACEMENT,  TRANSFEMORAL;  Surgeon: Sherren Mocha, MD;  Location: [facility];  Service: Open Heart Surgery;  Laterality: N/A;     OB History   No obstetric history on file.     Family History  Problem Relation Age of Onset  . Hypertension Mother   . Heart disease Father   . Renal cancer Brother     Social History   Tobacco Use  . Smoking status: Former Smoker    Types: Cigarettes  . Smokeless tobacco: Never Used  Vaping Use  . Vaping Use: Never used  Substance Use Topics  . Alcohol use: Never  . Drug use: Never    Home Medications Prior to Admission medications   Medication Sig Start Date End Date Taking? Authorizing Provider  acetaminophen (TYLENOL) 325 MG tablet Take 650 mg by mouth every 6 (six) hours as needed for mild pain or fever.    [provider]  apixaban (ELIQUIS) 5 MG TABS tablet Take 5 mg by mouth 2 (two) times daily.    [provider]  atorvastatin (LIPITOR) 40 MG tablet Take 40 mg by mouth daily.    [provider]  azelastine (ASTELIN) 0.1 % nasal spray Place 1 spray into both nostrils 2 (two) times daily. Use in each nostril as directed    [provider]  calcium carbonate (OS-CAL - DOSED IN MG OF ELEMENTAL CALCIUM) 1250 (500 Ca) MG tablet Take 1 tablet by mouth 2 (two) times daily with a meal.    [provider]  doxycycline (VIBRA-TABS) 100 MG tablet Take 100 mg by mouth 2 (two) times daily.    [provider]  DULoxetine (CYMBALTA) 30 MG capsule Take 30 mg by mouth 2 (two) times daily.    [provider]  esomeprazole (NEXIUM) 40 MG capsule Take 40 mg by mouth daily at 12 noon.    [provider]  fexofenadine (ALLEGRA) 180 MG tablet Take 180 mg by mouth daily. 08/22/19   [provider]  fluticasone furoate-vilanterol (BREO ELLIPTA) 200-25 MCG/INH AEPB Inhale 1 puff into the lungs daily.    [provider]  furosemide (LASIX) 40 MG tablet Take 1 tablet (40 mg) in the morning. May  take additional 1 tablet in the afternoon as needed for leg swelling, SOB and/or > 2 lbs weight gain. 09/04/19   Mercy Riding, MD  metFORMIN (GLUCOPHAGE) 500 MG tablet Take 1,000 mg by mouth 2 (two) times daily with a meal.    [provider]  metoprolol succinate (TOPROL XL) 25 MG 24 hr tablet Take 1 tablet (25 mg total) by mouth in the morning and at bedtime. Take with or immediately following a meal. 09/07/19   Duke, Tami Lin, PA  montelukast (SINGULAIR) 10 MG tablet Take 10 mg by mouth at bedtime.  [provider]  Multiple Vitamin (MULTIVITAMIN WITH MINERALS) TABS tablet Take 1 tablet by mouth daily.    [provider]  potassium chloride (KLOR-CON) 10 MEQ tablet Take 1 tablet (10 mEq total) by mouth daily. 12/16/18   Croitoru, Mihai, MD  sitaGLIPtin (JANUVIA) 50 MG tablet Take 50 mg by mouth daily.     [provider]  vitamin E 100 UNIT capsule Take 100 Units by mouth daily.    [provider]    Allergies    Fentanyl, Pneumovax [pneumococcal polysaccharide vaccine], Sulfa antibiotics, and Levaquin [levofloxacin]  Review of Systems   Review of Systems  Cardiovascular: Positive for chest pain.  All other systems reviewed and are negative.   Physical Exam Updated Vital Signs BP 126/74 (BP Location: Right Arm)   Pulse (!) 165   Resp (!) 26   SpO2 94%   Physical Exam Vitals and nursing note reviewed.  Constitutional:      Appearance: She is well-developed.     Comments: Pale, diaphoretic  HENT:     Head: Normocephalic and atraumatic.  Eyes:     Conjunctiva/sclera: Conjunctivae normal.     Pupils: Pupils are equal, round, and reactive to light.  Cardiovascular:     Rate and Rhythm: Tachycardia present. Rhythm irregularly irregular.     Heart sounds: Normal heart sounds.     Comments: AFIB w/RVR, rate 170's-200's Pulmonary:     Effort: Pulmonary effort is normal.     Breath sounds: Wheezing present.     Comments: Some  expiratory wheezes noted, more pronounced in RUL Abdominal:     General: Bowel sounds are normal.     Palpations: Abdomen is soft.  Musculoskeletal:        General: Normal range of motion.     Cervical back: Normal range of motion.  Skin:    General: Skin is warm.  Neurological:     Mental Status: She is alert and oriented to person, place, and time.     ED Results / Procedures / Treatments   Labs (all labs ordered are listed, but only abnormal results are displayed) Labs Reviewed  CBC WITH DIFFERENTIAL/PLATELET - Abnormal; Notable for the following components:      Result Value   Abs Immature Granulocytes 0.08 (*)    All other components within normal limits  BASIC METABOLIC PANEL - Abnormal; Notable for the following components:   Potassium 5.2 (*)    Glucose, Bld 276 (*)    BUN 29 (*)    Creatinine, Ser 1.04 (*)    GFR calc non Af Amer 52 (*)    All other components within normal limits  BRAIN NATRIURETIC PEPTIDE - Abnormal; Notable for the following components:   B Natriuretic Peptide 575.7 (*)    All other components within normal limits  MAGNESIUM - Abnormal; Notable for the following components:   Magnesium 1.2 (*)    All other components within normal limits  TROPONIN I (HIGH SENSITIVITY) - Abnormal; Notable for the following components:   Troponin I (High Sensitivity) 25 (*)    All other components within normal limits  TROPONIN I (HIGH SENSITIVITY)    EKG EKG Interpretation  Date/Time:  Tuesday September 20 2019 01:41:10 EDT Ventricular Rate:  174 PR Interval:    QRS Duration: 144 QT Interval:  293 QTC Calculation: 499 R Axis:   33 Text Interpretation: Extreme tachycardia with wide complex, no further rhythm analysis attempted Confirmed by Ward, Cyril Mourning [phone]) on 09/20/2019 1:46:59 AM  Radiology DG Chest Port 1 View  Result Date: 09/20/2019 CLINICAL DATA:  74 year old female with AFib. EXAM: PORTABLE CHEST 1 VIEW COMPARISON:  Chest radiograph dated  09/05/2019. FINDINGS: There is background of emphysema and chronic interstitial coarsening. No focal consolidation, pleural effusion, or pneumothorax. Mild cardiomegaly. Aortic valve repair. Atherosclerotic calcification of the aorta. No acute osseous pathology. Left shoulder arthroplasty. IMPRESSION: 1. No acute cardiopulmonary process. 2. Mild cardiomegaly. Electronically Signed   By: Anner Crete M.D.   On: 09/20/2019 02:05    Procedures Procedures (including critical care time)  CRITICAL CARE Performed by: Larene Pickett   Total critical care time: 45 minutes  Critical care time was exclusive of separately billable procedures and treating other patients.  Critical care was necessary to treat or prevent imminent or life-threatening deterioration.  Critical care was time spent personally by me on the following activities: development of treatment plan with patient and/or surrogate as well as nursing, discussions with consultants, evaluation of patient's response to treatment, examination of patient, obtaining history from patient or surrogate, ordering and performing treatments and interventions, ordering and review of laboratory studies, ordering and review of radiographic studies, pulse oximetry and re-evaluation of patient's condition.  CHA2DS2-VASc Score = 7  The patient's score is based upon: CHF History: 1 HTN History: 1 Age : 2 Diabetes History: 1 Stroke History: 0 Vascular Disease History: 1 Gender: 1      ASSESSMENT AND PLAN: Paroxysmal Atrial Fibrillation (ICD10:  I48.0) The patient's CHA2DS2-VASc score is 7, indicating a 11.2% annual risk of stroke.  Secondary Hypercoagulable State (ICD10:  D68.69) The patient is at significant risk for stroke/thromboembolism based upon her CHA2DS2-VASc Score of 7.  Continue Apixaban (Eliquis).    Signed,  Larene Pickett, PA-C    09/20/2019 2:12 AM     Medications Ordered in ED Medications  magnesium sulfate IVPB 2 g 50 mL  (2 g Intravenous New Bag/Given 09/20/19 0160)    ED Course  I have reviewed the triage vital signs and the nursing notes.  Pertinent labs & imaging results that were available during my care of the patient were reviewed by me and considered in my medical decision making (see chart for details).    MDM Rules/Calculators/A&P    74 year old female presenting to the ED in A. fib with RVR.  Similar presentation 2 weeks ago. Rate 160's-200's today, patient pale and diaphoretic.  Has already been given diltiazem bolus, adenosine, and amiodarone.  Patient's BP is stable currently.  Given her high rate despite multiple interventions, she likely will require electrocardioversion.  Had same 2 weeks ago.  She has been compliant with her Eliquis.  She is aware risks/benefits of cardioversion and is willing to proceed again today.    2:14 AM Patient had self converted, now NSR with rate 78.  LBBB present which is old.  Labs pending.  Will observe.  Remains in NSR.  Labs as above-- mildly elevated BNP without pulmonary edema on x-ray.  Trop 25.  Mag is low at 1.2, given IV repletion.  Delta trop pending.  5:54 AM Patient remains in NSR here.  She continues to deny chest pain.  Delta trop still pending.  Discussed with cardiology fellow, Dr. Ailene Ravel, regarding possible medication adjustments to help keep her in sinus rhythm-- morning team to evaluate and provide recommendations.  6:31 AM Delta trop has trended upward, now 68 (from 25).  Patient remains in NSR without chest pain.  Likely just from her AFIB RVR at high  rates.  Will give full dose ASA now.  Further assessment/managment as per cardioloygy.  Final Clinical Impression(s) / ED Diagnoses Final diagnoses:  Paroxysmal atrial fibrillation ([place])  Hypomagnesemia    Rx / DC Orders ED Discharge Orders    None       Larene Pickett, PA-C 09/20/19 [place], Delice Bison, DO 09/20/19 [phone]

## 2019-09-20 NOTE — ED Provider Notes (Signed)
Care assumed from L. Baird Cancer PA-C at shift change pending cards consult to determine disposition.  See her note for full H&P.   Briefly this is 74 yo female compliant with eliquis presenting with chief complaint of palpitations. This is second episode this month of afib RVR with rate in the 200s, daughter concerned. Pre hospital care includes diltiazem, adenosine, amiodarone. Patient was last cardioverted x 2 weeks ago, recently on pred and azithro for pneumonia. Work up and evaluation significant for Trop went 25-->68, thought to be related to high rate. Hypomagnesia, replaced with IV mag. Patient was consented to cardioversion by night team yet she self converted. Chest xray unremarkable. Sees cardiologist Dr. Sallyanne Kuster. Currently HDS.   Physical Exam  BP (!) 109/47 (BP Location: Right Arm)   Pulse 75   Temp 97.8 F (36.6 C) (Oral)   Resp (!) 22   SpO2 95%   Physical Exam PE: Constitutional: well-developed, well-nourished, no apparent distress HENT: normocephalic, atraumatic. no cervical adenopathy Cardiovascular: rhythm irregularly irregular. Rate in the 60s Pulmonary/Chest: effort normal Abdominal: soft and nontender Musculoskeletal: full ROM, no edema Neurological: alert with goal directed thinking Skin: warm and dry, no rash, no diaphoresis Psychiatric: normal mood and affect, normal behavior   ED Course/Procedures     Procedures  Results for orders placed or performed during the hospital encounter of 09/20/19 (from the past 24 hour(s))  CBC with Differential     Status: Abnormal   Collection Time: 09/20/19  2:25 AM  Result Value Ref Range   WBC 10.3 4.0 - 10.5 K/uL   RBC 4.19 3.87 - 5.11 MIL/uL   Hemoglobin 12.7 12.0 - 15.0 g/dL   HCT 39.7 36 - 46 %   MCV 94.7 80.0 - 100.0 fL   MCH 30.3 26.0 - 34.0 pg   MCHC 32.0 30.0 - 36.0 g/dL   RDW 14.6 11.5 - 15.5 %   Platelets 335 150 - 400 K/uL   nRBC 0.0 0.0 - 0.2 %   Neutrophils Relative % 75 %   Neutro Abs 7.7 1.7 - 7.7  K/uL   Lymphocytes Relative 14 %   Lymphs Abs 1.4 0.7 - 4.0 K/uL   Monocytes Relative 10 %   Monocytes Absolute 1.0 0 - 1 K/uL   Eosinophils Relative 0 %   Eosinophils Absolute 0.0 0 - 0 K/uL   Basophils Relative 0 %   Basophils Absolute 0.0 0 - 0 K/uL   Immature Granulocytes 1 %   Abs Immature Granulocytes 0.08 (H) 0.00 - 0.07 K/uL  Basic metabolic panel     Status: Abnormal   Collection Time: 09/20/19  2:25 AM  Result Value Ref Range   Sodium 137 135 - 145 mmol/L   Potassium 5.2 (H) 3.5 - 5.1 mmol/L   Chloride 100 98 - 111 mmol/L   CO2 23 22 - 32 mmol/L   Glucose, Bld 276 (H) 70 - 99 mg/dL   BUN 29 (H) 8 - 23 mg/dL   Creatinine, Ser 1.04 (H) 0.44 - 1.00 mg/dL   Calcium 9.0 8.9 - 10.3 mg/dL   GFR calc non Af Amer 52 (L) >60 mL/min   GFR calc Af Amer >60 >60 mL/min   Anion gap 14 5 - 15  Troponin I (High Sensitivity)     Status: Abnormal   Collection Time: 09/20/19  2:25 AM  Result Value Ref Range   Troponin I (High Sensitivity) 25 (H) <18 ng/L  Magnesium     Status: Abnormal   Collection  Time: 09/20/19  2:25 AM  Result Value Ref Range   Magnesium 1.2 (L) 1.7 - 2.4 mg/dL  Brain natriuretic peptide     Status: Abnormal   Collection Time: 09/20/19  2:26 AM  Result Value Ref Range   B Natriuretic Peptide 575.7 (H) 0.0 - 100.0 pg/mL  Troponin I (High Sensitivity)     Status: Abnormal   Collection Time: 09/20/19  4:18 AM  Result Value Ref Range   Troponin I (High Sensitivity) 68 (H) <18 ng/L    EKG Interpretation  Date/Time:  Tuesday September 20 2019 08:42:14 EDT Ventricular Rate:  60 PR Interval:    QRS Duration: 155 QT Interval:  495 QTC Calculation: 495 R Axis:   43 Text Interpretation: Sinus rhythm Left bundle branch block since last tracing no significant change Confirmed by Noemi Chapel [phone]) on 09/20/2019 8:58:31 AM      PORTABLE CHEST 1 VIEW    COMPARISON: Chest radiograph dated 09/05/2019.    FINDINGS:  There is background of emphysema and chronic  interstitial  coarsening. No focal consolidation, pleural effusion, or  pneumothorax. Mild cardiomegaly. Aortic valve repair.  Atherosclerotic calcification of the aorta. No acute osseous  pathology. Left shoulder arthroplasty.    IMPRESSION:  1. No acute cardiopulmonary process.  2. Mild cardiomegaly.      Electronically Signed  By: Anner Crete M.D.  On: 09/20/2019 02:05   Vitals:   09/20/19 0730 09/20/19 0930 09/20/19 1015 09/20/19 1137  BP: (!) 105/53 (!) 120/48 (!) 116/56 (!) 101/53  Pulse: (!) 59 65 60 63  Resp: 15 18 13 15   Temp:    97.9 F (36.6 C)  TempSrc:    Oral  SpO2: 96% 95% 96% 96%     MDM  Patient received in sign out at 0630. Please see previous provider note to include MDM up to this point.  Reached out to cardiology pending consultation. Patient was discussed by cardiology and ED attending Dr. Sabra Heck. Cardiology reviewed case and recommend discharge home as she has remained in sinus rhythm since self converting. She has an appointment scheduled with her cardiologist in x3 days that they advised her to keep as scheduled to talk about possibility of adding an anti-arrhythmic. Patient and daughter updated on plan of care and are agreeable. All questions answered. Patient is stable to be discharged home. Strict return precautions discussed.   Portions of this note were generated with Lobbyist. Dictation errors may occur despite best attempts at proofreading.     Flint Melter 09/20/19 1144    Noemi Chapel, MD 09/22/19 [phone]

## 2019-09-20 NOTE — ED Provider Notes (Signed)
Discussed the care with a cardiologist, they will follow up with the patient in the office, they will make the appointment, they recommend no changes to her medications at this time, she may need an antiarrhythmic but they want to discuss this with her in the office.  She is remained in normal sinus rhythm throughout the emergency department stay since change of shift 4 and half hours ago   Noemi Chapel, MD 09/20/19 1124

## 2019-09-20 NOTE — ED Notes (Signed)
Off floor to MRI

## 2019-09-23 ENCOUNTER — Other Ambulatory Visit: Payer: Self-pay

## 2019-09-23 ENCOUNTER — Ambulatory Visit (INDEPENDENT_AMBULATORY_CARE_PROVIDER_SITE_OTHER): Payer: Medicare Other | Admitting: Cardiovascular Disease

## 2019-09-23 ENCOUNTER — Encounter: Payer: Self-pay | Admitting: Cardiovascular Disease

## 2019-09-23 VITALS — BP 121/61 | HR 64 | Ht <= 58 in | Wt 179.4 lb

## 2019-09-23 DIAGNOSIS — I447 Left bundle-branch block, unspecified: Secondary | ICD-10-CM

## 2019-09-23 DIAGNOSIS — J9621 Acute and chronic respiratory failure with hypoxia: Secondary | ICD-10-CM

## 2019-09-23 DIAGNOSIS — I4891 Unspecified atrial fibrillation: Secondary | ICD-10-CM | POA: Diagnosis not present

## 2019-09-23 DIAGNOSIS — J449 Chronic obstructive pulmonary disease, unspecified: Secondary | ICD-10-CM

## 2019-09-23 DIAGNOSIS — E1169 Type 2 diabetes mellitus with other specified complication: Secondary | ICD-10-CM

## 2019-09-23 DIAGNOSIS — I10 Essential (primary) hypertension: Secondary | ICD-10-CM

## 2019-09-23 DIAGNOSIS — I342 Nonrheumatic mitral (valve) stenosis: Secondary | ICD-10-CM | POA: Diagnosis not present

## 2019-09-23 DIAGNOSIS — Z952 Presence of prosthetic heart valve: Secondary | ICD-10-CM

## 2019-09-23 DIAGNOSIS — I5032 Chronic diastolic (congestive) heart failure: Secondary | ICD-10-CM

## 2019-09-23 DIAGNOSIS — E669 Obesity, unspecified: Secondary | ICD-10-CM

## 2019-09-23 DIAGNOSIS — Z9989 Dependence on other enabling machines and devices: Secondary | ICD-10-CM

## 2019-09-23 DIAGNOSIS — G4733 Obstructive sleep apnea (adult) (pediatric): Secondary | ICD-10-CM

## 2019-09-23 MED ORDER — AMIODARONE HCL 200 MG PO TABS
ORAL_TABLET | ORAL | 0 refills | Status: DC
Start: 2019-09-23 — End: 2019-10-20

## 2019-09-23 NOTE — Patient Instructions (Signed)
Medication Instructions:  START Amiodarone 400 mg (two tablets) for two weeks and then take 200 mg once daily.  *If you need a refill on your cardiac medications before your next appointment, please call your pharmacy*   Lab Work: None ordered If you have labs (blood work) drawn today and your tests are completely normal, you will receive your results only by: Marland Kitchen MyChart Message (if you have MyChart) OR . A paper copy in the mail If you have any lab test that is abnormal or we need to change your treatment, we will call you to review the results.   Testing/Procedures: None ordered   Follow-Up: At [facility], you and your health needs are our priority.  As part of our continuing mission to provide you with exceptional heart care, we have created designated Provider Care Teams.  These Care Teams include your primary Cardiologist (physician) and Advanced Practice Providers (APPs -  Physician Assistants and Nurse Practitioners) who all work together to provide you with the care you need, when you need it.  We recommend signing up for the patient portal called "MyChart".  Sign up information is provided on this After Visit Summary.  MyChart is used to connect with patients for Virtual Visits (Telemedicine).  Patients are able to view lab/test results, encounter notes, upcoming appointments, etc.  Non-urgent messages can be sent to your provider as well.   To learn more about what you can do with MyChart, go to [email or web address].    Your next appointment:   Follow up in office with Dr. Sallyanne Kuster in 3 weeks or the Afib clinic.

## 2019-09-23 NOTE — Progress Notes (Signed)
Cardiology Office Note:    Date:  09/25/2019   ID:  Tanya [name], DOB 1945/10/17, MRN [id number]  PCP:  Zara Chess, NP  Cardiologist:  Sanda Klein, MD  Electrophysiologist:  None   Referring MD: Zara Chess, NP   Chief Complaint  Patient presents with  . Irregular Heart Beat  . Cardiac Valve Problem    History of Present Illness:    Tanya [name] is a 74 y.o. female with a hx of severe AS s/p TAVR (April 2020, 23 mm Edwards Sapien 3), paroxysmal atrial fibrillation, Eliquis anticoagulation, moderate degenerative mitral stenosis and insufficiency, morbid obesity, HTN, DM, OSA, COPD on O2 2l at night, LBBB, chronic doxycycline therapy for septic arthritis, s/p L mastectomy with residual lymphedema.  Over the last several weeks she has had a lot of problems with atrial fibrillation and heart failure.  She was hospitalized on June 26.  Not long after discharge she returned with symptomatic atrial fibrillation and underwent cardioversion in the emergency room on June 28.  She again had symptomatic atrial fibrillation on July 13, but converted spontaneously to normal rhythm after correction of hypomagnesemia before she underwent cardioversion.  Each time she becomes severely short of breath.  She continues have shortness of breath even when in sinus rhythm.  She reports seeing Dr. Freda Munro in [place] (pulmonary specialist) who told her that he believes her shortness of breath is due to heart problems.  Functional status was markedly improved since TAVR.  Echo in June 2021 showed a mean TAVR gradient of 14 mm Hg, normal LV function.  There was moderate mitral regurgitation.  The mean mitral gradient was 4 mmHg.  A 30 day event monitor was normal except for PACs. She did not have CAD at angiography pre-TAVR.  The patient specifically denies any chest pain at rest exertion, dyspnea at rest, orthopnea, paroxysmal nocturnal dyspnea, syncope, palpitations, focal neurological deficits,  intermittent claudication, lower extremity edema, unexplained weight gain, cough, hemoptysis or wheezing.   Past Medical History:  Diagnosis Date  . Asthma   . Breast cancer in female [facility])    1994 s/p chemo and radical L mastectomy  . Diabetes ([place])   . DVT (deep venous thrombosis) (HCC)    in shoulder after shoulder replacement  . Full dentures   . GERD (gastroesophageal reflux disease)   . Gout   . H/O total shoulder replacement   . History of knee replacement, total, bilateral   . 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 now on daily doxycycline.  Marland Kitchen HTN (hypertension)   . Lymphedema of left arm    s/p mastectomy with lymph node removal for breast cancer  . Morbid obesity ([place])   . Neuropathy   . OSA on CPAP   . Ovarian cancer ([place])    2012 s/p chemo and radical hysterectomy  . PAF (paroxysmal atrial fibrillation) (HCC)    on Eliquis  . Pulmonary nodules    seen on pre TAVR CT scan. needs follow up CT in 06/2019  . S/P TAVR (transcatheter aortic valve replacement)    23 mm Edwards Sapien 3 transcatheter heart valve placed via percutaneous right transfemoral approach   . Severe aortic stenosis   . Urine incontinence     Past Surgical History:  Procedure Laterality Date  . MASTECTOMY Left 1993  . RIGHT/LEFT HEART CATH AND CORONARY ANGIOGRAPHY N/A 06/25/2018   Procedure: RIGHT/LEFT HEART CATH AND CORONARY ANGIOGRAPHY;  Surgeon:  Belva Crome, MD;  Location: [place] [facility];  Service: Cardiovascular;  Laterality: N/A;  . TEE WITHOUT CARDIOVERSION N/A 06/29/2018   Procedure: TRANSESOPHAGEAL ECHOCARDIOGRAM (TEE);  Surgeon: Sherren Mocha, MD;  Location: [facility];  Service: Open Heart Surgery;  Laterality: N/A;  . TRANSCATHETER AORTIC VALVE REPLACEMENT, TRANSFEMORAL N/A 06/29/2018   Procedure: TRANSCATHETER AORTIC VALVE REPLACEMENT, TRANSFEMORAL;  Surgeon: Sherren Mocha, MD;  Location: [facility];  Service: Open Heart Surgery;   Laterality: N/A;    Current Medications: Current Meds  Medication Sig  . acetaminophen (TYLENOL) 325 MG tablet Take 650 mg by mouth every 6 (six) hours as needed for mild pain or fever.  Marland Kitchen apixaban (ELIQUIS) 5 MG TABS tablet Take 5 mg by mouth 2 (two) times daily.  Marland Kitchen atorvastatin (LIPITOR) 40 MG tablet Take 40 mg by mouth daily.  Marland Kitchen azelastine (ASTELIN) 0.1 % nasal spray Place 1 spray into both nostrils 2 (two) times daily. Use in each nostril as directed  . calcium carbonate (OS-CAL - DOSED IN MG OF ELEMENTAL CALCIUM) 1250 (500 Ca) MG tablet Take 1 tablet by mouth 2 (two) times daily with a meal.  . doxycycline (VIBRA-TABS) 100 MG tablet Take 100 mg by mouth 2 (two) times daily.  . DULoxetine (CYMBALTA) 30 MG capsule Take 30 mg by mouth 2 (two) times daily.  Marland Kitchen esomeprazole (NEXIUM) 40 MG capsule Take 40 mg by mouth daily.   . fexofenadine (ALLEGRA) 180 MG tablet Take 180 mg by mouth daily.  . fluticasone furoate-vilanterol (BREO ELLIPTA) 200-25 MCG/INH AEPB Inhale 1 puff into the lungs daily.  . furosemide (LASIX) 40 MG tablet Take 1 tablet (40 mg) in the morning. May take additional 1 tablet in the afternoon as needed for leg swelling, SOB and/or > 2 lbs weight gain. (Patient taking differently: Take 20 mg by mouth daily. )  . metFORMIN (GLUCOPHAGE) 500 MG tablet Take 1,000 mg by mouth 2 (two) times daily with a meal.  . metoprolol succinate (TOPROL XL) 25 MG 24 hr tablet Take 1 tablet (25 mg total) by mouth in the morning and at bedtime. Take with or immediately following a meal.  . montelukast (SINGULAIR) 10 MG tablet Take 10 mg by mouth at bedtime.  . Multiple Vitamin (MULTIVITAMIN WITH MINERALS) TABS tablet Take 1 tablet by mouth daily.  . potassium chloride (KLOR-CON) 10 MEQ tablet Take 1 tablet (10 mEq total) by mouth daily.  . sitaGLIPtin (JANUVIA) 50 MG tablet Take 50 mg by mouth daily.   . vitamin E 100 UNIT capsule Take 100 Units by mouth daily.     Allergies:   Fentanyl,  Pneumovax [pneumococcal polysaccharide vaccine], Sulfa antibiotics, and Levaquin [levofloxacin]   Social History   Socioeconomic History  . Marital status: Single    Spouse name: Not on file  . Number of children: Not on file  . Years of education: Not on file  . Highest education level: Not on file  Occupational History  . Not on file  Tobacco Use  . Smoking status: Former Smoker    Types: Cigarettes  . Smokeless tobacco: Never Used  Vaping Use  . Vaping Use: Never used  Substance and Sexual Activity  . Alcohol use: Never  . Drug use: Never  . Sexual activity: Not on file  Other Topics Concern  . Not on file  Social History Narrative   Divorced many years ago, recently moved in with daughter   Social Determinants of Health   Financial Resource Strain:   .  Difficulty of Paying Living Expenses:   Food Insecurity:   . Worried About Charity fundraiser in the Last Year:   . Arboriculturist in the Last Year:   Transportation Needs:   . Film/video editor (Medical):   Marland Kitchen Lack of Transportation (Non-Medical):   Physical Activity:   . Days of Exercise per Week:   . Minutes of Exercise per Session:   Stress:   . Feeling of Stress :   Social Connections:   . Frequency of Communication with Friends and Family:   . Frequency of Social Gatherings with Friends and Family:   . Attends Religious Services:   . Active Member of Clubs or Organizations:   . Attends Archivist Meetings:   Marland Kitchen Marital Status:      Family History: The patient's family history includes Heart disease in her father; Hypertension in her mother; Renal cancer in her brother.  ROS:   Please see the history of present illness.     All other systems reviewed and are negative.  EKGs/Labs/Other Studies Reviewed:    The following studies were reviewed today: Notes from hospitalization in June and 2 subsequent ED visits ECHO 09/03/2019 1. Left ventricular ejection fraction, by estimation, is 60  to 65%. The  left ventricle has normal function. The left ventricle has no regional  wall motion abnormalities. There is severe left ventricular hypertrophy.  Left ventricular diastolic parameters  are consistent with Grade I diastolic dysfunction (impaired relaxation).  2. Right ventricular systolic function is normal. The right ventricular  size is normal. Mildly increased right ventricular wall thickness. There  is mildly elevated pulmonary artery systolic pressure. The estimated right  ventricular systolic pressure is  38.2 mmHg.  3. Left atrial size was mildly dilated.  4. The mitral valve is abnormal, thickened and calcified with marked  annular calcification. Moderate mitral valve regurgitation. The mean  mitral valve gradient is 4.0 mmHg suggesting potential mild stenosis.  5. Tricuspid valve regurgitation is mild to moderate.  6. The aortic valve has been repaired/replaced. Aortic valve  regurgitation is not visualized. There is a 23 mm Edwards Sapien  prosthetic (TAVR) valve present in the aortic position. no paravalvular  leak. Aortic valve mean gradient measures 14.0 mmHg  (stable).  7. The inferior vena cava is normal in size with greater than 50%  respiratory variability, suggesting right atrial pressure of 3 mmHg.   30 day event monitor 2020  Normal sinus rhythm with normal circadian variation.  Rare isolated premature atrial beats are seen.  No significant ventricular arrhythmia, atrial fibrillation or bradycardia.  One recorded "pause" appears to be artifactual due to undersensing related to a change in QRS morphology.  Multiple symptom driven recordings are presented, all show normal rhythm.   Normal 30 day event monitor. There is no correlation between the rhythm and symptoms.  EKG:  EKG is ordered today.  It shows normal sinus rhythm, left bundle branch block with QRS 146 ms, QTC 448 ms  Recent Labs: 09/05/2019: ALT 39 09/20/2019: B Natriuretic  Peptide 575.7; BUN 29; Creatinine, Ser 1.04; Hemoglobin 12.7; Magnesium 1.2; Platelets 335; Potassium 5.2; Sodium 137  Recent Lipid Panel    Component Value Date/Time   CHOL 176 06/25/2018 1223   TRIG 187 (H) 06/25/2018 1223   HDL 55 06/25/2018 1223   CHOLHDL 3.2 06/25/2018 1223   VLDL 37 06/25/2018 1223   LDLCALC 84 06/25/2018 1223    Physical Exam:    VS:  BP 121/61   Pulse 64   Ht 4' 8.75" (1.441 m)   Wt 179 lb 6.4 oz (81.4 kg)   SpO2 98%   BMI 39.16 kg/m     Wt Readings from Last 3 Encounters:  09/23/19 179 lb 6.4 oz (81.4 kg)  09/07/19 183 lb (83 kg)  09/05/19 182 lb (82.6 kg)     General: Alert, oriented x3, no distress, severely obese Head: no evidence of trauma, PERRL, EOMI, no exophtalmos or lid lag, no myxedema, no xanthelasma; normal ears, nose and oropharynx Neck: normal jugular venous pulsations and no hepatojugular reflux; brisk carotid pulses without delay and no carotid bruits Chest: clear to auscultation, no signs of consolidation by percussion or palpation, normal fremitus, symmetrical and full respiratory excursions Cardiovascular: normal position and quality of the apical impulse, regular rhythm, normal first and paradoxically split second heart sounds, 1/6 early peaking systolic ejection murmur in the aortic focus, 2/6 holosystolic murmur at the apex, no diastolic murmurs, rubs or gallops Abdomen: no tenderness or distention, no masses by palpation, no abnormal pulsatility or arterial bruits, normal bowel sounds, no hepatosplenomegaly Extremities: no clubbing, cyanosis or edema; 2+ radial, ulnar and brachial pulses bilaterally; 2+ right femoral, posterior tibial and dorsalis pedis pulses; 2+ left femoral, posterior tibial and dorsalis pedis pulses; no subclavian or femoral bruits Neurological: grossly nonfocal Psych: Normal mood and affect   ASSESSMENT:    1. Atrial fibrillation with RVR ([place])   2. History of transcatheter aortic valve replacement (TAVR)    3. Chronic diastolic heart failure ([place])   4. Mitral valve stenosis, non-rheumatic   5. OSA on CPAP   6. Chronic obstructive pulmonary disease, unspecified COPD type ([place])   7. Acute on chronic respiratory failure with hypoxia (HCC)   8. Essential hypertension   9. LBBB (left bundle branch block)   10. Hypomagnesemia   11. Diabetes mellitus type 2 in obese [facility])    PLAN:    In order of problems listed above:  1. AFib: Recurrent paroxysmal atrial fibrillation has been a significant source of illness recently.  Rate control has been a challenging dilemma since she has baseline bradycardia and sometimes very low diastolic blood pressure.  Also has evidence of conduction system disease (LBBB).  She is only taking a very low-dose of beta-blocker.  She took amiodarone without complications for a while for postoperative atrial fibrillation after TAVR.  We will start amiodarone 400 mg for 2 weeks then 200 mg daily.  Continue anticoagulation.   2. AS s/p TAVR: Stable normal gradients across the prosthesis. Discussed the need for additional antibiotic prophylaxis with dental procedures.  3. CHF: Today she appears to be euvolemic.  Decompensation recently appears to be related to atrial fibrillation.  This does suggest that her mitral stenosis and/or diastolic dysfunction is more significant than we may think. 4. MS: avoid tachycardia. 5. OSA/COPD: dilated PA c/w PAH, on CPAP and home O2. 6. HTN: Well-controlled 7. LBBB: no evidence of higher grade AV block on 30 day monitor 2020.  Will likely need to reduce or stop her beta-blocker after the amiodarone "kicks in".  Not a great pacemaker candidate since she has had Port-A-Cath twice on the right and has some degree of left arm lymphedema following left mastectomy.  She has also previously had a right internal jugular temporary central venous catheter. 8. HypoMg: on PPI and loop diuretic. Start MagOx 400 mg daily. 9. DM: last A1c 7.4%.   Medication  Adjustments/Labs and Tests Ordered: Current  medicines are reviewed at length with the patient today.  Concerns regarding medicines are outlined above.  Orders Placed This Encounter  Procedures  . EKG 12-Lead   Meds ordered this encounter  Medications  . amiodarone (PACERONE) 200 MG tablet    Sig: Take 400 mg (2 tablets) once daily for two weeks, then take 200 mg once daily.    Dispense:  45 tablet    Refill:  0    Patient Instructions  Medication Instructions:  START Amiodarone 400 mg (two tablets) for two weeks and then take 200 mg once daily.  *If you need a refill on your cardiac medications before your next appointment, please call your pharmacy*   Lab Work: None ordered If you have labs (blood work) drawn today and your tests are completely normal, you will receive your results only by: Marland Kitchen MyChart Message (if you have MyChart) OR . A paper copy in the mail If you have any lab test that is abnormal or we need to change your treatment, we will call you to review the results.   Testing/Procedures: None ordered   Follow-Up: At [facility] you and your health needs are our priority.  As part of our continuing mission to provide you with exceptional heart care, we have created designated Provider Care Teams.  These Care Teams include your primary Cardiologist (physician) and Advanced Practice Providers (APPs -  Physician Assistants and Nurse Practitioners) who all work together to provide you with the care you need, when you need it.  We recommend signing up for the patient portal called "MyChart".  Sign up information is provided on this After Visit Summary.  MyChart is used to connect with patients for Virtual Visits (Telemedicine).  Patients are able to view lab/test results, encounter notes, upcoming appointments, etc.  Non-urgent messages can be sent to your provider as well.   To learn more about what you can do with MyChart, go to [email or web address].    Your next  appointment:   Follow up in office with Dr. Sallyanne Kuster in 3 weeks or the Afib clinic.     Signed, Sanda Klein, MD  09/25/2019 3:40 PM    [facility]

## 2019-09-27 ENCOUNTER — Other Ambulatory Visit: Payer: Self-pay

## 2019-09-27 ENCOUNTER — Telehealth: Payer: Self-pay | Admitting: *Deleted

## 2019-09-27 MED ORDER — MAGNESIUM 400 MG PO TABS: ORAL_TABLET | ORAL | 3 refills | Status: AC

## 2019-09-27 NOTE — Telephone Encounter (Signed)
Patient's daughter is returning call.

## 2019-09-27 NOTE — Telephone Encounter (Signed)
Called patient spoke to her and daughter-  Advised of notes below. Sent RX to pharmacy- although they were told they could get OTC, they wanted me to send anyway.

## 2019-09-27 NOTE — Telephone Encounter (Signed)
-----   Message from Sanda Klein, MD sent at 09/25/2019  3:41 PM EDT ----- Please tell her I reviewed her records and I think she needs to start magnesium oxide 400 mg daily.  When she came into the emergency room with atrial fibrillation her magnesium level was very low.  This is likely due to the fact that she takes Nexium in addition to furosemide.  Please warn her that the magnesium can make her stools looser.

## 2019-09-27 NOTE — Telephone Encounter (Signed)
Left a message for the patient to call back.  

## 2019-10-20 ENCOUNTER — Ambulatory Visit (INDEPENDENT_AMBULATORY_CARE_PROVIDER_SITE_OTHER): Payer: Medicare Other | Admitting: Cardiovascular Disease

## 2019-10-20 ENCOUNTER — Other Ambulatory Visit: Payer: Self-pay

## 2019-10-20 ENCOUNTER — Encounter: Payer: Self-pay | Admitting: Cardiovascular Disease

## 2019-10-20 VITALS — BP 118/56 | HR 56 | Ht <= 58 in | Wt 177.4 lb

## 2019-10-20 DIAGNOSIS — E1159 Type 2 diabetes mellitus with other circulatory complications: Secondary | ICD-10-CM

## 2019-10-20 DIAGNOSIS — Z952 Presence of prosthetic heart valve: Secondary | ICD-10-CM

## 2019-10-20 DIAGNOSIS — I4891 Unspecified atrial fibrillation: Secondary | ICD-10-CM | POA: Diagnosis not present

## 2019-10-20 DIAGNOSIS — G4733 Obstructive sleep apnea (adult) (pediatric): Secondary | ICD-10-CM

## 2019-10-20 DIAGNOSIS — I10 Essential (primary) hypertension: Secondary | ICD-10-CM

## 2019-10-20 DIAGNOSIS — I5033 Acute on chronic diastolic (congestive) heart failure: Secondary | ICD-10-CM | POA: Diagnosis not present

## 2019-10-20 DIAGNOSIS — I050 Rheumatic mitral stenosis: Secondary | ICD-10-CM | POA: Diagnosis not present

## 2019-10-20 DIAGNOSIS — I447 Left bundle-branch block, unspecified: Secondary | ICD-10-CM

## 2019-10-20 DIAGNOSIS — J449 Chronic obstructive pulmonary disease, unspecified: Secondary | ICD-10-CM

## 2019-10-20 DIAGNOSIS — J9611 Chronic respiratory failure with hypoxia: Secondary | ICD-10-CM

## 2019-10-20 DIAGNOSIS — E7800 Pure hypercholesterolemia, unspecified: Secondary | ICD-10-CM

## 2019-10-20 DIAGNOSIS — Z9989 Dependence on other enabling machines and devices: Secondary | ICD-10-CM

## 2019-10-20 MED ORDER — FUROSEMIDE 40 MG PO TABS: 40.0000 mg | ORAL_TABLET | Freq: Every day | ORAL | 1 refills | Status: AC

## 2019-10-20 MED ORDER — ATORVASTATIN CALCIUM 40 MG PO TABS: 40.0000 mg | ORAL_TABLET | Freq: Every day | ORAL | 1 refills | Status: AC

## 2019-10-20 MED ORDER — METOPROLOL SUCCINATE ER 25 MG PO TB24: 25.0000 mg | ORAL_TABLET | Freq: Every day | ORAL | 1 refills | Status: AC

## 2019-10-20 MED ORDER — AMIODARONE HCL 200 MG PO TABS: 200.0000 mg | ORAL_TABLET | Freq: Every day | ORAL | 1 refills | Status: AC

## 2019-10-20 NOTE — Progress Notes (Signed)
Cardiology Office Note:    Date:  10/21/2019   ID:  Tanya [name], DOB 01-Oct-1945, MRN [id number]  PCP:  Zara Chess, NP  Cardiologist:  Sanda Klein, MD  Electrophysiologist:  None   Referring MD: Zara Chess, NP   Chief Complaint  Patient presents with  . Atrial Fibrillation    History of Present Illness:    Tanya [name] is a 74 y.o. female with a hx of severe AS s/p TAVR (April 2020, 23 mm Edwards Sapien 3), paroxysmal atrial fibrillation, Eliquis anticoagulation, moderate degenerative mitral stenosis and insufficiency, morbid obesity, HTN, DM, OSA, COPD on O2 2l at night, LBBB, chronic doxycycline therapy for septic arthritis, s/p L mastectomy with residual lymphedema.  She is much improved since her last appointment.  She has had fewer problems with atrial fibrillation and heart failure.  She had 2 hospitalizations for symptomatic atrial fibrillation and 2 separate cardioversions in June.  Episodes of atrial fibrillation are associated with marked shortness of breath.  However, dyspnea persist to some degree even when she is in sinus rhythm.  She is now on amiodarone 200 mg once daily as a maintenance dose.  She is relatively bradycardic with a heart rate of 56 bpm, but she has not had dizziness, lightheadedness or syncope.  She is not having issues with edema, orthopnea or PND since her last visit.  Her weight has been very steady at 176-178 pounds on her home scale, which seems to vary closely match our office scale.  On furosemide 40 mg daily.  She has not required additional doses.  Functional status was markedly improved since TAVR.  Echo in June 2021 showed a mean TAVR gradient of 14 mm Hg, normal LV function.  There was moderate mitral regurgitation.  The mean mitral gradient was 4 mmHg.  A 30 day event monitor was normal except for PACs. She did not have CAD at angiography pre-TAVR.   Past Medical History:  Diagnosis Date  . Asthma   . Breast cancer in female [facility])     1994 s/p chemo and radical L mastectomy  . Diabetes ([place])   . DVT (deep venous thrombosis) (HCC)    in shoulder after shoulder replacement  . Full dentures   . GERD (gastroesophageal reflux disease)   . Gout   . H/O total shoulder replacement   . History of knee replacement, total, bilateral   . 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 now on daily doxycycline.  Marland Kitchen HTN (hypertension)   . Lymphedema of left arm    s/p mastectomy with lymph node removal for breast cancer  . Morbid obesity ([place])   . Neuropathy   . OSA on CPAP   . Ovarian cancer ([place])    2012 s/p chemo and radical hysterectomy  . PAF (paroxysmal atrial fibrillation) (HCC)    on Eliquis  . Pulmonary nodules    seen on pre TAVR CT scan. needs follow up CT in 06/2019  . S/P TAVR (transcatheter aortic valve replacement)    23 mm Edwards Sapien 3 transcatheter heart valve placed via percutaneous right transfemoral approach   . Severe aortic stenosis   . Urine incontinence     Past Surgical History:  Procedure Laterality Date  . MASTECTOMY Left 1993  . RIGHT/LEFT HEART CATH AND CORONARY ANGIOGRAPHY N/A 06/25/2018   Procedure: RIGHT/LEFT HEART CATH AND CORONARY ANGIOGRAPHY;  Surgeon: Belva Crome, MD;  Location: [place] [facility];  Service: Cardiovascular;  Laterality: N/A;  . TEE WITHOUT CARDIOVERSION N/A 06/29/2018   Procedure: TRANSESOPHAGEAL ECHOCARDIOGRAM (TEE);  Surgeon: Sherren Mocha, MD;  Location: [place];  Service: Open Heart Surgery;  Laterality: N/A;  . TRANSCATHETER AORTIC VALVE REPLACEMENT, TRANSFEMORAL N/A 06/29/2018   Procedure: TRANSCATHETER AORTIC VALVE REPLACEMENT, TRANSFEMORAL;  Surgeon: Sherren Mocha, MD;  Location: [place];  Service: Open Heart Surgery;  Laterality: N/A;    Current Medications: Current Meds  Medication Sig  . acetaminophen (TYLENOL) 325 MG tablet Take 650 mg by mouth every 6 (six) hours as needed for mild pain or fever.  Marland Kitchen  amiodarone (PACERONE) 200 MG tablet Take 1 tablet (200 mg total) by mouth daily.  Marland Kitchen apixaban (ELIQUIS) 5 MG TABS tablet Take 5 mg by mouth 2 (two) times daily.  Marland Kitchen atorvastatin (LIPITOR) 40 MG tablet Take 1 tablet (40 mg total) by mouth daily.  Marland Kitchen azelastine (ASTELIN) 0.1 % nasal spray Place 1 spray into both nostrils 2 (two) times daily. Use in each nostril as directed  . calcium carbonate (OS-CAL - DOSED IN MG OF ELEMENTAL CALCIUM) 1250 (500 Ca) MG tablet Take 1 tablet by mouth 2 (two) times daily with a meal.  . doxycycline (VIBRA-TABS) 100 MG tablet Take 100 mg by mouth 2 (two) times daily.  . DULoxetine (CYMBALTA) 30 MG capsule Take 30 mg by mouth 2 (two) times daily.  Marland Kitchen esomeprazole (NEXIUM) 40 MG capsule Take 40 mg by mouth daily.   . fexofenadine (ALLEGRA) 180 MG tablet Take 180 mg by mouth daily.  . fluticasone furoate-vilanterol (BREO ELLIPTA) 200-25 MCG/INH AEPB Inhale 1 puff into the lungs daily.  . furosemide (LASIX) 40 MG tablet Take 1 tablet (40 mg total) by mouth daily.  . Magnesium 400 MG TABS Take 1 tablet 400 mg by mouth once daily  . metFORMIN (GLUCOPHAGE) 500 MG tablet Take 1,000 mg by mouth 2 (two) times daily with a meal.  . metoprolol succinate (TOPROL XL) 25 MG 24 hr tablet Take 1 tablet (25 mg total) by mouth daily. Take with or immediately following a meal.  . montelukast (SINGULAIR) 10 MG tablet Take 10 mg by mouth at bedtime.  . Multiple Vitamin (MULTIVITAMIN WITH MINERALS) TABS tablet Take 1 tablet by mouth daily.  . potassium chloride (KLOR-CON) 10 MEQ tablet Take 1 tablet (10 mEq total) by mouth daily.  . sitaGLIPtin (JANUVIA) 50 MG tablet Take 50 mg by mouth daily.   . vitamin E 100 UNIT capsule Take 100 Units by mouth daily.  . [DISCONTINUED] amiodarone (PACERONE) 200 MG tablet Take 400 mg (2 tablets) once daily for two weeks, then take 200 mg once daily.  . [DISCONTINUED] atorvastatin (LIPITOR) 40 MG tablet Take 40 mg by mouth daily.  . [DISCONTINUED] furosemide  (LASIX) 40 MG tablet Take 1 tablet (40 mg) in the morning. May take additional 1 tablet in the afternoon as needed for leg swelling, SOB and/or > 2 lbs weight gain. (Patient taking differently: Take 20 mg by mouth daily. )  . [DISCONTINUED] metoprolol succinate (TOPROL XL) 25 MG 24 hr tablet Take 1 tablet (25 mg total) by mouth in the morning and at bedtime. Take with or immediately following a meal.     Allergies:   Fentanyl, Pneumovax [pneumococcal polysaccharide vaccine], Sulfa antibiotics, and Levaquin [levofloxacin]   Social History   Socioeconomic History  . Marital status: Single    Spouse name: Not on file  . Number of children: Not on file  . Years of education: Not  on file  . Highest education level: Not on file  Occupational History  . Not on file  Tobacco Use  . Smoking status: Former Smoker    Types: Cigarettes  . Smokeless tobacco: Never Used  Vaping Use  . Vaping Use: Never used  Substance and Sexual Activity  . Alcohol use: Never  . Drug use: Never  . Sexual activity: Not on file  Other Topics Concern  . Not on file  Social History Narrative   Divorced many years ago, recently moved in with daughter   Social Determinants of Health   Financial Resource Strain:   . Difficulty of Paying Living Expenses:   Food Insecurity:   . Worried About Charity fundraiser in the Last Year:   . Arboriculturist in the Last Year:   Transportation Needs:   . Film/video editor (Medical):   Marland Kitchen Lack of Transportation (Non-Medical):   Physical Activity:   . Days of Exercise per Week:   . Minutes of Exercise per Session:   Stress:   . Feeling of Stress :   Social Connections:   . Frequency of Communication with Friends and Family:   . Frequency of Social Gatherings with Friends and Family:   . Attends Religious Services:   . Active Member of Clubs or Organizations:   . Attends Archivist Meetings:   Marland Kitchen Marital Status:      Family History: The patient's  family history includes Heart disease in her father; Hypertension in her mother; Renal cancer in her brother.  ROS:   Please see the history of present illness.    All other systems are reviewed and are negative.   EKGs/Labs/Other Studies Reviewed:    The following studies were reviewed today: Notes from hospitalization in June and 2 subsequent ED visits ECHO 09/03/2019 1. Left ventricular ejection fraction, by estimation, is 60 to 65%. The  left ventricle has normal function. The left ventricle has no regional  wall motion abnormalities. There is severe left ventricular hypertrophy.  Left ventricular diastolic parameters  are consistent with Grade I diastolic dysfunction (impaired relaxation).  2. Right ventricular systolic function is normal. The right ventricular  size is normal. Mildly increased right ventricular wall thickness. There  is mildly elevated pulmonary artery systolic pressure. The estimated right  ventricular systolic pressure is  97.6 mmHg.  3. Left atrial size was mildly dilated.  4. The mitral valve is abnormal, thickened and calcified with marked  annular calcification. Moderate mitral valve regurgitation. The mean  mitral valve gradient is 4.0 mmHg suggesting potential mild stenosis.  5. Tricuspid valve regurgitation is mild to moderate.  6. The aortic valve has been repaired/replaced. Aortic valve  regurgitation is not visualized. There is a 23 mm Edwards Sapien  prosthetic (TAVR) valve present in the aortic position. no paravalvular  leak. Aortic valve mean gradient measures 14.0 mmHg  (stable).  7. The inferior vena cava is normal in size with greater than 50%  respiratory variability, suggesting right atrial pressure of 3 mmHg.   30 day event monitor 2020  Normal sinus rhythm with normal circadian variation.  Rare isolated premature atrial beats are seen.  No significant ventricular arrhythmia, atrial fibrillation or bradycardia.  One  recorded "pause" appears to be artifactual due to undersensing related to a change in QRS morphology.  Multiple symptom driven recordings are presented, all show normal rhythm.   Normal 30 day event monitor. There is no correlation between the rhythm and  symptoms.  EKG:  EKG is ordered today.  It shows normal sinus rhythm, left bundle branch block with QRS 146 ms, QTC 448 ms  Recent Labs: 09/05/2019: ALT 39 09/20/2019: B Natriuretic Peptide 575.7; BUN 29; Creatinine, Ser 1.04; Hemoglobin 12.7; Magnesium 1.2; Platelets 335; Potassium 5.2; Sodium 137 10/20/2019: TSH 3.340  Recent Lipid Panel    Component Value Date/Time   CHOL 176 06/25/2018 1223   TRIG 187 (H) 06/25/2018 1223   HDL 55 06/25/2018 1223   CHOLHDL 3.2 06/25/2018 1223   VLDL 37 06/25/2018 1223   LDLCALC 84 06/25/2018 1223    Physical Exam:    VS:  BP (!) 118/56   Pulse (!) 56   Ht 4' 8.75" (1.441 m)   Wt 177 lb 6.4 oz (80.5 kg)   SpO2 96%   BMI 38.73 kg/m     Wt Readings from Last 3 Encounters:  10/20/19 177 lb 6.4 oz (80.5 kg)  09/23/19 179 lb 6.4 oz (81.4 kg)  09/07/19 183 lb (83 kg)     General: Alert, oriented x3, no distress, severely obese, appears rather frail. Head: no evidence of trauma, PERRL, EOMI, no exophtalmos or lid lag, no myxedema, no xanthelasma; normal ears, nose and oropharynx Neck: normal jugular venous pulsations and no hepatojugular reflux; brisk carotid pulses without delay and no carotid bruits Chest: clear to auscultation, no signs of consolidation by percussion or palpation, normal fremitus, symmetrical and full respiratory excursions Cardiovascular: normal position and quality of the apical impulse, regular rhythm, normal first and second heart sounds, 1/6 early peaking systolic ejection murmur in the aortic focus, 2/6 apical holosystolic murmur radiating to the axilla, no diastolic murmurs, rubs or gallops Abdomen: no tenderness or distention, no masses by palpation, no abnormal  pulsatility or arterial bruits, normal bowel sounds, no hepatosplenomegaly Extremities: no clubbing, cyanosis or edema; 2+ radial, ulnar and brachial pulses bilaterally; 2+ right femoral, posterior tibial and dorsalis pedis pulses; 2+ left femoral, posterior tibial and dorsalis pedis pulses; no subclavian or femoral bruits Neurological: grossly nonfocal Psych: Normal mood and affect   ASSESSMENT:    1. Atrial fibrillation with RVR ([place])   2. S/P TAVR (transcatheter aortic valve replacement)   3. Acute on chronic diastolic CHF (congestive heart failure) ([place])   4. Moderate mitral stenosis   5. OSA on CPAP   6. Chronic obstructive pulmonary disease, unspecified COPD type ([place])   7. Chronic hypoxemic respiratory failure (HCC)   8. Essential hypertension   9. LBBB (left bundle branch block)   10. Hypomagnesemia   11. Type 2 diabetes mellitus with other circulatory complication, without long-term current use of insulin ([place])   12. Hypercholesterolemia    PLAN:    In order of problems listed above:  1. AFib: Atrial fibrillation episodes appear to be settling down after initiation of amiodarone.  Now on maintenance dose 200 mg daily.  Becoming more bradycardic as the medication "kicks in".  We will cut back further on her metoprolol to 25 mg once daily only.  Expect that we will have to discontinue it altogether in another few weeks.  Continue anticoagulation.   2. AS s/p TAVR: Stable gradients across the prosthesis.  Aware of the need for endocarditis prophylaxis. 3. CHF: Appears to be maintaining euvolemia on a low-dose of loop diuretic.  Reinforced the need for sodium restriction.  Daily weight monitoring.  She is predictably decompensated when she has atrial fibrillation, suggesting that her mitral stenosis and/or diastolic dysfunction are more significant than  previously appreciated. 4. MS: Mean gradient 4 mm at rest when bradycardic. 5. OSA/COPD: dilated PA c/w PAH, on CPAP and home O2.   Unclear to what degree her pulmonary problems versus her left heart problems are the cause of her pulmonary hypertension, but clearly the pulmonary disease is a significant contributor in my opinion. 6. HTN: Well-controlled and I suspect will remain so even when we cut back on her beta-blocker. 7. LBBB: no evidence of higher grade AV block on 30 day monitor 2020.  Unfortunately she is at risk for developing higher degrees of AV block as the amiodarone reaches steady state.  Anticipate will be stopping her beta-blocker altogether soon.  Not a great pacemaker candidate since she has had Port-A-Cath twice on the right and has some degree of left arm lymphedema following left mastectomy.  She has also previously had a right internal jugular temporary central venous catheter. 8. HypoMg: Hypomagnesemia appeared to be the cause of her most recent episode of atrial fibrillation and we started a magnesium supplement.  She is taking both proton pump inhibitors and loop diuretics. 9. DM: last A1c 7.4%.  Adequate control. 10. HLP: We will refill her atorvastatin   Medication Adjustments/Labs and Tests Ordered: Current medicines are reviewed at length with the patient today.  Concerns regarding medicines are outlined above.  Orders Placed This Encounter  Procedures  . TSH  . T4, free  . EKG 12-Lead   Meds ordered this encounter  Medications  . furosemide (LASIX) 40 MG tablet    Sig: Take 1 tablet (40 mg total) by mouth daily.    Dispense:  90 tablet    Refill:  1  . amiodarone (PACERONE) 200 MG tablet    Sig: Take 1 tablet (200 mg total) by mouth daily.    Dispense:  90 tablet    Refill:  1  . metoprolol succinate (TOPROL XL) 25 MG 24 hr tablet    Sig: Take 1 tablet (25 mg total) by mouth daily. Take with or immediately following a meal.    Dispense:  90 tablet    Refill:  1  . atorvastatin (LIPITOR) 40 MG tablet    Sig: Take 1 tablet (40 mg total) by mouth daily.    Dispense:  90 tablet     Refill:  1    Patient Instructions  Medication Instructions:  DECREASE the Metoprolol Succinate to 25 mg once daily TAKE Furosemide 40 mg once daily  *If you need a refill on your cardiac medications before your next appointment, please call your pharmacy*   Lab Work: Your provider would like for you to have the following labs today: TSH and Free T4  If you have labs (blood work) drawn today and your tests are completely normal, you will receive your results only by: Marland Kitchen MyChart Message (if you have MyChart) OR . A paper copy in the mail If you have any lab test that is abnormal or we need to change your treatment, we will call you to review the results.   Testing/Procedures: None ordered   Follow-Up: At [facility], you and your health needs are our priority.  As part of our continuing mission to provide you with exceptional heart care, we have created designated Provider Care Teams.  These Care Teams include your primary Cardiologist (physician) and Advanced Practice Providers (APPs -  Physician Assistants and Nurse Practitioners) who all work together to provide you with the care you need, when you need it.  We recommend  signing up for the patient portal called "MyChart".  Sign up information is provided on this After Visit Summary.  MyChart is used to connect with patients for Virtual Visits (Telemedicine).  Patients are able to view lab/test results, encounter notes, upcoming appointments, etc.  Non-urgent messages can be sent to your provider as well.   To learn more about what you can do with MyChart, go to [email or web address].    Your next appointment:   3 month(s)  The format for your next appointment:   In Person  Provider:   You may see Sanda Klein, MD or one of the following Advanced Practice Providers on your designated Care Team:    Almyra Deforest, PA-C  Fabian Sharp, [place] or   Roby Lofts, PA-C       Signed, Sanda Klein, MD  10/21/2019 5:33 PM     [place]

## 2019-10-20 NOTE — Patient Instructions (Signed)
Medication Instructions:  DECREASE the Metoprolol Succinate to 25 mg once daily TAKE Furosemide 40 mg once daily  *If you need a refill on your cardiac medications before your next appointment, please call your pharmacy*   Lab Work: Your provider would like for you to have the following labs today: TSH and Free T4  If you have labs (blood work) drawn today and your tests are completely normal, you will receive your results only by: Marland Kitchen MyChart Message (if you have MyChart) OR . A paper copy in the mail If you have any lab test that is abnormal or we need to change your treatment, we will call you to review the results.   Testing/Procedures: None ordered   Follow-Up: At [facility], you and your health needs are our priority.  As part of our continuing mission to provide you with exceptional heart care, we have created designated Provider Care Teams.  These Care Teams include your primary Cardiologist (physician) and Advanced Practice Providers (APPs -  Physician Assistants and Nurse Practitioners) who all work together to provide you with the care you need, when you need it.  We recommend signing up for the patient portal called "MyChart".  Sign up information is provided on this After Visit Summary.  MyChart is used to connect with patients for Virtual Visits (Telemedicine).  Patients are able to view lab/test results, encounter notes, upcoming appointments, etc.  Non-urgent messages can be sent to your provider as well.   To learn more about what you can do with MyChart, go to [email or web address].    Your next appointment:   3 month(s)  The format for your next appointment:   In Person  Provider:   You may see Sanda Klein, MD or one of the following Advanced Practice Providers on your designated Care Team:    Almyra Deforest, PA-C  Fabian Sharp, PA-C or   Roby Lofts, [place]

## 2019-10-21 LAB — T4, FREE: Free T4: 1.22 ng/dL (ref 0.82–1.77)

## 2019-10-21 LAB — TSH: TSH: 3.34 u[IU]/mL (ref 0.450–4.500)

## 2019-11-07 ENCOUNTER — Emergency Department (HOSPITAL_COMMUNITY): Payer: Medicare Other

## 2019-11-07 ENCOUNTER — Telehealth: Payer: Self-pay | Admitting: Cardiovascular Disease

## 2019-11-07 ENCOUNTER — Inpatient Hospital Stay (HOSPITAL_COMMUNITY)
Admission: EM | Admit: 2019-11-07 | Discharge: 2019-11-11 | DRG: 177 | Disposition: A | Discharge status: Home / Self Care | Payer: Medicare Other | Attending: Internal Medicine | Admitting: Internal Medicine

## 2019-11-07 ENCOUNTER — Other Ambulatory Visit: Payer: Self-pay

## 2019-11-07 DIAGNOSIS — E1140 Type 2 diabetes mellitus with diabetic neuropathy, unspecified: Secondary | ICD-10-CM | POA: Diagnosis present

## 2019-11-07 DIAGNOSIS — J9602 Acute respiratory failure with hypercapnia: Secondary | ICD-10-CM | POA: Diagnosis present

## 2019-11-07 DIAGNOSIS — K219 Gastro-esophageal reflux disease without esophagitis: Secondary | ICD-10-CM | POA: Diagnosis present

## 2019-11-07 DIAGNOSIS — E785 Hyperlipidemia, unspecified: Secondary | ICD-10-CM | POA: Diagnosis present

## 2019-11-07 DIAGNOSIS — Z6839 Body mass index (BMI) 39.0-39.9, adult: Secondary | ICD-10-CM | POA: Diagnosis not present

## 2019-11-07 DIAGNOSIS — I480 Paroxysmal atrial fibrillation: Secondary | ICD-10-CM | POA: Diagnosis present

## 2019-11-07 DIAGNOSIS — Z952 Presence of prosthetic heart valve: Secondary | ICD-10-CM | POA: Diagnosis not present

## 2019-11-07 DIAGNOSIS — Z8249 Family history of ischemic heart disease and other diseases of the circulatory system: Secondary | ICD-10-CM

## 2019-11-07 DIAGNOSIS — E669 Obesity, unspecified: Secondary | ICD-10-CM | POA: Diagnosis present

## 2019-11-07 DIAGNOSIS — I890 Lymphedema, not elsewhere classified: Secondary | ICD-10-CM | POA: Diagnosis present

## 2019-11-07 DIAGNOSIS — Z8543 Personal history of malignant neoplasm of ovary: Secondary | ICD-10-CM

## 2019-11-07 DIAGNOSIS — J1282 Pneumonia due to coronavirus disease 2019: Secondary | ICD-10-CM | POA: Diagnosis present

## 2019-11-07 DIAGNOSIS — E1165 Type 2 diabetes mellitus with hyperglycemia: Secondary | ICD-10-CM | POA: Diagnosis present

## 2019-11-07 DIAGNOSIS — R5381 Other malaise: Secondary | ICD-10-CM | POA: Diagnosis present

## 2019-11-07 DIAGNOSIS — Z8051 Family history of malignant neoplasm of kidney: Secondary | ICD-10-CM

## 2019-11-07 DIAGNOSIS — R339 Retention of urine, unspecified: Secondary | ICD-10-CM | POA: Diagnosis not present

## 2019-11-07 DIAGNOSIS — I110 Hypertensive heart disease with heart failure: Secondary | ICD-10-CM | POA: Diagnosis present

## 2019-11-07 DIAGNOSIS — G4733 Obstructive sleep apnea (adult) (pediatric): Secondary | ICD-10-CM | POA: Diagnosis present

## 2019-11-07 DIAGNOSIS — I5033 Acute on chronic diastolic (congestive) heart failure: Secondary | ICD-10-CM | POA: Diagnosis present

## 2019-11-07 DIAGNOSIS — U071 COVID-19: Secondary | ICD-10-CM | POA: Diagnosis present

## 2019-11-07 DIAGNOSIS — J441 Chronic obstructive pulmonary disease with (acute) exacerbation: Secondary | ICD-10-CM | POA: Diagnosis present

## 2019-11-07 DIAGNOSIS — Z96612 Presence of left artificial shoulder joint: Secondary | ICD-10-CM | POA: Diagnosis present

## 2019-11-07 DIAGNOSIS — J9621 Acute and chronic respiratory failure with hypoxia: Secondary | ICD-10-CM | POA: Diagnosis present

## 2019-11-07 DIAGNOSIS — M109 Gout, unspecified: Secondary | ICD-10-CM | POA: Diagnosis present

## 2019-11-07 DIAGNOSIS — J9622 Acute and chronic respiratory failure with hypercapnia: Secondary | ICD-10-CM | POA: Diagnosis present

## 2019-11-07 DIAGNOSIS — Z853 Personal history of malignant neoplasm of breast: Secondary | ICD-10-CM

## 2019-11-07 DIAGNOSIS — Z9012 Acquired absence of left breast and nipple: Secondary | ICD-10-CM

## 2019-11-07 DIAGNOSIS — J440 Chronic obstructive pulmonary disease with acute lower respiratory infection: Secondary | ICD-10-CM | POA: Diagnosis present

## 2019-11-07 DIAGNOSIS — E872 Acidosis: Secondary | ICD-10-CM | POA: Diagnosis present

## 2019-11-07 DIAGNOSIS — Z7901 Long term (current) use of anticoagulants: Secondary | ICD-10-CM

## 2019-11-07 DIAGNOSIS — I10 Essential (primary) hypertension: Secondary | ICD-10-CM | POA: Diagnosis not present

## 2019-11-07 DIAGNOSIS — Z87891 Personal history of nicotine dependence: Secondary | ICD-10-CM

## 2019-11-07 DIAGNOSIS — E1159 Type 2 diabetes mellitus with other circulatory complications: Secondary | ICD-10-CM

## 2019-11-07 DIAGNOSIS — Z96653 Presence of artificial knee joint, bilateral: Secondary | ICD-10-CM | POA: Diagnosis present

## 2019-11-07 DIAGNOSIS — Z9221 Personal history of antineoplastic chemotherapy: Secondary | ICD-10-CM

## 2019-11-07 LAB — CBC WITH DIFFERENTIAL/PLATELET
Abs Immature Granulocytes: 0.25 10*3/uL — ABNORMAL HIGH (ref 0.00–0.07)
Basophils Absolute: 0.1 10*3/uL (ref 0.0–0.1)
Basophils Relative: 1 %
Eosinophils Absolute: 0.1 10*3/uL (ref 0.0–0.5)
Eosinophils Relative: 1 %
HCT: 39.4 % (ref 36.0–46.0)
Hemoglobin: 11.9 g/dL — ABNORMAL LOW (ref 12.0–15.0)
Immature Granulocytes: 2 %
Lymphocytes Relative: 32 %
Lymphs Abs: 3.9 10*3/uL (ref 0.7–4.0)
MCH: 30.4 pg (ref 26.0–34.0)
MCHC: 30.2 g/dL (ref 30.0–36.0)
MCV: 100.5 fL — ABNORMAL HIGH (ref 80.0–100.0)
Monocytes Absolute: 1.1 10*3/uL — ABNORMAL HIGH (ref 0.1–1.0)
Monocytes Relative: 9 %
Neutro Abs: 6.7 10*3/uL (ref 1.7–7.7)
Neutrophils Relative %: 55 %
Platelets: 321 10*3/uL (ref 150–400)
RBC: 3.92 MIL/uL (ref 3.87–5.11)
RDW: 15.7 % — ABNORMAL HIGH (ref 11.5–15.5)
WBC: 12.1 10*3/uL — ABNORMAL HIGH (ref 4.0–10.5)
nRBC: 0.2 % (ref 0.0–0.2)

## 2019-11-07 LAB — CBG MONITORING, ED: Glucose-Capillary: 165 mg/dL — ABNORMAL HIGH (ref 70–99)

## 2019-11-07 LAB — COMPREHENSIVE METABOLIC PANEL
ALT: 29 U/L (ref 0–44)
AST: 32 U/L (ref 15–41)
Albumin: 3.5 g/dL (ref 3.5–5.0)
Alkaline Phosphatase: 51 U/L (ref 38–126)
Anion gap: 17 — ABNORMAL HIGH (ref 5–15)
BUN: 13 mg/dL (ref 8–23)
CO2: 18 mmol/L — ABNORMAL LOW (ref 22–32)
Calcium: 8.3 mg/dL — ABNORMAL LOW (ref 8.9–10.3)
Chloride: 98 mmol/L (ref 98–111)
Creatinine, Ser: 0.89 mg/dL (ref 0.44–1.00)
GFR calc Af Amer: >60 mL/min (ref >60)
GFR calc non Af Amer: >60 mL/min (ref >60)
Glucose, Bld: 378 mg/dL — ABNORMAL HIGH (ref 70–99)
Potassium: 5.3 mmol/L — ABNORMAL HIGH (ref 3.5–5.1)
Sodium: 133 mmol/L — ABNORMAL LOW (ref 135–145)
Total Bilirubin: 1.1 mg/dL (ref 0.3–1.2)
Total Protein: 7.1 g/dL (ref 6.5–8.1)

## 2019-11-07 LAB — I-STAT ARTERIAL BLOOD GAS, ED
Acid-Base Excess: 0 mmol/L (ref 0.0–2.0)
Acid-base deficit: 4 mmol/L — ABNORMAL HIGH (ref 0.0–2.0)
Bicarbonate: 26.2 mmol/L (ref 20.0–28.0)
Bicarbonate: 25.7 mmol/L (ref 20.0–28.0)
Calcium, Ion: 1.15 mmol/L (ref 1.15–1.40)
Calcium, Ion: 1.09 mmol/L — ABNORMAL LOW (ref 1.15–1.40)
HCT: 36 % (ref 36.0–46.0)
HCT: 36 % (ref 36.0–46.0)
Hemoglobin: 12.2 g/dL (ref 12.0–15.0)
Hemoglobin: 12.2 g/dL (ref 12.0–15.0)
O2 Saturation: 97 %
O2 Saturation: 98 %
Patient temperature: 101
Potassium: 5.5 mmol/L — ABNORMAL HIGH (ref 3.5–5.1)
Potassium: 4.6 mmol/L (ref 3.5–5.1)
Sodium: 133 mmol/L — ABNORMAL LOW (ref 135–145)
Sodium: 135 mmol/L (ref 135–145)
TCO2: 29 mmol/L (ref 22–32)
TCO2: 27 mmol/L (ref 22–32)
pCO2 arterial: 77.8 mmHg (ref 32.0–48.0)
pCO2 arterial: 47.5 mmHg (ref 32.0–48.0)
pH, Arterial: 7.136 — CL (ref 7.350–7.450)
pH, Arterial: 7.348 — ABNORMAL LOW (ref 7.350–7.450)
pO2, Arterial: 129 mmHg — ABNORMAL HIGH (ref 83.0–108.0)
pO2, Arterial: 118 mmHg — ABNORMAL HIGH (ref 83.0–108.0)

## 2019-11-07 LAB — GLUCOSE, CAPILLARY: Glucose-Capillary: 158 mg/dL — ABNORMAL HIGH (ref 70–99)

## 2019-11-07 LAB — LACTIC ACID, PLASMA
Lactic Acid, Venous: 4.3 mmol/L (ref 0.5–1.9)
Lactic Acid, Venous: 2.2 mmol/L (ref 0.5–1.9)

## 2019-11-07 LAB — TROPONIN I (HIGH SENSITIVITY): Troponin I (High Sensitivity): 18 ng/L — ABNORMAL HIGH (ref <18)

## 2019-11-07 LAB — BRAIN NATRIURETIC PEPTIDE: B Natriuretic Peptide: 481.7 pg/mL — ABNORMAL HIGH (ref 0.0–100.0)

## 2019-11-07 LAB — FIBRINOGEN: Fibrinogen: 668 mg/dL — ABNORMAL HIGH (ref 210–475)

## 2019-11-07 LAB — D-DIMER, QUANTITATIVE: D-Dimer, Quant: 0.63 ug/mL-FEU — ABNORMAL HIGH (ref 0.00–0.50)

## 2019-11-07 LAB — LACTATE DEHYDROGENASE: LDH: 223 U/L — ABNORMAL HIGH (ref 98–192)

## 2019-11-07 LAB — C-REACTIVE PROTEIN: CRP: 7.3 mg/dL — ABNORMAL HIGH (ref <1.0)

## 2019-11-07 LAB — FERRITIN: Ferritin: 220 ng/mL (ref 11–307)

## 2019-11-07 LAB — PROCALCITONIN: Procalcitonin: <0.1 ng/mL

## 2019-11-07 LAB — TRIGLYCERIDES: Triglycerides: 90 mg/dL (ref <150)

## 2019-11-07 MED ORDER — ONDANSETRON HCL 4 MG PO TABS: 4.0000 mg | ORAL_TABLET | Freq: Four times a day (QID) | ORAL | Status: DC | PRN

## 2019-11-07 MED ORDER — INSULIN ASPART 100 UNIT/ML ~~LOC~~ SOLN
0.0000 [IU] | SUBCUTANEOUS | Status: DC
  Administered 2019-11-07 – 2019-11-08 (×3): 4 [IU] via SUBCUTANEOUS
  Administered 2019-11-08: 7 [IU] via SUBCUTANEOUS
  Administered 2019-11-08: 3 [IU] via SUBCUTANEOUS
  Administered 2019-11-08: 4 [IU] via SUBCUTANEOUS
  Administered 2019-11-09 (×2): 3 [IU] via SUBCUTANEOUS
  Administered 2019-11-09: 7 [IU] via SUBCUTANEOUS
  Administered 2019-11-09 – 2019-11-10 (×3): 4 [IU] via SUBCUTANEOUS
  Administered 2019-11-10 (×2): 7 [IU] via SUBCUTANEOUS
  Administered 2019-11-10: 3 [IU] via SUBCUTANEOUS
  Administered 2019-11-11: 4 [IU] via SUBCUTANEOUS
  Administered 2019-11-11: 3 [IU] via SUBCUTANEOUS

## 2019-11-07 MED ORDER — SODIUM CHLORIDE 0.9 % IV SOLN
100.0000 mg | Freq: Every day | INTRAVENOUS | Status: AC
  Administered 2019-11-08 – 2019-11-11 (×4): 100 mg via INTRAVENOUS
  Filled 2019-11-07 (×6): qty 20

## 2019-11-07 MED ORDER — APIXABAN 5 MG PO TABS
5.0000 mg | ORAL_TABLET | Freq: Two times a day (BID) | ORAL | Status: DC
  Administered 2019-11-08 – 2019-11-11 (×7): 5 mg via ORAL
  Filled 2019-11-07 (×7): qty 1

## 2019-11-07 MED ORDER — INSULIN DETEMIR 100 UNIT/ML ~~LOC~~ SOLN
6.0000 [IU] | Freq: Two times a day (BID) | SUBCUTANEOUS | Status: DC
  Administered 2019-11-07 – 2019-11-11 (×8): 6 [IU] via SUBCUTANEOUS
  Filled 2019-11-07 (×9): qty 0.06

## 2019-11-07 MED ORDER — SENNA 8.6 MG PO TABS
1.0000 | ORAL_TABLET | Freq: Two times a day (BID) | ORAL | Status: DC
  Administered 2019-11-08 – 2019-11-10 (×3): 8.6 mg via ORAL
  Filled 2019-11-07 (×7): qty 1

## 2019-11-07 MED ORDER — ASCORBIC ACID 500 MG PO TABS
500.0000 mg | ORAL_TABLET | Freq: Every day | ORAL | Status: DC
  Administered 2019-11-08 – 2019-11-11 (×4): 500 mg via ORAL
  Filled 2019-11-07 (×4): qty 1

## 2019-11-07 MED ORDER — CHLORHEXIDINE GLUCONATE CLOTH 2 % EX PADS
6.0000 | MEDICATED_PAD | Freq: Every day | CUTANEOUS | Status: DC
  Administered 2019-11-07 – 2019-11-11 (×4): 6 via TOPICAL

## 2019-11-07 MED ORDER — ACETAMINOPHEN 325 MG PO TABS: 650.0000 mg | ORAL_TABLET | Freq: Four times a day (QID) | ORAL | Status: DC | PRN

## 2019-11-07 MED ORDER — DULOXETINE HCL 20 MG PO CPEP
20.0000 mg | ORAL_CAPSULE | Freq: Every day | ORAL | Status: DC
  Administered 2019-11-08 – 2019-11-11 (×4): 20 mg via ORAL
  Filled 2019-11-07 (×4): qty 1

## 2019-11-07 MED ORDER — PANTOPRAZOLE SODIUM 40 MG IV SOLR
40.0000 mg | Freq: Every day | INTRAVENOUS | Status: DC
  Administered 2019-11-07 – 2019-11-09 (×3): 40 mg via INTRAVENOUS
  Filled 2019-11-07 (×3): qty 40

## 2019-11-07 MED ORDER — METHYLPREDNISOLONE SODIUM SUCC 125 MG IJ SOLR
60.0000 mg | Freq: Two times a day (BID) | INTRAMUSCULAR | Status: DC
  Administered 2019-11-07 – 2019-11-08 (×2): 60 mg via INTRAVENOUS
  Filled 2019-11-07 (×2): qty 2

## 2019-11-07 MED ORDER — SODIUM CHLORIDE 0.9 % IV SOLN
200.0000 mg | Freq: Once | INTRAVENOUS | Status: AC
  Administered 2019-11-07: 200 mg via INTRAVENOUS
  Filled 2019-11-07: qty 40

## 2019-11-07 MED ORDER — BARICITINIB 2 MG PO TABS
4.0000 mg | ORAL_TABLET | Freq: Every day | ORAL | Status: DC
  Administered 2019-11-08 – 2019-11-11 (×4): 4 mg via ORAL
  Filled 2019-11-07 (×5): qty 2

## 2019-11-07 MED ORDER — ZINC SULFATE 220 (50 ZN) MG PO CAPS
220.0000 mg | ORAL_CAPSULE | Freq: Every day | ORAL | Status: DC
  Administered 2019-11-08 – 2019-11-11 (×4): 220 mg via ORAL
  Filled 2019-11-07 (×4): qty 1

## 2019-11-07 MED ORDER — ONDANSETRON HCL 4 MG/2ML IJ SOLN: 4.0000 mg | Freq: Four times a day (QID) | INTRAMUSCULAR | Status: DC | PRN

## 2019-11-07 MED ORDER — MONTELUKAST SODIUM 10 MG PO TABS
10.0000 mg | ORAL_TABLET | Freq: Every day | ORAL | Status: DC
  Administered 2019-11-08 – 2019-11-10 (×3): 10 mg via ORAL
  Filled 2019-11-07 (×4): qty 1

## 2019-11-07 MED ORDER — HYDROCOD POLST-CPM POLST ER 10-8 MG/5ML PO SUER: 5.0000 mL | Freq: Two times a day (BID) | ORAL | Status: DC | PRN

## 2019-11-07 MED ORDER — DOCUSATE SODIUM 100 MG PO CAPS
100.0000 mg | ORAL_CAPSULE | Freq: Two times a day (BID) | ORAL | Status: DC
  Administered 2019-11-08 – 2019-11-10 (×3): 100 mg via ORAL
  Filled 2019-11-07 (×6): qty 1

## 2019-11-07 MED ORDER — GUAIFENESIN-DM 100-10 MG/5ML PO SYRP
10.0000 mL | ORAL_SOLUTION | ORAL | Status: DC | PRN
  Filled 2019-11-07: qty 10

## 2019-11-07 MED ORDER — ATORVASTATIN CALCIUM 40 MG PO TABS
40.0000 mg | ORAL_TABLET | Freq: Every day | ORAL | Status: DC
  Administered 2019-11-08 – 2019-11-11 (×4): 40 mg via ORAL
  Filled 2019-11-07 (×4): qty 1

## 2019-11-07 MED ORDER — LINAGLIPTIN 5 MG PO TABS
5.0000 mg | ORAL_TABLET | Freq: Every day | ORAL | Status: DC
  Administered 2019-11-08 – 2019-11-11 (×4): 5 mg via ORAL
  Filled 2019-11-07 (×4): qty 1

## 2019-11-07 MED ORDER — IPRATROPIUM-ALBUTEROL 0.5-2.5 (3) MG/3ML IN SOLN
3.0000 mL | Freq: Four times a day (QID) | RESPIRATORY_TRACT | Status: DC
  Administered 2019-11-08 (×2): 3 mL via RESPIRATORY_TRACT
  Filled 2019-11-07 (×2): qty 3

## 2019-11-07 MED ORDER — AMIODARONE HCL 200 MG PO TABS
200.0000 mg | ORAL_TABLET | Freq: Every day | ORAL | Status: DC
  Administered 2019-11-08 – 2019-11-11 (×4): 200 mg via ORAL
  Filled 2019-11-07 (×4): qty 1

## 2019-11-07 MED ORDER — FUROSEMIDE 10 MG/ML IJ SOLN
40.0000 mg | Freq: Once | INTRAMUSCULAR | Status: AC
  Administered 2019-11-07: 40 mg via INTRAVENOUS
  Filled 2019-11-07: qty 4

## 2019-11-07 NOTE — Telephone Encounter (Signed)
Patient's daughter calling in because her mother tested positive for covid. She is getting a regeneron infusion on Wednesday but states her mother is super sick and wants to know what Dr. Sallyanne Kuster recommends. Please advise.

## 2019-11-07 NOTE — ED Provider Notes (Addendum)
[facility] EMERGENCY DEPARTMENT Provider Note   CSN: [id number] Arrival date & time: 11/07/19  1839     History Chief Complaint  Patient presents with  . Respiratory Distress    Tanya [name] is a 74 y.o. female. Level 5 caveat due to altered mental status. HPI Patient presents with shortness of breath.  Patient really not able to provide much history due to dyspnea and potentially some altered mental status.  Reportedly had sats of 60% upon arrival.  Diagnosed with Covid 3 days ago.  Also history of CHF and has recently seen cardiology and had increase in her Lasix.    Past Medical History:  Diagnosis Date  . Asthma   . Breast cancer in female [facility])    1994 s/p chemo and radical L mastectomy  . Diabetes ([place])   . DVT (deep venous thrombosis) (HCC)    in shoulder after shoulder replacement  . Full dentures   . GERD (gastroesophageal reflux disease)   . Gout   . H/O total shoulder replacement   . History of knee replacement, total, bilateral   . 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 now on daily doxycycline.  Marland Kitchen HTN (hypertension)   . Lymphedema of left arm    s/p mastectomy with lymph node removal for breast cancer  . Morbid obesity ([place])   . Neuropathy   . OSA on CPAP   . Ovarian cancer ([place])    2012 s/p chemo and radical hysterectomy  . PAF (paroxysmal atrial fibrillation) (HCC)    on Eliquis  . Pulmonary nodules    seen on pre TAVR CT scan. needs follow up CT in 06/2019  . S/P TAVR (transcatheter aortic valve replacement)    23 mm Edwards Sapien 3 transcatheter heart valve placed via percutaneous right transfemoral approach   . Severe aortic stenosis   . Urine incontinence     Patient Active Problem List   Diagnosis Date Noted  . Need for vaccination 09/03/2019  . S/P TAVR (transcatheter aortic valve replacement)   . Pulmonary nodules   . 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 Acute on chronic respiratory failure with hypoxia ([place])   . Demand ischemia ([place])   . Moderate mitral stenosis   . Acute on chronic diastolic CHF (congestive heart failure) ([place])   . Urine incontinence   . Severe aortic stenosis   . History of septic arthritis   . PAF (paroxysmal atrial fibrillation) ([place])   . Ovarian cancer ([place])   . OSA on CPAP   . Neuropathy   . HTN (hypertension)   . History of knee replacement, total, bilateral   . H/O total shoulder replacement   . Gout   . GERD (gastroesophageal reflux disease)   . Full dentures   . Diabetes ([place])   . Breast cancer in female [facility])   . Asthma   . Lymphedema of left arm   . Morbid obesity ([place])   . Acute hypercapnic respiratory failure ([place]) 06/22/2018  . DKA (diabetic ketoacidoses) ([place]) 06/22/2018  . Elevated brain natriuretic peptide (BNP) level 06/22/2018  . Neutrophilic leukocytosis 89/21/1943    Past Surgical History:  Procedure Laterality Date  . MASTECTOMY Left 1993  . RIGHT/LEFT HEART CATH AND CORONARY ANGIOGRAPHY N/A 06/25/2018   Procedure: RIGHT/LEFT HEART CATH AND CORONARY ANGIOGRAPHY;  Surgeon: Belva Crome, MD;  Location: [facility];  Service: Cardiovascular;  Laterality:  N/A;  . TEE WITHOUT CARDIOVERSION N/A 06/29/2018   Procedure: TRANSESOPHAGEAL ECHOCARDIOGRAM (TEE);  Surgeon: Sherren Mocha, MD;  Location: [facility];  Service: Open Heart Surgery;  Laterality: N/A;  . TRANSCATHETER AORTIC VALVE REPLACEMENT, TRANSFEMORAL N/A 06/29/2018   Procedure: TRANSCATHETER AORTIC VALVE REPLACEMENT, TRANSFEMORAL;  Surgeon: Sherren Mocha, MD;  Location: [facility];  Service: Open Heart Surgery;  Laterality: N/A;     OB History   No obstetric history on file.     Family History  Problem Relation Age of Onset  . Hypertension Mother   . Heart disease Father   . Renal cancer Brother     Social History   Tobacco Use  . Smoking status: Former Smoker    Types: Cigarettes  .  Smokeless tobacco: Never Used  Vaping Use  . Vaping Use: Never used  Substance Use Topics  . Alcohol use: Never  . Drug use: Never    Home Medications Prior to Admission medications   Medication Sig Start Date End Date Taking? Authorizing Provider  acetaminophen (TYLENOL) 325 MG tablet Take 650 mg by mouth every 6 (six) hours as needed for mild pain or fever.   Yes [provider]  albuterol (PROVENTIL) (2.5 MG/3ML) 0.083% nebulizer solution Take 2.5 mg by nebulization every 4 (four) hours as needed for wheezing or shortness of breath.   Yes [provider]  albuterol (VENTOLIN HFA) 108 (90 Base) MCG/ACT inhaler Inhale 2 puffs into the lungs every 4 (four) hours as needed for wheezing or shortness of breath.    Yes [provider]  amiodarone (PACERONE) 200 MG tablet Take 1 tablet (200 mg total) by mouth daily. 10/20/19  Yes Croitoru, Mihai, MD  apixaban (ELIQUIS) 5 MG TABS tablet Take 5 mg by mouth 2 (two) times daily.   Yes [provider]  atorvastatin (LIPITOR) 40 MG tablet Take 1 tablet (40 mg total) by mouth daily. 10/20/19  Yes Croitoru, Mihai, MD  azelastine (ASTELIN) 0.1 % nasal spray Place 1 spray into both nostrils 2 (two) times daily as needed for rhinitis or allergies.    Yes [provider]  calcium carbonate (OS-CAL - DOSED IN MG OF ELEMENTAL CALCIUM) 1250 (500 Ca) MG tablet Take 1 tablet by mouth 2 (two) times daily with a meal.   Yes [provider]  doxycycline (VIBRA-TABS) 100 MG tablet Take 100 mg by mouth 2 (two) times daily.   Yes [provider]  DULoxetine (CYMBALTA) 30 MG capsule Take 30 mg by mouth 2 (two) times daily.   Yes [provider]  esomeprazole (NEXIUM) 40 MG capsule Take 40 mg by mouth daily before breakfast.    Yes [provider]  fexofenadine (ALLEGRA) 180 MG tablet Take 180 mg by mouth daily. 08/22/19  Yes [provider]  fluticasone furoate-vilanterol (BREO ELLIPTA)  200-25 MCG/INH AEPB Inhale 1 puff into the lungs daily.   Yes [provider]  furosemide (LASIX) 40 MG tablet Take 1 tablet (40 mg total) by mouth daily. Patient taking differently: Take 40 mg by mouth 2 (two) times daily.  10/20/19  Yes Croitoru, Mihai, MD  HYDROcodone-homatropine (HYCODAN) 5-1.5 MG/5ML syrup Take 5 mLs by mouth every 12 (twelve) hours as needed for cough. 11/04/19  Yes [provider]  Magnesium 400 MG TABS Take 1 tablet 400 mg by mouth once daily 09/27/19  Yes Croitoru, Mihai, MD  metFORMIN (GLUCOPHAGE) 500 MG tablet Take 1,000 mg by mouth 2 (two) times daily with a meal.  Yes [provider]  metoprolol succinate (TOPROL XL) 25 MG 24 hr tablet Take 1 tablet (25 mg total) by mouth daily. Take with or immediately following a meal. Patient taking differently: Take 25 mg by mouth in the morning and at bedtime. Take with or immediately following a meal. 10/20/19  Yes Croitoru, Mihai, MD  montelukast (SINGULAIR) 10 MG tablet Take 10 mg by mouth at bedtime.   Yes [provider]  Multiple Vitamin (MULTIVITAMIN WITH MINERALS) TABS tablet Take 1 tablet by mouth daily.   Yes [provider]  potassium chloride (KLOR-CON) 10 MEQ tablet Take 1 tablet (10 mEq total) by mouth daily. 12/16/18  Yes Croitoru, Mihai, MD  sitaGLIPtin (JANUVIA) 50 MG tablet Take 50 mg by mouth daily.    Yes [provider]  vitamin E 100 UNIT capsule Take 100 Units by mouth daily.   Yes [provider]    Allergies    Fentanyl, Pneumovax [pneumococcal polysaccharide vaccine], Pneumococcal vaccine, Sulfa antibiotics, Tape, Latex, and Levaquin [levofloxacin]  Review of Systems   Review of Systems  Physical Exam Updated Vital Signs BP (!) 172/93   Pulse (!) 103   Temp (!) 101 F (38.3 C) (Rectal)   Resp (!) 34   SpO2 96%   Physical Exam  ED Results / Procedures / Treatments   Labs (all labs ordered are listed, but only abnormal results are  displayed) Labs Reviewed  LACTIC ACID, PLASMA - Abnormal; Notable for the following components:      Result Value   Lactic Acid, Venous 4.3 (*)    All other components within normal limits  CBC WITH DIFFERENTIAL/PLATELET - Abnormal; Notable for the following components:   WBC 12.1 (*)    Hemoglobin 11.9 (*)    MCV 100.5 (*)    RDW 15.7 (*)    Monocytes Absolute 1.1 (*)    Abs Immature Granulocytes 0.25 (*)    All other components within normal limits  COMPREHENSIVE METABOLIC PANEL - Abnormal; Notable for the following components:   Sodium 133 (*)    Potassium 5.3 (*)    CO2 18 (*)    Glucose, Bld 378 (*)    Calcium 8.3 (*)    Anion gap 17 (*)    All other components within normal limits  D-DIMER, QUANTITATIVE (NOT AT [facility]) - Abnormal; Notable for the following components:   D-Dimer, Quant 0.63 (*)    All other components within normal limits  LACTATE DEHYDROGENASE - Abnormal; Notable for the following components:   LDH 223 (*)    All other components within normal limits  FIBRINOGEN - Abnormal; Notable for the following components:   Fibrinogen 668 (*)    All other components within normal limits  C-REACTIVE PROTEIN - Abnormal; Notable for the following components:   CRP 7.3 (*)    All other components within normal limits  BRAIN NATRIURETIC PEPTIDE - Abnormal; Notable for the following components:   B Natriuretic Peptide 481.7 (*)    All other components within normal limits  I-STAT ARTERIAL BLOOD GAS, ED - Abnormal; Notable for the following components:   pH, Arterial 7.136 (*)    pCO2 arterial 77.8 (*)    pO2, Arterial 129 (*)    Acid-base deficit 4.0 (*)    Sodium 133 (*)    Potassium 5.5 (*)    All other components within normal limits  TROPONIN I (HIGH SENSITIVITY) - Abnormal; Notable for the following components:   Troponin I (High Sensitivity) 18 (*)  All other components within normal limits  CULTURE, BLOOD (ROUTINE X 2)  CULTURE, BLOOD (ROUTINE X 2)    FERRITIN  TRIGLYCERIDES  LACTIC ACID, PLASMA  PROCALCITONIN    EKG EKG Interpretation  Date/Time:  Monday November 07 2019 18:58:18 EDT Ventricular Rate:  105 PR Interval:    QRS Duration: 172 QT Interval:  389 QTC Calculation: 515 R Axis:   12 Text Interpretation: Sinus or ectopic atrial tachycardia Left bundle branch block Baseline wander in lead(s) V1 Confirmed by Davonna Belling [phone]) on 11/07/2019 7:21:22 PM   Radiology DG Chest Port 1 View  Result Date: 11/07/2019 CLINICAL DATA:  Sudden onset respiratory distress, COVID-19 EXAM: PORTABLE CHEST 1 VIEW COMPARISON:  Radiograph 06/29/2018, CT 07/22/2019 FINDINGS: Widespread heterogeneous airspace opacities are present in both lungs with a basilar predominance as well as cephalized indistinct pulmonary vascularity. Small left effusion. No right effusion. No visible pneumothorax. Cardiomegaly, similar to priors. Evidence of prior TAVR. The aorta is calcified. The remaining cardiomediastinal contours are unremarkable. No acute osseous or soft tissue abnormality. Prior reverse left shoulder arthroplasty. Telemetry leads overlie the chest. IMPRESSION: Widespread heterogeneous airspace opacities in both lungs with a basilar predominance as well as cephalized pulmonary vascularity, could reflect pulmonary edema and/or multifocal pneumonia in the setting of COVID-19. Aortic Atherosclerosis (ICD10-I70.0). Electronically Signed   By: Lovena Le M.D.   On: 11/07/2019 19:34    Procedures Procedures (including critical care time)  Medications Ordered in ED Medications  furosemide (LASIX) injection 40 mg (40 mg Intravenous Given 11/07/19 1922)    ED Course  I have reviewed the triage vital signs and the nursing notes.  Pertinent labs & imaging results that were available during my care of the patient were reviewed by me and considered in my medical decision making (see chart for details).    MDM Rules/Calculators/A&P                           Patient brought in for shortness of breath.  Known positive Covid test 3 days ago.  Also has history of CHF however.  Does appear to be carrying extra fluid with edema on the legs.  Hypoxic but improved with oxygen.  However was also hypercarbic with mental status change.  Able answer some questions but is altered.  Give trial of BiPAP and some Lasix and hopefully will improve somewhat but still think she needs ICU level care at this point.  Will discuss with intensivist.  Patient does have fever and elevated lactic acid.  I think this is likely secondary to the Covid infection.  Not hypotensive.  I doubt this is a severe sepsis.  I think the lactic acid is elevated due to the hypoxia she had had.  Not activating sepsis protocol at this point as I think she is actually volume overloaded and Lasix has been given.  CRITICAL CARE Performed by: Davonna Belling Total critical care time: 30 minutes Critical care time was exclusive of separately billable procedures and treating other patients. Critical care was necessary to treat or prevent imminent or life-threatening deterioration. Critical care was time spent personally by me on the following activities: development of treatment plan with patient and/or surrogate as well as nursing, discussions with consultants, evaluation of patient's response to treatment, examination of patient, obtaining history from patient or surrogate, ordering and performing treatments and interventions, ordering and review of laboratory studies, ordering and review of radiographic studies, pulse oximetry and re-evaluation of patient's  condition.  Final Clinical Impression(s) / ED Diagnoses Final diagnoses:  COVID-19  Acute respiratory failure with hypercapnia [facility])    Rx / DC Orders ED Discharge Orders    None       Davonna Belling, MD 11/07/19 Raye Sorrow    Davonna Belling, MD 11/07/19 2011

## 2019-11-07 NOTE — Telephone Encounter (Signed)
Returned the call to the patient. She currently has COVID and cannot get the infusion until Wednesday. She was calling for anymore advice. She has been advised her to take deep breaths, get up to walk around the house a few times a day to help keep the lungs open, stay hydrated and to call PCP if symptoms worsen and secretions become discolored.

## 2019-11-07 NOTE — H&P (Signed)
NAME:  Tanya [name], MRN:  [id number], DOB:  1945/05/12, LOS: 0 ADMISSION DATE:  11/07/2019, CONSULTATION DATE:  8/30 REFERRING MD:  Dr. Alvino Chapel, CHIEF COMPLAINT:  8/30   Brief History   74 year old female with multiple comorbid conditions admitted 8/30 with acute on chronic hypercarbic/hypoxemic respiratory failure with ddx COVID-19 pneumonia, CHF, COPD exacerbation. On BiPAP in ED.   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 She had a recent COVID exposure to her grand-daughters just over one week prior to admission. Then on approximately 8/23 she began to feel ill including some dyspnea. This progressed over the course of the week and she ultimately presented for COVID-19 vaccination, which resulted as positive 8/27. Dypsnea progressed causing her to present to [facility] ED on 8/30. Upon arrival her O2 sats were 60% and she was immediately started on supplemental oxygen. She became lethargic. ABG demonstrated hypercarbia and BiPAP was started. Imaging concerning for pulmonary edema vs COVID pneumonia. Considering she was COVID positive on BiPAP PCCM was called.   Past Medical History   has a past medical history of Asthma, Breast cancer in female [facility]), Diabetes ([place]), DVT (deep venous thrombosis) ([place]), Full dentures, GERD (gastroesophageal reflux disease), Gout, H/O total shoulder replacement, History of knee replacement, total, bilateral, History of septic arthritis, HTN (hypertension), Lymphedema of left arm, Morbid obesity ([place]), Neuropathy, OSA on CPAP, Ovarian cancer ([place]), PAF (paroxysmal atrial fibrillation) ([place]), Pulmonary nodules, S/P TAVR (transcatheter aortic valve replacement), Severe aortic stenosis, and Urine incontinence.   Significant Hospital Events   9/30  admit  Consults:    Procedures:    Significant Diagnostic Tests:    Micro Data:  Blood 8/30 >  Antimicrobials:    Interim history/subjective:    Objective   Blood pressure (!) 172/93, pulse (!) 103, temperature (!) 101 F (38.3 C), temperature source Rectal, resp. rate (!) 34, SpO2 96 %.       No intake or output data in the 24 hours ending 11/07/19 2125 There were no vitals filed for this visit.  Examination: General: Obese female in NAD on BiPAP HENT: [place]/AT, PERRL, no JVD Lungs: Crackles, wheeze.  Cardiovascular: RRR, no MRG Abdomen: Protuberant, soft, non-tender Extremities: No acute deformity. No edema.  Neuro: Alert, oriented, non-focal  Resolved Hospital Problem list     Assessment & Plan:   Acute on chronic mixed respiratory failure: Multifactorial. She is positive for COVID-19 and likely has pneumonia related to this. It seems as though COPD exacerbation and decompensated OSA are playing a significant role in her presentation as well. Acute on chronic HFpEF also a likely contributor.  - Admit to ICU - Continue BiPAP overnight - Accept O2 sats > 85% if she is breathing comfortably.  - Solu-medrol 60mg  BID, remdesivir, baricitinib.  - Incentive spirometry and proning when not on BiPAP - Trend inflammatory markers.   OSA on CPAP - NIMV QHS, BiPAP for now.   Acute exacerbation of COPD - Steroids as above.  - Duoneb en lieu of home Breo  HFpEF +/- acute component Paroxysmal atrial fibrillation - Continue home Eliquis, amiodarone, metoprolol - Telemetry monitoring - Diuresis as indicated, holding scheduled home lasix.   DM Hyperglycemia - Levermir, SSI resistant, tradjenta  - Holding home metformin, januvia.   Lactic  acidosis: likely secondary to increased work of breathing. Has mostly cleared.  - Follow BMP  ** Based on ACTT-2, COV-BARRIER and other available trial data, baricitinib is being used under EUA by the FDA. The patient has no ESRD  or AKI, known history of TB, severe neutropenia (ANC <500) or lymphopenia (ALC <200), or severe LFT elevations. They are not on DMARDs or probenecid and are not pregnant. The option to use/refuse baricitinib treatment under FDA authorization (not approval), the significant known and potential risks and benefits, the extent to which these are unknown, and information regarding all available alternatives were discussed in detail. Specifically, the risk of VTE and secondary infections were discussed in detail with the patient and HCPOA. They consent to proceed with treatment.   Best practice:  Diet: Carb modified heart healthy Pain/Anxiety/Delirium protocol (if indicated): NA VAP protocol (if indicated): NA DVT prophylaxis: eliquis GI prophylaxis: PPI Glucose control: Levemir, SSI Mobility: BR Code Status: FULL Family Communication: Patient and daughter udated Disposition: ICU when a bed becomes available.   Labs   CBC: Recent Labs  Lab 11/07/19 1850 11/07/19 1857 11/07/19 2118  WBC 12.1*  --   --   NEUTROABS 6.7  --   --   HGB 11.9* 12.2 12.2  HCT 39.4 36.0 36.0  MCV 100.5*  --   --   PLT 321  --   --     Basic Metabolic Panel: Recent Labs  Lab 11/07/19 1850 11/07/19 1857 11/07/19 2118  NA 133* 133* 135  K 5.3* 5.5* 4.6  CL 98  --   --   CO2 18*  --   --   GLUCOSE 378*  --   --   BUN 13  --   --   CREATININE 0.89  --   --   CALCIUM 8.3*  --   --    GFR: CrCl cannot be calculated (Unknown ideal weight.). Recent Labs  Lab 11/07/19 1850  PROCALCITON <0.10  WBC 12.1*  LATICACIDVEN 4.3*    Liver Function Tests: Recent Labs  Lab 11/07/19 1850  AST 32  ALT 29  ALKPHOS 51  BILITOT 1.1  PROT 7.1  ALBUMIN 3.5   No results for input(s): LIPASE, AMYLASE in the last 168 hours. No results for input(s): AMMONIA in the last 168 hours.  ABG    Component Value Date/Time   PHART 7.348 (L) 11/07/2019 2118   PCO2ART 47.5 11/07/2019 2118   PO2ART 118 (H) 11/07/2019  2118   HCO3 25.7 11/07/2019 2118   TCO2 27 11/07/2019 2118   ACIDBASEDEF 4.0 (H) 11/07/2019 1857   O2SAT 98.0 11/07/2019 2118     Coagulation Profile: No results for input(s): INR, PROTIME in the last 168 hours.  Cardiac Enzymes: No results for input(s): CKTOTAL, CKMB, CKMBINDEX, TROPONINI in the last 168 hours.  HbA1C: Hgb A1c MFr Bld  Date/Time Value Ref Range Status  09/03/2019 09:40 AM 7.4 (H) 4.8 - 5.6 % Final    Comment:    (NOTE) Pre diabetes:          5.7%-6.4%  Diabetes:              >6.4%  Glycemic control for   <7.0% adults with diabetes   06/23/2018 09:30 AM 8.5 (H) 4.8 - 5.6 % Final    Comment:    (NOTE) Pre diabetes:          5.7%-6.4% Diabetes:              >6.4%  Glycemic control for   <7.0% adults with diabetes     CBG: No results for input(s): GLUCAP in the last 168 hours.  Review of Systems:   Bolds are positive  Constitutional: weight loss, gain, night sweats, Fevers, chills, fatigue .  HEENT: headaches, Sore throat, sneezing, nasal congestion, post nasal drip, Difficulty swallowing, Tooth/dental problems, visual complaints visual changes, ear ache CV:  chest pain, radiates:,Orthopnea, PND, swelling in lower extremities, dizziness, palpitations, syncope.  GI  heartburn, indigestion, abdominal pain, nausea, vomiting, diarrhea, change in bowel habits, loss of appetite, bloody stools.  Resp: cough, productive yellow: , hemoptysis, dyspnea, chest pain, pleuritic.  Skin: rash or itching or icterus GU: dysuria, change in color of urine, urgency or frequency. flank pain, hematuria  MS: joint pain or swelling. decreased range of motion  Psych: change in mood or affect. depression or anxiety.  Neuro: difficulty with speech, weakness, numbness, ataxia    Past Medical History  She,  has a past medical history of Asthma, Breast cancer in female [facility]), Diabetes ([place]), DVT (deep venous thrombosis) ([place]), Full dentures, GERD (gastroesophageal reflux disease),  Gout, H/O total shoulder replacement, History of knee replacement, total, bilateral, History of septic arthritis, HTN (hypertension), Lymphedema of left arm, Morbid obesity ([place]), Neuropathy, OSA on CPAP, Ovarian cancer ([place]), PAF (paroxysmal atrial fibrillation) ([place]), Pulmonary nodules, S/P TAVR (transcatheter aortic valve replacement), Severe aortic stenosis, and Urine incontinence.   Surgical History    Past Surgical History:  Procedure Laterality Date  . MASTECTOMY Left 1993  . RIGHT/LEFT HEART CATH AND CORONARY ANGIOGRAPHY N/A 06/25/2018   Procedure: RIGHT/LEFT HEART CATH AND CORONARY ANGIOGRAPHY;  Surgeon: Belva Crome, MD;  Location: [facility];  Service: Cardiovascular;  Laterality: N/A;  . TEE WITHOUT CARDIOVERSION N/A 06/29/2018   Procedure: TRANSESOPHAGEAL ECHOCARDIOGRAM (TEE);  Surgeon: Sherren Mocha, MD;  Location: [place];  Service: Open Heart Surgery;  Laterality: N/A;  . TRANSCATHETER AORTIC VALVE REPLACEMENT, TRANSFEMORAL N/A 06/29/2018   Procedure: TRANSCATHETER AORTIC VALVE REPLACEMENT, TRANSFEMORAL;  Surgeon: Sherren Mocha, MD;  Location: [place];  Service: Open Heart Surgery;  Laterality: N/A;     Social History   reports that she has quit smoking. Her smoking use included cigarettes. She has never used smokeless tobacco. She reports that she does not drink alcohol and does not use drugs.   Family History   Her family history includes Heart disease in her father; Hypertension in her mother; Renal cancer in her brother.   Allergies Allergies  Allergen Reactions  . Fentanyl Anaphylaxis  . Pneumovax [Pneumococcal Polysaccharide Vaccine] Swelling and Other (See Comments)    Arm swelling  . Pneumococcal Vaccine Swelling and Other (See Comments)    Arm swelling  . Sulfa Antibiotics Other (See Comments)    UNSPECIFIED CHILDHOOD REACTION  . Tape Other (See Comments)    Latex-containing Band-Aids PEEL OFF THE SKIN!!  . Latex Rash  . Levaquin [Levofloxacin] Rash      Home Medications  Prior to Admission medications   Medication Sig Start Date End Date Taking? Authorizing Provider  acetaminophen (TYLENOL) 325 MG tablet Take 650 mg by mouth every 6 (six) hours as needed for mild pain or fever.   Yes [provider]  albuterol (PROVENTIL) (2.5 MG/3ML) 0.083% nebulizer solution Take 2.5 mg by nebulization every 4 (four) hours as needed for wheezing or shortness of breath.   Yes [provider]  albuterol (VENTOLIN HFA) 108 (90 Base) MCG/ACT inhaler Inhale 2 puffs into the lungs every 4 (  four) hours as needed for wheezing or shortness of breath.    Yes [provider]  amiodarone (PACERONE) 200 MG tablet Take 1 tablet (200 mg total) by mouth daily. 10/20/19  Yes Croitoru, Mihai, MD  apixaban (ELIQUIS) 5 MG TABS tablet Take 5 mg by mouth 2 (two) times daily.   Yes [provider]  atorvastatin (LIPITOR) 40 MG tablet Take 1 tablet (40 mg total) by mouth daily. 10/20/19  Yes Croitoru, Mihai, MD  azelastine (ASTELIN) 0.1 % nasal spray Place 1 spray into both nostrils 2 (two) times daily as needed for rhinitis or allergies.    Yes [provider]  calcium carbonate (OS-CAL - DOSED IN MG OF ELEMENTAL CALCIUM) 1250 (500 Ca) MG tablet Take 1 tablet by mouth 2 (two) times daily with a meal.   Yes [provider]  doxycycline (VIBRA-TABS) 100 MG tablet Take 100 mg by mouth 2 (two) times daily.   Yes [provider]  DULoxetine (CYMBALTA) 30 MG capsule Take 30 mg by mouth 2 (two) times daily.   Yes [provider]  esomeprazole (NEXIUM) 40 MG capsule Take 40 mg by mouth daily before breakfast.    Yes [provider]  fexofenadine (ALLEGRA) 180 MG tablet Take 180 mg by mouth daily. 08/22/19  Yes [provider]  fluticasone furoate-vilanterol (BREO ELLIPTA) 200-25 MCG/INH AEPB Inhale 1 puff into the lungs daily.   Yes [provider]  furosemide (LASIX) 40 MG tablet Take 1  tablet (40 mg total) by mouth daily. Patient taking differently: Take 40 mg by mouth 2 (two) times daily.  10/20/19  Yes Croitoru, Mihai, MD  HYDROcodone-homatropine (HYCODAN) 5-1.5 MG/5ML syrup Take 5 mLs by mouth every 12 (twelve) hours as needed for cough. 11/04/19  Yes [provider]  Magnesium 400 MG TABS Take 1 tablet 400 mg by mouth once daily 09/27/19  Yes Croitoru, Mihai, MD  metFORMIN (GLUCOPHAGE) 500 MG tablet Take 1,000 mg by mouth 2 (two) times daily with a meal.   Yes [provider]  metoprolol succinate (TOPROL XL) 25 MG 24 hr tablet Take 1 tablet (25 mg total) by mouth daily. Take with or immediately following a meal. Patient taking differently: Take 25 mg by mouth in the morning and at bedtime. Take with or immediately following a meal. 10/20/19  Yes Croitoru, Mihai, MD  montelukast (SINGULAIR) 10 MG tablet Take 10 mg by mouth at bedtime.   Yes [provider]  Multiple Vitamin (MULTIVITAMIN WITH MINERALS) TABS tablet Take 1 tablet by mouth daily.   Yes [provider]  potassium chloride (KLOR-CON) 10 MEQ tablet Take 1 tablet (10 mEq total) by mouth daily. 12/16/18  Yes Croitoru, Mihai, MD  sitaGLIPtin (JANUVIA) 50 MG tablet Take 50 mg by mouth daily.    Yes [provider]  vitamin E 100 UNIT capsule Take 100 Units by mouth daily.   Yes [provider]     Critical care time: 46 minutes     Georgann Housekeeper, AGACNP-BC [place] for personal pager PCCM on call pager [phone]  11/07/2019 10:15 PM

## 2019-11-07 NOTE — ED Triage Notes (Signed)
Pt here in resp distress with SOB that started this afternoon. Pt covid pos 3 days ago. Pt received 2 nitro, 5mg  neb and arrived on Bipap.

## 2019-11-08 LAB — HEPATITIS B SURFACE ANTIGEN: Hepatitis B Surface Ag: NONREACTIVE

## 2019-11-08 LAB — COMPREHENSIVE METABOLIC PANEL
ALT: 27 U/L (ref 0–44)
AST: 30 U/L (ref 15–41)
Albumin: 3.2 g/dL — ABNORMAL LOW (ref 3.5–5.0)
Alkaline Phosphatase: 42 U/L (ref 38–126)
Anion gap: 14 (ref 5–15)
BUN: 14 mg/dL (ref 8–23)
CO2: 22 mmol/L (ref 22–32)
Calcium: 8.4 mg/dL — ABNORMAL LOW (ref 8.9–10.3)
Chloride: 101 mmol/L (ref 98–111)
Creatinine, Ser: 0.74 mg/dL (ref 0.44–1.00)
GFR calc Af Amer: >60 mL/min (ref >60)
GFR calc non Af Amer: >60 mL/min (ref >60)
Glucose, Bld: 199 mg/dL — ABNORMAL HIGH (ref 70–99)
Potassium: 4.6 mmol/L (ref 3.5–5.1)
Sodium: 137 mmol/L (ref 135–145)
Total Bilirubin: 0.9 mg/dL (ref 0.3–1.2)
Total Protein: 6.7 g/dL (ref 6.5–8.1)

## 2019-11-08 LAB — CBC WITH DIFFERENTIAL/PLATELET
Abs Immature Granulocytes: 0.04 10*3/uL (ref 0.00–0.07)
Basophils Absolute: 0 10*3/uL (ref 0.0–0.1)
Basophils Relative: 0 %
Eosinophils Absolute: 0 10*3/uL (ref 0.0–0.5)
Eosinophils Relative: 0 %
HCT: 34.6 % — ABNORMAL LOW (ref 36.0–46.0)
Hemoglobin: 11.4 g/dL — ABNORMAL LOW (ref 12.0–15.0)
Immature Granulocytes: 1 %
Lymphocytes Relative: 11 %
Lymphs Abs: 0.5 10*3/uL — ABNORMAL LOW (ref 0.7–4.0)
MCH: 31.5 pg (ref 26.0–34.0)
MCHC: 32.9 g/dL (ref 30.0–36.0)
MCV: 95.6 fL (ref 80.0–100.0)
Monocytes Absolute: 0.2 10*3/uL (ref 0.1–1.0)
Monocytes Relative: 5 %
Neutro Abs: 4.2 10*3/uL (ref 1.7–7.7)
Neutrophils Relative %: 83 %
Platelets: 226 10*3/uL (ref 150–400)
RBC: 3.62 MIL/uL — ABNORMAL LOW (ref 3.87–5.11)
RDW: 15.6 % — ABNORMAL HIGH (ref 11.5–15.5)
WBC: 5 10*3/uL (ref 4.0–10.5)
nRBC: 0 % (ref 0.0–0.2)

## 2019-11-08 LAB — SARS CORONAVIRUS 2 BY RT PCR (HOSPITAL ORDER, PERFORMED IN ~~LOC~~ HOSPITAL LAB): SARS Coronavirus 2: POSITIVE — AB

## 2019-11-08 LAB — GLUCOSE, CAPILLARY
Glucose-Capillary: 130 mg/dL — ABNORMAL HIGH (ref 70–99)
Glucose-Capillary: 132 mg/dL — ABNORMAL HIGH (ref 70–99)
Glucose-Capillary: 159 mg/dL — ABNORMAL HIGH (ref 70–99)
Glucose-Capillary: 174 mg/dL — ABNORMAL HIGH (ref 70–99)
Glucose-Capillary: 183 mg/dL — ABNORMAL HIGH (ref 70–99)
Glucose-Capillary: 204 mg/dL — ABNORMAL HIGH (ref 70–99)

## 2019-11-08 LAB — PHOSPHORUS: Phosphorus: 3.9 mg/dL (ref 2.5–4.6)

## 2019-11-08 LAB — MAGNESIUM: Magnesium: 1.2 mg/dL — ABNORMAL LOW (ref 1.7–2.4)

## 2019-11-08 LAB — C-REACTIVE PROTEIN: CRP: 8.8 mg/dL — ABNORMAL HIGH (ref <1.0)

## 2019-11-08 LAB — MRSA PCR SCREENING: MRSA by PCR: NEGATIVE

## 2019-11-08 LAB — TROPONIN I (HIGH SENSITIVITY): Troponin I (High Sensitivity): 78 ng/L — ABNORMAL HIGH (ref <18)

## 2019-11-08 LAB — FERRITIN: Ferritin: 520 ng/mL — ABNORMAL HIGH (ref 11–307)

## 2019-11-08 LAB — D-DIMER, QUANTITATIVE: D-Dimer, Quant: 0.92 ug/mL-FEU — ABNORMAL HIGH (ref 0.00–0.50)

## 2019-11-08 MED ORDER — IPRATROPIUM-ALBUTEROL 20-100 MCG/ACT IN AERS
1.0000 | INHALATION_SPRAY | Freq: Four times a day (QID) | RESPIRATORY_TRACT | Status: DC
  Administered 2019-11-08 – 2019-11-11 (×9): 1 via RESPIRATORY_TRACT
  Filled 2019-11-08 (×2): qty 4

## 2019-11-08 MED ORDER — ORAL CARE MOUTH RINSE
15.0000 mL | Freq: Two times a day (BID) | OROMUCOSAL | Status: DC
  Administered 2019-11-08 – 2019-11-10 (×3): 15 mL via OROMUCOSAL

## 2019-11-08 MED ORDER — METHYLPREDNISOLONE SODIUM SUCC 40 MG IJ SOLR
40.0000 mg | Freq: Two times a day (BID) | INTRAMUSCULAR | Status: DC
  Administered 2019-11-08 – 2019-11-11 (×6): 40 mg via INTRAVENOUS
  Filled 2019-11-08 (×6): qty 1

## 2019-11-08 MED ORDER — CHLORHEXIDINE GLUCONATE 0.12 % MT SOLN
15.0000 mL | Freq: Two times a day (BID) | OROMUCOSAL | Status: DC
  Administered 2019-11-08 – 2019-11-10 (×7): 15 mL via OROMUCOSAL
  Filled 2019-11-08 (×6): qty 15

## 2019-11-08 MED ORDER — ORAL CARE MOUTH RINSE
15.0000 mL | Freq: Two times a day (BID) | OROMUCOSAL | Status: DC
  Administered 2019-11-09 – 2019-11-10 (×4): 15 mL via OROMUCOSAL

## 2019-11-08 MED ORDER — MAGNESIUM SULFATE 4 GM/100ML IV SOLN
4.0000 g | Freq: Once | INTRAVENOUS | Status: AC
  Administered 2019-11-08: 4 g via INTRAVENOUS
  Filled 2019-11-08: qty 100

## 2019-11-08 MED ORDER — DOXYCYCLINE HYCLATE 100 MG PO TABS
100.0000 mg | ORAL_TABLET | Freq: Two times a day (BID) | ORAL | Status: DC
  Administered 2019-11-08 – 2019-11-11 (×7): 100 mg via ORAL
  Filled 2019-11-08 (×7): qty 1

## 2019-11-08 NOTE — Progress Notes (Signed)
RT made aware that covid positive pt's are to not get neb. Treatments unless through circuit. RT changed Duo neb treatment to Combivent inhaler.

## 2019-11-08 NOTE — Progress Notes (Signed)
eLink Physician-Brief Progress Note Patient Name: JAYLON GRODE DOB: 06-Oct-1945 MRN: [id number]   Date of Service  11/08/2019  HPI/Events of Note  Pt admitted with exacerbation of COPD, Covid pneumonia and CHF. On camera in to her room she is somnolent.  eICU Interventions  Appropriate for patient to remain NPO overnight.        Kerry Kass Alva Broxson 11/08/2019, 12:09 AM

## 2019-11-08 NOTE — Progress Notes (Signed)
NAME:  Tanya [name], MRN:  [id number], DOB:  Oct 08, 1945, LOS: 1 ADMISSION DATE:  11/07/2019, CONSULTATION DATE:  8/30 REFERRING MD:  Dr. Alvino Chapel, CHIEF COMPLAINT:  8/30   Brief History   74 year old female with multiple comorbid conditions admitted 8/30 with acute on chronic hypercarbic/hypoxemic respiratory failure with ddx COVID-19 pneumonia, CHF, COPD exacerbation. On BiPAP in ED.   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 She had a recent COVID exposure to her grand-daughters just over one week prior to admission. Then on approximately 8/23 she began to feel ill including some dyspnea. This progressed over the course of the week and she ultimately presented for COVID-19 vaccination, which resulted as positive 8/27. Dypsnea progressed causing her to present to [facility] ED on 8/30. Upon arrival her O2 sats were 60% and she was immediately started on supplemental oxygen. She became lethargic. ABG demonstrated hypercarbia and BiPAP was started. Imaging concerning for pulmonary edema vs COVID pneumonia. Considering she was COVID positive on BiPAP PCCM was called.   Past Medical History   has a past medical history of Asthma, Breast cancer in female [facility]), Diabetes ([place]), DVT (deep venous thrombosis) ([place]), Full dentures, GERD (gastroesophageal reflux disease), Gout, H/O total shoulder replacement, History of knee replacement, total, bilateral, History of septic arthritis, HTN (hypertension), Lymphedema of left arm, Morbid obesity ([place]), Neuropathy, OSA on CPAP, Ovarian cancer ([place]), PAF (paroxysmal atrial fibrillation) ([place]), Pulmonary nodules, S/P TAVR (transcatheter aortic valve replacement), Severe aortic stenosis, and Urine incontinence.   Significant Hospital Events   9/30  admit  Consults:    Procedures:    Significant Diagnostic Tests:    Micro Data:  Blood 8/30 >  Antimicrobials:  remdesivir 8/30 baricitinib 8/31->  Interim history/subjective:  tmax 100. On NIV overnight but on 9L HFNC this am. No acute distress. Did not receive vaccine and has "no intentions". Feels well and "would like to go home" advised she is on too much oxygen.  Objective   Blood pressure 131/73, pulse (!) 57, temperature 98.1 F (36.7 C), temperature source Oral, resp. rate (!) 21, weight 82.9 kg, SpO2 100 %.    Vent Mode: BIPAP;PCV FiO2 (%):  [50 %] 50 % Set Rate:  [8 bmp-10 bmp] 10 bmp PEEP:  [8 cmH20] 8 cmH20   Intake/Output Summary (Last 24 hours) at 11/08/2019 0825 Last data filed at 11/08/2019 0400 Gross per 24 hour  Intake 290 ml  Output 950 ml  Net -660 ml   Filed Weights   11/07/19 2336  Weight: 82.9 kg    Examination: General: Obese female in NAD reclining comfortably on hfnc HENT: [place]/AT, PERRL, no JVD Lungs: wheeze bilaterally Cardiovascular: RRR, no MRG Abdomen: Protuberant, soft, non-tender Extremities: No acute deformity.+ edema.  Neuro: Alert, oriented, non-focal  Resolved Hospital Problem list     Assessment & Plan:   Acute on chronic mixed respiratory failure: Multifactorial. She is positive for COVID-19 and likely has pneumonia related to this. It seems as though COPD exacerbation and decompensated OSA are playing a significant role in her presentation as well. Acute on chronic HFpEF also a likely contributor.  - transfer from ICU to floor -will have TRH assume care - Continue BiPAP overnight (uses CPAP at home with 2L bleed in at baseline) -  Accept O2 sats > 85% if she is breathing comfortably.  - Solu-medrol 40mg  BID, remdesivir, baricitinib.  - Incentive spirometry and proning when not on BiPAP - Trend inflammatory markers.   OSA on CPAP - NIMV QHS  Acute exacerbation of COPD - Steroids as above. Decreased to 40mg  bID -  Duoneb en lieu of home Breo  HFpEF +/- acute component Paroxysmal atrial fibrillation - Continue home Eliquis, amiodarone, metoprolol - Telemetry monitoring - Diuresis as indicated, holding scheduled home lasix.   DM Hyperglycemia - Levermir, SSI resistant, tradjenta  - Holding home metformin, januvia.   Lactic acidosis:improving - Follow BMP  Hypomag:  -replace  ** Based on ACTT-2, COV-BARRIER and other available trial data, baricitinib is being used under EUA by the FDA. The patient has no ESRD or AKI, known history of TB, severe neutropenia (ANC <500) or lymphopenia (ALC <200), or severe LFT elevations. They are not on DMARDs or probenecid and are not pregnant. The option to use/refuse baricitinib treatment under FDA authorization (not approval), the significant known and potential risks and benefits, the extent to which these are unknown, and information regarding all available alternatives were discussed in detail. Specifically, the risk of VTE and secondary infections were discussed in detail with the patient and HCPOA. They consent to proceed with treatment.   Best practice:  Diet: Carb modified heart healthy Pain/Anxiety/Delirium protocol (if indicated): NA VAP protocol (if indicated): NA DVT prophylaxis: eliquis GI prophylaxis: PPI Glucose control: Levemir, SSI Mobility: BR Code Status: FULL Family Communication: patient update, family pendingh.  Disposition: floor, pt stable for transfer from ICU. Will ask TRH to assume care and  CCM will sign off at that time.   Labs   CBC: Recent Labs  Lab 11/07/19 1850 11/07/19 1857 11/07/19 2118 11/08/19 0630  WBC 12.1*  --   --  5.0  NEUTROABS 6.7  --   --  4.2  HGB 11.9* 12.2 12.2 11.4*  HCT 39.4 36.0 36.0 34.6*  MCV 100.5*  --   --  95.6  PLT 321  --   --  371    Basic Metabolic Panel: Recent Labs  Lab 11/07/19 1850 11/07/19 1857 11/07/19 2118 11/08/19 0630  NA 133* 133* 135 137  K 5.3* 5.5* 4.6 4.6  CL 98   --   --  101  CO2 18*  --   --  22  GLUCOSE 378*  --   --  199*  BUN 13  --   --  14  CREATININE 0.89  --   --  0.74  CALCIUM 8.3*  --   --  8.4*  MG  --   --   --  1.2*  PHOS  --   --   --  3.9   GFR: Estimated Creatinine Clearance: 52.9 mL/min (by C-G formula based on SCr of 0.74 mg/dL). Recent Labs  Lab 11/07/19 1850 11/07/19 2040 11/08/19 0630  PROCALCITON <0.10  --   --   WBC 12.1*  --  5.0  LATICACIDVEN 4.3* 2.2*  --     Liver Function Tests: Recent Labs  Lab 11/07/19 1850 11/08/19 0630  AST 32 30  ALT 29 27  ALKPHOS 51 42  BILITOT 1.1 0.9  PROT 7.1 6.7  ALBUMIN 3.5 3.2*   No results for input(s): LIPASE, AMYLASE in the last 168 hours. No results for input(s): AMMONIA in the last 168 hours.  ABG    Component Value Date/Time   PHART 7.348 (L) 11/07/2019 2118  PCO2ART 47.5 11/07/2019 2118   PO2ART 118 (H) 11/07/2019 2118   HCO3 25.7 11/07/2019 2118   TCO2 27 11/07/2019 2118   ACIDBASEDEF 4.0 (H) 11/07/2019 1857   O2SAT 98.0 11/07/2019 2118     Coagulation Profile: No results for input(s): INR, PROTIME in the last 168 hours.  Cardiac Enzymes: No results for input(s): CKTOTAL, CKMB, CKMBINDEX, TROPONINI in the last 168 hours.  HbA1C: Hgb A1c MFr Bld  Date/Time Value Ref Range Status  09/03/2019 09:40 AM 7.4 (H) 4.8 - 5.6 % Final    Comment:    (NOTE) Pre diabetes:          5.7%-6.4%  Diabetes:              >6.4%  Glycemic control for   <7.0% adults with diabetes   06/23/2018 09:30 AM 8.5 (H) 4.8 - 5.6 % Final    Comment:    (NOTE) Pre diabetes:          5.7%-6.4% Diabetes:              >6.4% Glycemic control for   <7.0% adults with diabetes     CBG: Recent Labs  Lab 11/07/19 2258 11/07/19 2344 11/08/19 0341 11/08/19 0815  GLUCAP 165* 158* 204* 174*   Critical care time: The patient is critically ill with multiple organ systems failure and requires high complexity decision making for assessment and support, frequent evaluation  and titration of therapies, application of advanced monitoring technologies and extensive interpretation of multiple databases.  Critical care time 35 mins. This represents my time independent of the NPs time taking care of the pt. This is excluding procedures.    [facility] 11/08/2019, 8:25 AM

## 2019-11-08 NOTE — Progress Notes (Signed)
eLink Physician-Brief Progress Note Patient Name: Tanya [name] DOB: 11-May-1945 MRN: [id number]   Date of Service  11/08/2019  HPI/Events of Note  Urinary retention,  Bladder scan shows . 600 ml of urine in the bladder.  eICU Interventions  Order entered for foley catheterization.        Kerry Kass [name] 11/08/2019, 5:03 AM

## 2019-11-08 NOTE — Progress Notes (Signed)
Updated daughter via phone.. all questions answered to the best of my ability

## 2019-11-09 DIAGNOSIS — G4733 Obstructive sleep apnea (adult) (pediatric): Secondary | ICD-10-CM

## 2019-11-09 DIAGNOSIS — I10 Essential (primary) hypertension: Secondary | ICD-10-CM

## 2019-11-09 LAB — CBC WITH DIFFERENTIAL/PLATELET
Abs Immature Granulocytes: 0.05 10*3/uL (ref 0.00–0.07)
Basophils Absolute: 0 10*3/uL (ref 0.0–0.1)
Basophils Relative: 0 %
Eosinophils Absolute: 0 10*3/uL (ref 0.0–0.5)
Eosinophils Relative: 0 %
HCT: 36.3 % (ref 36.0–46.0)
Hemoglobin: 11.5 g/dL — ABNORMAL LOW (ref 12.0–15.0)
Immature Granulocytes: 1 %
Lymphocytes Relative: 11 %
Lymphs Abs: 1.1 10*3/uL (ref 0.7–4.0)
MCH: 30.4 pg (ref 26.0–34.0)
MCHC: 31.7 g/dL (ref 30.0–36.0)
MCV: 96 fL (ref 80.0–100.0)
Monocytes Absolute: 1 10*3/uL (ref 0.1–1.0)
Monocytes Relative: 11 %
Neutro Abs: 7.3 10*3/uL (ref 1.7–7.7)
Neutrophils Relative %: 77 %
Platelets: 326 10*3/uL (ref 150–400)
RBC: 3.78 MIL/uL — ABNORMAL LOW (ref 3.87–5.11)
RDW: 15.6 % — ABNORMAL HIGH (ref 11.5–15.5)
WBC: 9.4 10*3/uL (ref 4.0–10.5)
nRBC: 0 % (ref 0.0–0.2)

## 2019-11-09 LAB — GLUCOSE, CAPILLARY
Glucose-Capillary: 184 mg/dL — ABNORMAL HIGH (ref 70–99)
Glucose-Capillary: 119 mg/dL — ABNORMAL HIGH (ref 70–99)
Glucose-Capillary: 134 mg/dL — ABNORMAL HIGH (ref 70–99)
Glucose-Capillary: 177 mg/dL — ABNORMAL HIGH (ref 70–99)
Glucose-Capillary: 220 mg/dL — ABNORMAL HIGH (ref 70–99)
Glucose-Capillary: 115 mg/dL — ABNORMAL HIGH (ref 70–99)

## 2019-11-09 LAB — COMPREHENSIVE METABOLIC PANEL
ALT: 28 U/L (ref 0–44)
AST: 32 U/L (ref 15–41)
Albumin: 3.3 g/dL — ABNORMAL LOW (ref 3.5–5.0)
Alkaline Phosphatase: 39 U/L (ref 38–126)
Anion gap: 12 (ref 5–15)
BUN: 17 mg/dL (ref 8–23)
CO2: 24 mmol/L (ref 22–32)
Calcium: 8.7 mg/dL — ABNORMAL LOW (ref 8.9–10.3)
Chloride: 101 mmol/L (ref 98–111)
Creatinine, Ser: 0.83 mg/dL (ref 0.44–1.00)
GFR calc Af Amer: >60 mL/min (ref >60)
GFR calc non Af Amer: >60 mL/min (ref >60)
Glucose, Bld: 123 mg/dL — ABNORMAL HIGH (ref 70–99)
Potassium: 4.6 mmol/L (ref 3.5–5.1)
Sodium: 137 mmol/L (ref 135–145)
Total Bilirubin: 1 mg/dL (ref 0.3–1.2)
Total Protein: 6.9 g/dL (ref 6.5–8.1)

## 2019-11-09 LAB — MAGNESIUM: Magnesium: 1.8 mg/dL (ref 1.7–2.4)

## 2019-11-09 LAB — PHOSPHORUS: Phosphorus: 3.5 mg/dL (ref 2.5–4.6)

## 2019-11-09 LAB — C-REACTIVE PROTEIN: CRP: 3.3 mg/dL — ABNORMAL HIGH (ref <1.0)

## 2019-11-09 LAB — D-DIMER, QUANTITATIVE: D-Dimer, Quant: 0.66 ug/mL-FEU — ABNORMAL HIGH (ref 0.00–0.50)

## 2019-11-09 LAB — FERRITIN: Ferritin: 432 ng/mL — ABNORMAL HIGH (ref 11–307)

## 2019-11-09 MED ORDER — FUROSEMIDE 10 MG/ML IJ SOLN
40.0000 mg | Freq: Every day | INTRAMUSCULAR | Status: DC
  Administered 2019-11-09: 40 mg via INTRAVENOUS
  Filled 2019-11-09: qty 4

## 2019-11-09 MED ORDER — MAGNESIUM SULFATE 2 GM/50ML IV SOLN
2.0000 g | Freq: Once | INTRAVENOUS | Status: AC
  Administered 2019-11-09: 2 g via INTRAVENOUS
  Filled 2019-11-09: qty 50

## 2019-11-09 NOTE — Progress Notes (Signed)
PROGRESS NOTE                                                                                                                                                                                                             Patient Demographics:    Tanya [name], is a 74 y.o. female, DOB - 17-Jul-1945, BJS:[id number]  Outpatient Primary MD for the patient is Zara Chess, NP   Admit date - 11/07/2019   LOS - 2  Chief Complaint  Patient presents with  . Respiratory Distress       Brief Narrative: Patient is a 74 y.o. 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  She was started on BiPAP and subsequently admitted to the [facility] upon stabilization-transfer[facility] to the [facility].  COVID-19 vaccinated status: Unvaccinated  Significant Events: 8/30>> Admit to [facility] ICU for acute on chronic hypercarbic/hypoxic respiratory failure-COVID-19 positive-started on BiPAP 9/1>> transfer to [facility]  Significant studies: 8/30>>Chest x-ray: Widespread airspace opacities in both lungs  COVID-19 medications: Steroids: 8/30 >> Remdesivir: 8/30>> Baricitinib: 8/31>>  Antibiotics: None  Microbiology data: 8/30 >>blood culture: No growth  Procedures: None  Consults: None  DVT prophylaxis: SCDs Start: 11/07/19 2139 apixaban (ELIQUIS) tablet 5 mg     Subjective:    Tanya  [name] today feels better today-Down to 6 L of oxygen-inquiring about discharge.   Assessment  & Plan :   Acute on chronic hypercarbic and hypoxic respiratory failure: Felt to be multifactorial-from COPD exacerbation, decompensated OSA, decompensated diastolic heart failure with some contribution from COVID-19 pneumonia.  Initially required BiPAP-but improved-and now stable on 4-6 L of HFNC.  Remains on steroids, Remdesivir and baricitinib.  Continued attempts to slowly titrate down FiO2.  COVID-19 pneumonia: Hypoxia improved -with decreasing oxygen requirements-on steroids/remdesivir/baricitinib.  Fever: afebrile O2 requirements:  SpO2: 90 % O2 Flow Rate (L/min): 4 L/min FiO2 (%): 50 %   COVID-19 Labs: Recent Labs  11/07/19 1850 11/08/19 0630 11/09/19 0830  DDIMER 0.63* 0.92* 0.66*  FERRITIN 220 520* 432*  LDH 223*  --   --   CRP 7.3* 8.8* 3.3*       Component Value Date/Time   BNP 481.7 (H) 11/07/2019 1851    Recent Labs  Lab 11/07/19 1850  PROCALCITON <0.10    Lab Results  Component Value Date   SARSCOV2NAA POSITIVE (A) 11/07/2019   [place] NEGATIVE 09/03/2019   [place] NEGATIVE 06/22/2018     Prone/Incentive Spirometry: encouraged  incentive spirometry use 3-4/hour.  COPD exacerbation: Improved-no rhonchi-continue steroids and bronchodilators.  Acute on chronic diastolic heart failure: Volume status improved-continues to have some lower extremity edema-restart IV Lasix-reassess volume status tomorrow.  PAF: Stable-rate controlled with amiodarone-anticoagulated with Eliquis  HTN: BP stable-resume metoprolol when able  HLD: Continue statin  DM-2 (A1c 7.4 on 09/03/2019): CBG stable Levemir 6 units twice daily, SSI and Tradjenta.  Follow and adjust-plan to resume oral hypoglycemic agents on discharge   Recent Labs    11/09/19 0405 11/09/19 0821 11/09/19 1210  GLUCAP 184* 119* 134*    OSA with chronic hypoxic respiratory failure (2 L into  CPAP): CPAP nightly  Deconditioning/debility: PT/OT ordered-await further recommendations  Obesity: Estimated body mass index is 39.9 kg/m as calculated from the following:   Height as of 10/20/19: 4' 8.75" (1.441 m).   Weight as of this encounter: 82.9 kg.    ABG:    Component Value Date/Time   PHART 7.348 (L) 11/07/2019 2118   PCO2ART 47.5 11/07/2019 2118   PO2ART 118 (H) 11/07/2019 2118   HCO3 25.7 11/07/2019 2118   TCO2 27 11/07/2019 2118   ACIDBASEDEF 4.0 (H) 11/07/2019 1857   O2SAT 98.0 11/07/2019 2118    Vent Settings: N/A Vent Mode: BIPAP FiO2 (%):  [50 %] 50 % Set Rate:  [10 bmp] 10 bmp PEEP:  [8 cmH20] 8 cmH20  Condition - Guarded  Family Communication  :  Daughter [name] [phone]) updated over the phone on 9/1  Code Status :  Full Code  Diet :  Diet Order            Diet heart healthy/carb modified Room service appropriate? Yes; Fluid consistency: Thin  Diet effective now                  Disposition Plan  :   Status is: Inpatient  Remains inpatient appropriate because:Inpatient level of care appropriate due to severity of illness   Dispo: The patient is from: Home              Anticipated d/c is to: Home              Anticipated d/c date is: 2 days              Patient currently is not medically stable to d/c.  Barriers to discharge: Hypoxia requiring O2 supplementation/complete 5 days of IV Remdesivir  Antimicorbials  :    Anti-infectives (From admission, onward)   Start     Dose/Rate Route Frequency Ordered Stop   11/08/19 1100  doxycycline (VIBRA-TABS) tablet 100 mg        100 mg Oral Every 12 hours 11/08/19 0951     11/08/19 1000  remdesivir 100 mg in sodium chloride 0.9 % 100 mL IVPB       "Followed by" Linked Group Details   100 mg 200 mL/hr over 30 Minutes Intravenous Daily 11/07/19 2206 11/12/19 0959  11/07/19 2300  remdesivir 200 mg in sodium chloride 0.9% 250 mL IVPB       "Followed by" Linked Group Details   200 mg 580  mL/hr over 30 Minutes Intravenous Once 11/07/19 2206 11/08/19 0023      Inpatient Medications  Scheduled Meds: . amiodarone  200 mg Oral Daily  . apixaban  5 mg Oral BID  . vitamin C  500 mg Oral Daily  . atorvastatin  40 mg Oral Daily  . baricitinib  4 mg Oral Daily  . chlorhexidine  15 mL Mouth Rinse BID  . Chlorhexidine Gluconate Cloth  6 each Topical Q0600  . docusate sodium  100 mg Oral BID  . doxycycline  100 mg Oral Q12H  . DULoxetine  20 mg Oral Daily  . insulin aspart  0-20 Units Subcutaneous Q4H  . insulin detemir  6 Units Subcutaneous BID  . Ipratropium-Albuterol  1 puff Inhalation Q6H  . linagliptin  5 mg Oral Daily  . mouth rinse  15 mL Mouth Rinse q12n4p  . mouth rinse  15 mL Mouth Rinse BID  . methylPREDNISolone (SOLU-MEDROL) injection  40 mg Intravenous Q12H  . montelukast  10 mg Oral QHS  . pantoprazole (PROTONIX) IV  40 mg Intravenous QHS  . senna  1 tablet Oral BID  . zinc sulfate  220 mg Oral Daily   Continuous Infusions: . remdesivir 100 mg in NS 100 mL 100 mg (11/09/19 1002)   PRN Meds:.acetaminophen, chlorpheniramine-HYDROcodone, guaiFENesin-dextromethorphan, ondansetron **OR** ondansetron (ZOFRAN) IV   Time Spent in minutes  25  See all Orders from today for further details   Oren Binet M.D on 11/09/2019 at 2:46 PM  To page go to [email or web address] - use universal password  [facility] -  Office  [phone]    Objective:   Vitals:   11/09/19 0145 11/09/19 0444 11/09/19 1215 11/09/19 1440  BP:  118/62 137/63 134/79  Pulse: 84 62 74 75  Resp: (!) 22 19 (!) 28 (!) 21  Temp:  98.4 F (36.9 C)  98.6 F (37 C)  TempSrc:  Oral  Oral  SpO2: 93% 99% 90%   Weight:        Wt Readings from Last 3 Encounters:  11/07/19 82.9 kg  10/20/19 80.5 kg  09/23/19 81.4 kg     Intake/Output Summary (Last 24 hours) at 11/09/2019 1446 Last data filed at 11/09/2019 0150 Gross per 24 hour  Intake 270 ml  Output 950 ml  Net -680 ml      Physical Exam Gen Exam:Alert awake-not in any distress HEENT:atraumatic, normocephalic Chest: B/L clear to auscultation anteriorly CVS:S1S2 regular Abdomen:soft non tender, non distended Extremities:+ edema Neurology: Non focal Skin: no rash   Data Review:    CBC Recent Labs  Lab 11/07/19 1850 11/07/19 1857 11/07/19 2118 11/08/19 0630 11/09/19 0830  WBC 12.1*  --   --  5.0 9.4  HGB 11.9* 12.2 12.2 11.4* 11.5*  HCT 39.4 36.0 36.0 34.6* 36.3  PLT 321  --   --  226 326  MCV 100.5*  --   --  95.6 96.0  MCH 30.4  --   --  31.5 30.4  MCHC 30.2  --   --  32.9 31.7  RDW 15.7*  --   --  15.6* 15.6*  LYMPHSABS 3.9  --   --  0.5* 1.1  MONOABS 1.1*  --   --  0.2 1.0  EOSABS 0.1  --   --  0.0 0.0  BASOSABS 0.1  --   --  0.0 0.0    Chemistries  Recent Labs  Lab 11/07/19 1850 11/07/19 1857 11/07/19 2118 11/08/19 0630 11/09/19 0830  NA 133* 133* 135 137 137  K 5.3* 5.5* 4.6 4.6 4.6  CL 98  --   --  101 101  CO2 18*  --   --  22 24  GLUCOSE 378*  --   --  199* 123*  BUN 13  --   --  14 17  CREATININE 0.89  --   --  0.74 0.83  CALCIUM 8.3*  --   --  8.4* 8.7*  MG  --   --   --  1.2* 1.8  AST 32  --   --  30 32  ALT 29  --   --  27 28  ALKPHOS 51  --   --  42 39  BILITOT 1.1  --   --  0.9 1.0   ------------------------------------------------------------------------------------------------------------------ Recent Labs    11/07/19 1850  TRIG 90    Lab Results  Component Value Date   HGBA1C 7.4 (H) 09/03/2019   ------------------------------------------------------------------------------------------------------------------ No results for input(s): TSH, T4TOTAL, T3FREE, THYROIDAB in the last 72 hours.  Invalid input(s): FREET3 ------------------------------------------------------------------------------------------------------------------ Recent Labs    11/08/19 0630 11/09/19 0830  FERRITIN 520* 432*    Coagulation profile No results for input(s):  INR, PROTIME in the last 168 hours.  Recent Labs    11/08/19 0630 11/09/19 0830  DDIMER 0.92* 0.66*    Cardiac Enzymes No results for input(s): CKMB, TROPONINI, MYOGLOBIN in the last 168 hours.  Invalid input(s): CK ------------------------------------------------------------------------------------------------------------------    Component Value Date/Time   BNP 481.7 (H) 11/07/2019 1851    Micro Results Recent Results (from the past 240 hour(s))  Blood Culture (routine x 2)     Status: None (Preliminary result)   Collection Time: 11/07/19  6:50 PM   Specimen: BLOOD RIGHT HAND  Result Value Ref Range Status   Specimen Description BLOOD RIGHT HAND  Final   Special Requests   Final    BOTTLES DRAWN AEROBIC AND ANAEROBIC Blood Culture results may not be optimal due to an inadequate volume of blood received in culture bottles   Culture   Final    NO GROWTH 2 DAYS Performed at [facility], [place] [address], [place], [place] [address]    Report Status PENDING  Incomplete  Blood Culture (routine x 2)     Status: None (Preliminary result)   Collection Time: 11/07/19  6:55 PM   Specimen: BLOOD LEFT HAND  Result Value Ref Range Status   Specimen Description BLOOD LEFT HAND  Final   Special Requests   Final    BOTTLES DRAWN AEROBIC AND ANAEROBIC Blood Culture results may not be optimal due to an inadequate volume of blood received in culture bottles   Culture   Final    NO GROWTH 2 DAYS Performed at [facility], [place] [address] [place], [place] [address]    Report Status PENDING  Incomplete  SARS Coronavirus 2 by RT PCR (hospital order, performed in [facility] hospital lab) Nasopharyngeal Nasopharyngeal Swab     Status: Abnormal   Collection Time: 11/07/19  9:25 PM   Specimen: Nasopharyngeal Swab  Result Value Ref Range Status   SARS Coronavirus 2 POSITIVE (A) NEGATIVE Final    Comment: RESULT CALLED TO, READ BACK BY AND VERIFIED WITH: H,RICHARD @0006  11/08/19  EB (NOTE) SARS-CoV-2 target nucleic  acids are DETECTED  SARS-CoV-2 RNA is generally detectable in upper respiratory specimens  during the acute phase of infection.  Positive results are indicative  of the presence of the identified virus, but do not rule out bacterial infection or co-infection with other pathogens not detected by the test.  Clinical correlation with patient history and  other diagnostic information is necessary to determine patient infection status.  The expected result is negative.  Fact Sheet for Patients:   StrictlyIdeas.no   Fact Sheet for Healthcare Providers:   BankingDealers.co.za    This test is not yet approved or cleared by the [place] FDA and  has been authorized for detection and/or diagnosis of SARS-CoV-2 by FDA under an Emergency Use Authorization (EUA).  This EUA will remain in effect (meaning this test can b e used) for the duration of  the COVID-19 declaration under Section 564(b)(1) of the Act, 21 U.S.C. section 360-bbb-3(b)(1), unless the authorization is terminated or revoked sooner.  Performed at [facility], [place] [address] [place], [place] [address]   MRSA PCR Screening     Status: None   Collection Time: 11/07/19 11:38 PM   Specimen: Nasal Mucosa; Nasopharyngeal  Result Value Ref Range Status   MRSA by PCR NEGATIVE NEGATIVE Final    Comment:        The GeneXpert MRSA Assay (FDA approved for NASAL specimens only), is one component of a comprehensive MRSA colonization surveillance program. It is not intended to diagnose MRSA infection nor to guide or monitor treatment for MRSA infections. Performed at [facility], [place] [address], [place], [place] [address]     Radiology Reports DG Chest [place] 1 View  Result Date: 11/07/2019 CLINICAL DATA:  Sudden onset respiratory distress, COVID-19 EXAM: PORTABLE CHEST 1 VIEW COMPARISON:  Radiograph 06/29/2018, CT 07/22/2019  FINDINGS: Widespread heterogeneous airspace opacities are present in both lungs with a basilar predominance as well as cephalized indistinct pulmonary vascularity. Small left effusion. No right effusion. No visible pneumothorax. Cardiomegaly, similar to priors. Evidence of prior TAVR. The aorta is calcified. The remaining cardiomediastinal contours are unremarkable. No acute osseous or soft tissue abnormality. Prior reverse left shoulder arthroplasty. Telemetry leads overlie the chest. IMPRESSION: Widespread heterogeneous airspace opacities in both lungs with a basilar predominance as well as cephalized pulmonary vascularity, could reflect pulmonary edema and/or multifocal pneumonia in the setting of COVID-19. Aortic Atherosclerosis (ICD10-I70.0). Electronically Signed   By: Lovena Le M.D.   On: 11/07/2019 19:34

## 2019-11-09 NOTE — TOC Initial Note (Addendum)
Transition of Care [facility]) - Initial/Assessment Note    Patient Details  Name: Tanya [name] MRN: [id number] Date of Birth: [birth year]  [facility]) CM/SW Contact:    Carles Collet, RN Phone Number: 11/09/2019, 1:55 PM  Clinical Narrative:         Damaris Schooner w patient over the phone. She states that she lives at home w her daughter and son in law. She verified the address on file. She confirms that she has CPAP at home w oxygen bled in. It is through [employer]. Thedore Mins w [employer] checking in to see if she is an active client for home oxygen and what she has. Will need ambulatory sats and DME home oxygen at DC to be sent to Current DME provider to update order/ supply needs. Will need tank for transport brought by family or ordered at time of DC. Patient states that if she needs [facility] services she would like to use [facility], would need orders and referral made.  She has RWs, lift chair, and WC at home.  She states that she will have family transport back home at time of DC.          [employer] verified she is there client for home oxygen. Please obtain ambulatory sats and DME O2 order and provide to them at DC    Expected Discharge Plan: Home/Self Care Barriers to Discharge: Continued Medical Work up   Patient Goals and CMS Choice Patient states their goals for this hospitalization and ongoing recovery are:: to go home CMS Medicare.gov Compare Post Acute Care list provided to:: Patient Choice offered to / list presented to : Patient  Expected Discharge Plan and Services Expected Discharge Plan: Home/Self Care     Post Acute Care Choice: Durable Medical Equipment, Home Health                                        Prior Living Arrangements/Services   Lives with:: Adult Children                   Activities of Daily Living      Permission Sought/Granted                  Emotional Assessment              Admission diagnosis:  Acute respiratory failure with  hypercapnia ([place]) [J96.02] Pneumonia due to COVID-19 virus [U07.1, J12.82] COVID-19 [U07.1] Patient Active Problem List   Diagnosis Date Noted  . COVID-19 11/07/2019  . Need for vaccination 09/03/2019  . S/P TAVR (transcatheter aortic valve replacement)   . Pulmonary nodules   . Moderate pulmonary arterial systolic hypertension ([place]) 06/26/2018  . Acute on chronic respiratory failure with hypoxia ([place])   . Demand ischemia ([place])   . Moderate mitral stenosis   . Acute on chronic diastolic CHF (congestive heart failure) ([place])   . Urine incontinence   . Severe aortic stenosis   . History of septic arthritis   . PAF (paroxysmal atrial fibrillation) ([place])   . Ovarian cancer ([place])   . OSA on CPAP   . Neuropathy   . HTN (hypertension)   . History of knee replacement, total, bilateral   . H/O total shoulder replacement   . Gout   . GERD (gastroesophageal reflux disease)   . Full dentures   . Diabetes ([place])   . Breast  cancer in female [facility])   . Asthma   . Lymphedema of left arm   . Morbid obesity ([place])   . Acute respiratory failure with hypercapnia ([place]) 06/22/2018  . DKA (diabetic ketoacidoses) ([place]) 06/22/2018  . Elevated brain natriuretic peptide (BNP) level 06/22/2018  . Neutrophilic leukocytosis 37/48/2707   PCP:  Zara Chess, NP Pharmacy:   [place], [place] - [place] [address] [place] [address] Phone: [phone] Fax: [phone]     Social Determinants of Health (SDOH) Interventions    Readmission Risk Interventions No flowsheet data found.

## 2019-11-09 NOTE — Progress Notes (Signed)
RT placed patient on CPAP HS. 6L O2 bleed in needed. Patient tolerating well at this time.

## 2019-11-10 LAB — GLUCOSE, CAPILLARY
Glucose-Capillary: 151 mg/dL — ABNORMAL HIGH (ref 70–99)
Glucose-Capillary: 140 mg/dL — ABNORMAL HIGH (ref 70–99)
Glucose-Capillary: 85 mg/dL (ref 70–99)
Glucose-Capillary: 206 mg/dL — ABNORMAL HIGH (ref 70–99)
Glucose-Capillary: 232 mg/dL — ABNORMAL HIGH (ref 70–99)
Glucose-Capillary: 106 mg/dL — ABNORMAL HIGH (ref 70–99)

## 2019-11-10 MED ORDER — FUROSEMIDE 40 MG PO TABS
40.0000 mg | ORAL_TABLET | Freq: Every day | ORAL | Status: DC
  Administered 2019-11-10 – 2019-11-11 (×2): 40 mg via ORAL
  Filled 2019-11-10 (×2): qty 1

## 2019-11-10 MED ORDER — PANTOPRAZOLE SODIUM 40 MG PO TBEC
40.0000 mg | DELAYED_RELEASE_TABLET | Freq: Every day | ORAL | Status: DC
  Administered 2019-11-10: 40 mg via ORAL
  Filled 2019-11-10: qty 1

## 2019-11-10 NOTE — TOC Progression Note (Signed)
Transition of Care [facility]) - Progression Note    Patient Details  Name: Tanya [name] MRN: [id number] Date of Birth: [birth year]  Transition of Care [facility]) CM/SW Contact  Pollie Friar, RN Phone Number: 11/10/2019, 4:23 PM  Clinical Narrative:    [employer] and [employer] are the same company now. CM has updated Zack with [employer] about oxygen orders and sat qualifications.  TOC following.   Expected Discharge Plan: Home/Self Care Barriers to Discharge: Continued Medical Work up  Expected Discharge Plan and Services Expected Discharge Plan: Home/Self Care     Post Acute Care Choice: Durable Medical Equipment, Home Health                                         Social Determinants of Health (SDOH) Interventions    Readmission Risk Interventions No flowsheet data found.

## 2019-11-10 NOTE — Plan of Care (Signed)
°  Problem: Respiratory: Goal: Will maintain a patent airway Outcome: Progressing Goal: Complications related to the disease process, condition or treatment will be avoided or minimized Outcome: Progressing   Problem: Education: Goal: Knowledge of General Education information will improve Description: Including pain rating scale, medication(s)/side effects and non-pharmacologic comfort measures Outcome: Progressing   Problem: Clinical Measurements: Goal: Will remain free from infection Outcome: Progressing Goal: Respiratory complications will improve Outcome: Progressing

## 2019-11-10 NOTE — Progress Notes (Signed)
SATURATION QUALIFICATIONS: (This note is used to comply with regulatory documentation for home oxygen)  Patient Saturations on Room Air at Rest = 94%  Patient Saturations on Room Air while Ambulating = 85%  Patient Saturations on 2 Liters of oxygen while Ambulating = 96%  Please briefly explain why patient needs home oxygen:  Patient's oxygen dropped to 85% while ambulating on room air.

## 2019-11-10 NOTE — Evaluation (Signed)
Physical Therapy Evaluation Patient Details Name: Tanya [name] MRN: [id number] DOB: 05-28-1945 Today's Date: 11/10/2019   History of Present Illness  Patient is a 74 y.o. 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  She was started on BiPAP and subsequently admitted to the [facility] upon stabilization-[facility].  Clinical Impression  Pt is close to baseline functioning and should be safe at home with family's assist. There are no further acute PT needs.  Will sign off at this time.     Follow Up Recommendations No PT follow up    Equipment Recommendations  None recommended by PT    Recommendations for Other Services       Precautions / Restrictions Precautions Precautions: Fall      Mobility  Bed Mobility Overal bed mobility:  (sitting EOB on arribal)             General bed mobility comments: EOB on arrival  Transfers Overall transfer level: Modified independent                  Ambulation/Gait Ambulation/Gait assistance: Supervision Gait Distance (Feet): 160 Feet Assistive device: None Gait Pattern/deviations: Step-through pattern     General Gait Details: episodes of mild unsteadiness  Stairs            Wheelchair Mobility    Modified Rankin (Stroke Patients Only)       Balance Overall balance assessment: Mild deficits observed, not formally tested                                            Pertinent Vitals/Pain Pain Assessment: No/denies pain    Home Living Family/patient expects to be discharged to:: Private residence Living Arrangements: Children Available Help at Discharge: Family;Available 24 hours/day Type of Home: House Home Access: Stairs to enter Entrance Stairs-Rails: None Entrance Stairs-Number of Steps: 2 Home Layout: Two level Home Equipment: Walker - 2 wheels;Cane - single point;Bedside commode;Walker - 4 wheels;Other (comment);Transport chair;Tub bench Additional Comments: Pt lives with daughter and son in law     Prior Function Level of Independence: Independent with assistive device(s);Needs assistance   Gait / Transfers Assistance Needed: ambulates with cane per pt  ADL's / Homemaking Assistance Needed: assist with socks, but usually doesn't wear them        Hand Dominance   Dominant Hand: Right    Extremity/Trunk Assessment   Upper Extremity Assessment Upper Extremity Assessment: Overall WFL for tasks assessed    Lower Extremity Assessment Lower Extremity Assessment: Overall WFL for tasks assessed       Communication   Communication: No difficulties  Cognition Arousal/Alertness: Awake/alert Behavior During Therapy: WFL for tasks assessed/performed Overall Cognitive Status: Within Functional Limits for tasks assessed  General Comments General comments (skin integrity, edema, etc.): sats dropped into the 80's during gait with quick rebound to upper 90's once in standing rest.  Similar with 2 or 3L Tanya [name],  On RA at rest sats dropped to 91%.  Recommended to continue with 2L O2 during mobility and monitor over the week.    Exercises     Assessment/Plan    PT Assessment Patent does not need any further PT services  PT Problem List         PT Treatment Interventions      PT Goals (Current goals can be found in the Care  Plan section)  Acute Rehab PT Goals Patient Stated Goal: home tomorrow PT Goal Formulation: All assessment and education complete, DC therapy    Frequency     Barriers to discharge        Co-evaluation               AM-PAC PT "6 Clicks" Mobility  Outcome Measure Help needed turning from your back to your side while in a flat bed without using bedrails?: None Help needed moving from lying on your back to sitting on the side of a flat bed without using bedrails?: None Help needed moving to and from a bed to a chair (including a wheelchair)?: None Help needed standing up from a chair using your arms (e.g., wheelchair or bedside chair)?: None Help needed to walk in hospital room?: None Help needed climbing 3-5 steps with a railing? : A Little 6 Click Score: 23    End of Session   Activity Tolerance: Patient tolerated treatment well Patient left: in bed;with call bell/phone within reach Nurse Communication: Mobility status PT Visit Diagnosis: Other abnormalities of gait and mobility (R26.89)    Time: 8250-0370 PT Time Calculation (min) (ACUTE ONLY): 27 min   Charges:   PT Evaluation $PT Eval Moderate Complexity: 1 Mod PT Treatments $Gait Training: 8-22 mins        11/10/2019  Ginger Carne., PT Acute Rehabilitation Services [phone]  (pager) [phone]  (office)  Tessie Fass Melitza Metheny 11/10/2019, 7:23 PM

## 2019-11-10 NOTE — Plan of Care (Signed)
  Problem: Respiratory: Goal: Will maintain a patent airway Outcome: Progressing Goal: Complications related to the disease process, condition or treatment will be avoided or minimized Outcome: Progressing   Problem: Education: Goal: Knowledge of General Education information will improve Description: Including pain rating scale, medication(s)/side effects and non-pharmacologic comfort measures Outcome: Progressing   Problem: Clinical Measurements: Goal: Will remain free from infection Outcome: Progressing Goal: Respiratory complications will improve Outcome: Progressing

## 2019-11-10 NOTE — Progress Notes (Signed)
PROGRESS NOTE                                                                                                                                                                                                             Patient Demographics:    Tanya [name], is a 74 y.o. female, DOB - 02-29-1946, BOF:[id number]  Outpatient Primary MD for the patient is Zara Chess, NP   Admit date - 11/07/2019   LOS - 3  Chief Complaint  Patient presents with  . Respiratory Distress       Brief Narrative: Patient is a 74 y.o. 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  She was started on BiPAP and subsequently admitted to the [facility] upon stabilization-transferred to [facility].  COVID-19 vaccinated status: Unvaccinated  Significant Events: 8/30>> Admit to [facility] ICU for acute on chronic hypercarbic/hypoxic respiratory failure-COVID-19 positive-started on BiPAP 9/1>> transfer to [facility]  Significant studies: 8/30>>Chest x-ray: Widespread airspace opacities in both lungs  COVID-19 medications: Steroids: 8/30 >> Remdesivir: 8/30>> Baricitinib: 8/31>>  Antibiotics: None  Microbiology data: 8/30 >>blood culture: No growth  Procedures: None  Consults: None  DVT prophylaxis: SCDs Start: 11/07/19 2139 apixaban (ELIQUIS) tablet 5 mg     Subjective:   Feels  much better-titrated down to 2 L of oxygen this morning.  Inquiring about discharge.   Assessment  & Plan :   Acute on chronic hypercarbic and hypoxic respiratory failure: Felt to be multifactorial-from COPD exacerbation, decompensated OSA, decompensated diastolic heart failure with some contribution from COVID-19 pneumonia.  Initially required BiPAP-but improved-and now down to just 2 L of oxygen this morning.  Continue steroids/remdesivir and baricitinib.  Apparently is on 2 L of oxygen at home mostly nocturnally-suspect will require home O2 24/7 on discharge.    COVID-19 pneumonia: Clinical improvement continues-down to just 2 L of oxygen-remains on steroids, Remdesivir and burst unit.  Awaiting labs that  were ordered this morning.   Fever: afebrile O2 requirements:  SpO2: 95 % O2 Flow Rate (L/min): 4 L/min FiO2 (%): 50 %   COVID-19 Labs: Recent Labs    11/07/19 1850 11/08/19 0630 11/09/19 0830  DDIMER 0.63* 0.92* 0.66*  FERRITIN 220 520* 432*  LDH 223*  --   --   CRP 7.3* 8.8* 3.3*       Component Value Date/Time   BNP 481.7 (H) 11/07/2019 1851    Recent Labs  Lab 11/07/19 1850  PROCALCITON <0.10    Lab Results  Component Value Date   SARSCOV2NAA POSITIVE (A) 11/07/2019   [place] NEGATIVE 09/03/2019   [place] NEGATIVE 06/22/2018     Prone/Incentive Spirometry: encouraged  incentive spirometry use 3-4/hour.  COPD exacerbation: Improved-no rhonchi-continue steroids and bronchodilators.  Acute on chronic diastolic heart failure: Volume status improved-continues to have some lower extremity edema-restart IV Lasix-reassess volume status tomorrow.  PAF: Stable-rate controlled with amiodarone-anticoagulated with Eliquis  History of severe AS-s/p TAVR procedure 2020  HTN: BP stable-resume metoprolol when able  HLD: Continue statin  DM-2 (A1c 7.4 on 09/03/2019): CBG stable Levemir 6 units twice daily, SSI and Tradjenta.  Follow and adjust-plan to resume  oral hypoglycemic agents on discharge   Recent Labs    11/10/19 0316 11/10/19 0745 11/10/19 1154  GLUCAP 151* 140* 85    OSA with chronic hypoxic respiratory failure (2 L into CPAP): CPAP nightly  Deconditioning/debility: PT/OT ordered-await further recommendations  History of septic arthritis right shoulder/knee on chronic doxycycline therapy (after right shoulder replacement in 2017)  Obesity: Estimated body mass index is 39.27 kg/m as calculated from the following:   Height as of 10/20/19: 4' 8.75" (1.441 m).   Weight as of this encounter: 81.6 kg.    ABG:    Component Value Date/Time   PHART 7.348 (L) 11/07/2019 2118   PCO2ART 47.5 11/07/2019 2118   PO2ART 118 (H) 11/07/2019 2118   HCO3 25.7 11/07/2019 2118   TCO2 27 11/07/2019 2118   ACIDBASEDEF 4.0 (H) 11/07/2019 1857   O2SAT 98.0 11/07/2019 2118    Vent Settings: N/A    Condition - Guarded  Family Communication  :  Daughter [name] [phone]) updated over the phone on 9/2  Code Status :  Full Code  Diet :  Diet Order            Diet heart healthy/carb modified Room service appropriate? Yes; Fluid consistency: Thin  Diet effective now                  Disposition Plan  :   Status is: Inpatient  Remains inpatient appropriate because:Inpatient level of care appropriate due to severity of illness   Dispo: The patient is from: Home              Anticipated d/c is to: Home              Anticipated d/c date is: 2 days              Patient currently is not medically stable to d/c.  Barriers to discharge: Hypoxia requiring O2 supplementation/complete 5 days of IV Remdesivir  Antimicorbials  :    Anti-infectives (From admission, onward)   Start     Dose/Rate Route Frequency Ordered Stop   11/08/19 1100  doxycycline (VIBRA-TABS) tablet 100 mg        100 mg Oral Every 12 hours 11/08/19 0951     11/08/19 1000  remdesivir 100  mg in sodium chloride 0.9 % 100 mL IVPB       "Followed by" Linked Group  Details   100 mg 200 mL/hr over 30 Minutes Intravenous Daily 11/07/19 2206 11/12/19 0959   11/07/19 2300  remdesivir 200 mg in sodium chloride 0.9% 250 mL IVPB       "Followed by" Linked Group Details   200 mg 580 mL/hr over 30 Minutes Intravenous Once 11/07/19 2206 11/08/19 0023      Inpatient Medications  Scheduled Meds: . amiodarone  200 mg Oral Daily  . apixaban  5 mg Oral BID  . vitamin C  500 mg Oral Daily  . atorvastatin  40 mg Oral Daily  . baricitinib  4 mg Oral Daily  . chlorhexidine  15 mL Mouth Rinse BID  . Chlorhexidine Gluconate Cloth  6 each Topical Q0600  . docusate sodium  100 mg Oral BID  . doxycycline  100 mg Oral Q12H  . DULoxetine  20 mg Oral Daily  . insulin aspart  0-20 Units Subcutaneous Q4H  . insulin detemir  6 Units Subcutaneous BID  . Ipratropium-Albuterol  1 puff Inhalation Q6H  . linagliptin  5 mg Oral Daily  . mouth rinse  15 mL Mouth Rinse q12n4p  . mouth rinse  15 mL Mouth Rinse BID  . methylPREDNISolone (SOLU-MEDROL) injection  40 mg Intravenous Q12H  . montelukast  10 mg Oral QHS  . pantoprazole  40 mg Oral QHS  . senna  1 tablet Oral BID  . zinc sulfate  220 mg Oral Daily   Continuous Infusions: . remdesivir 100 mg in NS 100 mL 100 mg (11/10/19 1022)   PRN Meds:.acetaminophen, chlorpheniramine-HYDROcodone, guaiFENesin-dextromethorphan, ondansetron **OR** ondansetron (ZOFRAN) IV   Time Spent in minutes  25  See all Orders from today for further details   Oren Binet M.D on 11/10/2019 at 2:44 PM  To page go to [email or web address] - use universal password  [facility] -  Office  [phone]    Objective:   Vitals:   11/10/19 0318 11/10/19 0526 11/10/19 0826 11/10/19 1159  BP: 136/74  125/65 (!) 154/75  Pulse: 61 60 78 67  Resp: 19 20 16 16   Temp: 97.8 F (36.6 C)  97.6 F (36.4 C) 98.6 F (37 C)  TempSrc: Axillary  Axillary Oral  SpO2: 95% 96% 95% 95%  Weight:  81.6 kg      Wt Readings from Last 3 Encounters:    11/10/19 81.6 kg  10/20/19 80.5 kg  09/23/19 81.4 kg     Intake/Output Summary (Last 24 hours) at 11/10/2019 1444 Last data filed at 11/10/2019 1348 Gross per 24 hour  Intake 897 ml  Output 800 ml  Net 97 ml     Physical Exam Gen Exam:Alert awake-not in any distress HEENT:atraumatic, normocephalic Chest: B/L clear to auscultation anteriorly CVS:S1S2 regular Abdomen:soft non tender, non distended Extremities:+ edema Neurology: Non focal Skin: no rash   Data Review:    CBC Recent Labs  Lab 11/07/19 1850 11/07/19 1857 11/07/19 2118 11/08/19 0630 11/09/19 0830  WBC 12.1*  --   --  5.0 9.4  HGB 11.9* 12.2 12.2 11.4* 11.5*  HCT 39.4 36.0 36.0 34.6* 36.3  PLT 321  --   --  226 326  MCV 100.5*  --   --  95.6 96.0  MCH 30.4  --   --  31.5 30.4  MCHC 30.2  --   --  32.9 31.7  RDW 15.7*  --   --  15.6* 15.6*  LYMPHSABS 3.9  --   --  0.5* 1.1  MONOABS 1.1*  --   --  0.2 1.0  EOSABS 0.1  --   --  0.0 0.0  BASOSABS 0.1  --   --  0.0 0.0    Chemistries  Recent Labs  Lab 11/07/19 1850 11/07/19 1857 11/07/19 2118 11/08/19 0630 11/09/19 0830  NA 133* 133* 135 137 137  K 5.3* 5.5* 4.6 4.6 4.6  CL 98  --   --  101 101  CO2 18*  --   --  22 24  GLUCOSE 378*  --   --  199* 123*  BUN 13  --   --  14 17  CREATININE 0.89  --   --  0.74 0.83  CALCIUM 8.3*  --   --  8.4* 8.7*  MG  --   --   --  1.2* 1.8  AST 32  --   --  30 32  ALT 29  --   --  27 28  ALKPHOS 51  --   --  42 39  BILITOT 1.1  --   --  0.9 1.0   ------------------------------------------------------------------------------------------------------------------ Recent Labs    11/07/19 1850  TRIG 90    Lab Results  Component Value Date   HGBA1C 7.4 (H) 09/03/2019   ------------------------------------------------------------------------------------------------------------------ No results for input(s): TSH, T4TOTAL, T3FREE, THYROIDAB in the last 72 hours.  Invalid input(s):  FREET3 ------------------------------------------------------------------------------------------------------------------ Recent Labs    11/08/19 0630 11/09/19 0830  FERRITIN 520* 432*    Coagulation profile No results for input(s): INR, PROTIME in the last 168 hours.  Recent Labs    11/08/19 0630 11/09/19 0830  DDIMER 0.92* 0.66*    Cardiac Enzymes No results for input(s): CKMB, TROPONINI, MYOGLOBIN in the last 168 hours.  Invalid input(s): CK ------------------------------------------------------------------------------------------------------------------    Component Value Date/Time   BNP 481.7 (H) 11/07/2019 1851    Micro Results Recent Results (from the past 240 hour(s))  Blood Culture (routine x 2)     Status: None (Preliminary result)   Collection Time: 11/07/19  6:50 PM   Specimen: BLOOD RIGHT HAND  Result Value Ref Range Status   Specimen Description BLOOD RIGHT HAND  Final   Special Requests   Final    BOTTLES DRAWN AEROBIC AND ANAEROBIC Blood Culture results may not be optimal due to an inadequate volume of blood received in culture bottles   Culture   Final    NO GROWTH 3 DAYS Performed at [facility], [place] [address] [place], [place] [address]    Report Status PENDING  Incomplete  Blood Culture (routine x 2)     Status: None (Preliminary result)   Collection Time: 11/07/19  6:55 PM   Specimen: BLOOD LEFT HAND  Result Value Ref Range Status   Specimen Description BLOOD LEFT HAND  Final   Special Requests   Final    BOTTLES DRAWN AEROBIC AND ANAEROBIC Blood Culture results may not be optimal due to an inadequate volume of blood received in culture bottles   Culture   Final    NO GROWTH 3 DAYS Performed at [facility], [place] [address], [place], [place] [address]    Report Status PENDING  Incomplete  SARS Coronavirus 2 by RT PCR (hospital order, performed in [facility] hospital lab) Nasopharyngeal Nasopharyngeal Swab     Status: Abnormal    Collection Time: 11/07/19  9:25 PM   Specimen: Nasopharyngeal Swab  Result Value Ref Range Status   SARS Coronavirus 2 POSITIVE (A) NEGATIVE Final    Comment: RESULT CALLED TO, READ BACK BY AND VERIFIED WITH: H,RICHARD @0006  11/08/19 EB (NOTE) SARS-CoV-2 target nucleic acids are DETECTED  SARS-CoV-2 RNA is generally detectable in upper respiratory specimens  during the acute phase of infection.  Positive results are indicative  of the presence of the identified virus, but do not rule out bacterial infection or co-infection with other pathogens not detected by the test.  Clinical correlation with patient history and  other diagnostic information is necessary to determine patient infection status.  The expected result is negative.  Fact Sheet for Patients:   StrictlyIdeas.no   Fact Sheet for Healthcare Providers:   BankingDealers.co.za    This test is not yet approved or cleared by the [place] FDA and  has been authorized for detection and/or diagnosis of SARS-CoV-2 by FDA under an Emergency Use Authorization (EUA).  This EUA will remain in effect (meaning this test can b e used) for the duration of  the COVID-19 declaration under Section 564(b)(1) of the Act, 21 U.S.C. section 360-bbb-3(b)(1), unless the authorization is terminated or revoked sooner.  Performed at [facility], [place] [address] [place], [place] [address]   MRSA PCR Screening     Status: None   Collection Time: 11/07/19 11:38 PM   Specimen: Nasal Mucosa; Nasopharyngeal  Result Value Ref Range Status   MRSA by PCR NEGATIVE NEGATIVE Final    Comment:        The GeneXpert MRSA Assay (FDA approved for NASAL specimens only), is one component of a comprehensive MRSA colonization surveillance program. It is not intended to diagnose MRSA infection nor to guide or monitor treatment for MRSA infections. Performed at [facility], [place] [address] [place], [place] [address]     Radiology Reports DG Chest [place] 1 View  Result Date: 11/07/2019 CLINICAL DATA:  Sudden onset respiratory distress, COVID-19 EXAM: PORTABLE CHEST 1 VIEW COMPARISON:  Radiograph 06/29/2018, CT 07/22/2019 FINDINGS: Widespread heterogeneous airspace opacities are present in both lungs with a basilar predominance as well as cephalized indistinct pulmonary vascularity. Small left effusion. No right effusion. No visible pneumothorax. Cardiomegaly, similar to priors. Evidence of prior TAVR. The aorta is calcified. The remaining cardiomediastinal contours are unremarkable. No acute osseous or soft tissue abnormality. Prior reverse left shoulder arthroplasty. Telemetry leads overlie the chest. IMPRESSION: Widespread heterogeneous airspace opacities in both lungs with a basilar predominance as well as cephalized pulmonary vascularity, could reflect pulmonary edema and/or multifocal pneumonia in the setting of COVID-19. Aortic Atherosclerosis (ICD10-I70.0). Electronically Signed   By: Lovena Le M.D.   On: 11/07/2019 19:34

## 2019-11-11 DIAGNOSIS — E1159 Type 2 diabetes mellitus with other circulatory complications: Secondary | ICD-10-CM

## 2019-11-11 LAB — COMPREHENSIVE METABOLIC PANEL
ALT: 28 U/L (ref 0–44)
AST: 26 U/L (ref 15–41)
Albumin: 3.2 g/dL — ABNORMAL LOW (ref 3.5–5.0)
Alkaline Phosphatase: 36 U/L — ABNORMAL LOW (ref 38–126)
Anion gap: 13 (ref 5–15)
BUN: 26 mg/dL — ABNORMAL HIGH (ref 8–23)
CO2: 27 mmol/L (ref 22–32)
Calcium: 9.2 mg/dL (ref 8.9–10.3)
Chloride: 98 mmol/L (ref 98–111)
Creatinine, Ser: 1.02 mg/dL — ABNORMAL HIGH (ref 0.44–1.00)
GFR calc Af Amer: >60 mL/min (ref >60)
GFR calc non Af Amer: 53 mL/min — ABNORMAL LOW (ref >60)
Glucose, Bld: 132 mg/dL — ABNORMAL HIGH (ref 70–99)
Potassium: 4.1 mmol/L (ref 3.5–5.1)
Sodium: 138 mmol/L (ref 135–145)
Total Bilirubin: 1.1 mg/dL (ref 0.3–1.2)
Total Protein: 6.6 g/dL (ref 6.5–8.1)

## 2019-11-11 LAB — CBC WITH DIFFERENTIAL/PLATELET
Abs Immature Granulocytes: 0.08 10*3/uL — ABNORMAL HIGH (ref 0.00–0.07)
Basophils Absolute: 0 10*3/uL (ref 0.0–0.1)
Basophils Relative: 0 %
Eosinophils Absolute: 0 10*3/uL (ref 0.0–0.5)
Eosinophils Relative: 0 %
HCT: 32.7 % — ABNORMAL LOW (ref 36.0–46.0)
Hemoglobin: 11 g/dL — ABNORMAL LOW (ref 12.0–15.0)
Immature Granulocytes: 0 %
Lymphocytes Relative: 10 %
Lymphs Abs: 0.7 10*3/uL (ref 0.7–4.0)
MCH: 31.3 pg (ref 26.0–34.0)
MCHC: 33.6 g/dL (ref 30.0–36.0)
MCV: 92.9 fL (ref 80.0–100.0)
Monocytes Absolute: 0.4 10*3/uL (ref 0.1–1.0)
Monocytes Relative: 6 %
Neutro Abs: 5.9 10*3/uL (ref 1.7–7.7)
Neutrophils Relative %: 83 %
Platelets: 348 10*3/uL (ref 150–400)
RBC: 3.52 MIL/uL — ABNORMAL LOW (ref 3.87–5.11)
RDW: 15.3 % (ref 11.5–15.5)
WBC: 7.1 10*3/uL (ref 4.0–10.5)
nRBC: 0 % (ref 0.0–0.2)

## 2019-11-11 LAB — MAGNESIUM: Magnesium: 1.4 mg/dL — ABNORMAL LOW (ref 1.7–2.4)

## 2019-11-11 LAB — FERRITIN: Ferritin: 305 ng/mL (ref 11–307)

## 2019-11-11 LAB — GLUCOSE, CAPILLARY
Glucose-Capillary: 153 mg/dL — ABNORMAL HIGH (ref 70–99)
Glucose-Capillary: 140 mg/dL — ABNORMAL HIGH (ref 70–99)
Glucose-Capillary: 107 mg/dL — ABNORMAL HIGH (ref 70–99)

## 2019-11-11 LAB — C-REACTIVE PROTEIN: CRP: 0.6 mg/dL (ref <1.0)

## 2019-11-11 LAB — D-DIMER, QUANTITATIVE: D-Dimer, Quant: 0.3 ug/mL-FEU (ref 0.00–0.50)

## 2019-11-11 MED ORDER — PREDNISONE 10 MG PO TABS: ORAL_TABLET | ORAL | 0 refills | Status: AC

## 2019-11-11 NOTE — Discharge Summary (Signed)
PATIENT DETAILS Name: Tanya [name] Age: 74 y.o. Sex: female Date of Birth: August 10, 1945 MRN: [id number]. Admitting Physician: Collier Bullock, MD Tanya [name], Tanya Lunger, NP  Admit Date: 11/07/2019 Discharge date: 11/11/2019  Recommendations for Outpatient Follow-up:  1. Follow up with PCP in 1-2 weeks 2. Please obtain CMP/CBC in one week 3. Repeat Chest Xray in 4-6 week 4. Please reassess with the patient still requires home O2 at next visit.   Admitted From:  Home  Disposition: [place]: No  Equipment/Devices: Oxygen 2L  Discharge Condition: Stable/  CODE STATUS: FULL CODE  Diet recommendation:  Diet Order            Diet - low sodium heart healthy           Diet Carb Modified           Diet heart healthy/carb modified Room service appropriate? Yes; Fluid consistency: Thin  Diet effective now                 Brief Narrative: Patient is a 74 y.o. 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  She was started on BiPAP and subsequently admitted to the [facility] upon stabilization-transferred to the [facility] hospitalist service.  COVID-19 vaccinated status: Unvaccinated  Significant Events: 8/30>> Admit to [facility] ICU for acute on chronic hypercarbic/hypoxic respiratory failure-COVID-19 positive-started on BiPAP 9/1>> transfer to [facility]  Significant studies: 8/30>>Chest x-ray: Widespread airspace opacities in  both lungs  COVID-19 medications: Steroids: 8/30 >> Remdesivir: 8/30>>9/3 Baricitinib: 8/31>>9/3  Antibiotics: None  Microbiology data: 8/30 >>blood culture: No growth  Procedures: None  Consults: None  Brief Hospital Course: Acute on chronic hypercarbic and hypoxic respiratory failure: Felt to be multifactorial-from COPD exacerbation, decompensated OSA, decompensated diastolic heart failure with some contribution from COVID-19 pneumonia.  Initially required BiPAP-but improved-and now down to just 2 L of oxygen this morning.    She was treated with steroids/remdesivir/baricitinib and diuresis.  She feels much better-and is requesting discharge.  She normally is on 2 L of oxygen at night-and uses oxygen as needed during the daytime.  For now-she has been asked to use 2 L of oxygen 24/7 until seen by her primary care practitioner.    COVID-19 pneumonia: Clinical improvement continues-down to just 2 L of oxygen-will be on tapering steroids on discharge.  Complete Remdesivir on 9/3.  COVID-19 Labs:  Recent Labs    11/09/19 0830 11/11/19 0156  DDIMER 0.66* 0.30  FERRITIN 432* 305  CRP 3.3* 0.6    Lab Results  Component Value Date   SARSCOV2NAA POSITIVE (A) 11/07/2019   [place] NEGATIVE 09/03/2019   [place] NEGATIVE 06/22/2018     COPD exacerbation: Improved-no rhonchi-continue steroids and bronchodilators.  Acute on chronic diastolic heart failure: Volume status improved-continues to have some lower extremity edema-on IV Lasix which has been transitioned to oral Lasix.  Continue usual regimen.  PAF: Stable-rate controlled with amiodarone-anticoagulated with Eliquis  History of severe AS-s/p TAVR procedure 2020  HTN: BP stable-resume metoprolol   HLD: Continue statin  DM-2 (A1c 7.4 on 09/03/2019): CBG  stable on Levemir and SSI-plan is to resume oral hypoglycemic agents on discharge.    OSA with chronic hypoxic respiratory failure (2 L into CPAP):  CPAP nightly  Deconditioning/debility: PT eval ordered-does not require home health services.  History of septic arthritis right shoulder/knee on chronic doxycycline therapy (after right shoulder replacement in 2017)  Obesity: Estimated body mass index is 39.27 kg/m as calculated from the following:   Height as of 10/20/19: 4' 8.75" (1.441 m).   Weight as of this encounter: 81.6 kg.    Discharge Diagnoses:  Active Problems:   Acute respiratory failure with hypercapnia [facility])   COVID-19   Discharge Instructions:    Person Under Monitoring Name: Tanya [name]  Location: [address] [place] [address]   Infection Prevention Recommendations for Individuals Confirmed to have, or Being Evaluated for, 2019 Novel Coronavirus (COVID-19) Infection Who Receive Care at Home  Individuals who are confirmed to have, or are being evaluated for, COVID-19 should follow the prevention steps below until a healthcare provider or local or state health department says they can return to normal activities.  Stay home except to get medical care You should restrict activities outside your home, except for getting medical care. Do not go to work, school, or public areas, and do not use public transportation or taxis.  Call ahead before visiting your doctor Before your medical appointment, call the healthcare provider and tell them that you have, or are being evaluated for, COVID-19 infection. This will help the healthcare provider's office take steps to keep other people from getting infected. Ask your healthcare provider to call the local or state health department.  Monitor your symptoms Seek prompt medical attention if your illness is worsening (e.g., difficulty breathing). Before going to your medical appointment, call the healthcare provider and tell them that you have, or are being evaluated for, COVID-19 infection. Ask your healthcare provider to call the local or state health  department.  Wear a facemask You should wear a facemask that covers your nose and mouth when you are in the same room with other people and when you visit a healthcare provider. People who live with or visit you should also wear a facemask while they are in the same room with you.  Separate yourself from other people in your home As much as possible, you should stay in a different room from other people in your home. Also, you should use a separate bathroom, if available.  Avoid sharing household items You should not share dishes, drinking glasses, cups, eating utensils, towels, bedding, or other items with other people in your home. After using these items, you should wash them thoroughly with soap and water.  Cover your coughs and sneezes Cover your mouth and nose with a tissue when you cough or sneeze, or you can cough or sneeze into your sleeve. Throw used tissues in a lined trash can, and immediately wash your hands with soap and water for at least 20 seconds or use an alcohol-based hand rub.  Wash your [facility] your hands often and thoroughly with soap and water for at least 20 seconds. You can use an alcohol-based hand sanitizer if soap and water are not available and if your hands are not visibly dirty. Avoid touching your eyes, nose, and mouth with unwashed hands.   Prevention Steps for Caregivers and Household Members of Individuals Confirmed to have, or Being Evaluated for, COVID-19 Infection Being Cared for in the Home  If you live with,  or provide care at home for, a person confirmed to have, or being evaluated for, COVID-19 infection please follow these guidelines to prevent infection:  Follow healthcare provider's instructions Make sure that you understand and can help the patient follow any healthcare provider instructions for all care.  Provide for the patient's basic needs You should help the patient with basic needs in the home and provide support for getting  groceries, prescriptions, and other personal needs.  Monitor the patient's symptoms If they are getting sicker, call his or her medical provider and tell them that the patient has, or is being evaluated for, COVID-19 infection. This will help the healthcare provider's office take steps to keep other people from getting infected. Ask the healthcare provider to call the local or state health department.  Limit the number of people who have contact with the patient  If possible, have only one caregiver for the patient.  Other household members should stay in another home or place of residence. If this is not possible, they should stay  in another room, or be separated from the patient as much as possible. Use a separate bathroom, if available.  Restrict visitors who do not have an essential need to be in the home.  Keep older adults, very young children, and other sick people away from the patient Keep older adults, very young children, and those who have compromised immune systems or chronic health conditions away from the patient. This includes people with chronic heart, lung, or kidney conditions, diabetes, and cancer.  Ensure good ventilation Make sure that shared spaces in the home have good air flow, such as from an air conditioner or an opened window, weather permitting.  Wash your hands often  Wash your hands often and thoroughly with soap and water for at least 20 seconds. You can use an alcohol based hand sanitizer if soap and water are not available and if your hands are not visibly dirty.  Avoid touching your eyes, nose, and mouth with unwashed hands.  Use disposable paper towels to dry your hands. If not available, use dedicated cloth towels and replace them when they become wet.  Wear a facemask and gloves  Wear a disposable facemask at all times in the room and gloves when you touch or have contact with the patient's blood, body fluids, and/or secretions or excretions,  such as sweat, saliva, sputum, nasal mucus, vomit, urine, or feces.  Ensure the mask fits over your nose and mouth tightly, and do not touch it during use.  Throw out disposable facemasks and gloves after using them. Do not reuse.  Wash your hands immediately after removing your facemask and gloves.  If your personal clothing becomes contaminated, carefully remove clothing and launder. Wash your hands after handling contaminated clothing.  Place all used disposable facemasks, gloves, and other waste in a lined container before disposing them with other household waste.  Remove gloves and wash your hands immediately after handling these items.  Do not share dishes, glasses, or other household items with the patient  Avoid sharing household items. You should not share dishes, drinking glasses, cups, eating utensils, towels, bedding, or other items with a patient who is confirmed to have, or being evaluated for, COVID-19 infection.  After the person uses these items, you should wash them thoroughly with soap and water.  Wash laundry thoroughly  Immediately remove and wash clothes or bedding that have blood, body fluids, and/or secretions or excretions, such as sweat, saliva, sputum, nasal mucus,  vomit, urine, or feces, on them.  Wear gloves when handling laundry from the patient.  Read and follow directions on labels of laundry or clothing items and detergent. In general, wash and dry with the warmest temperatures recommended on the label.  Clean all areas the individual has used often  Clean all touchable surfaces, such as counters, tabletops, doorknobs, bathroom fixtures, toilets, phones, keyboards, tablets, and bedside tables, every day. Also, clean any surfaces that may have blood, body fluids, and/or secretions or excretions on them.  Wear gloves when cleaning surfaces the patient has come in contact with.  Use a diluted bleach solution (e.g., dilute bleach with 1 part bleach and 10  parts water) or a household disinfectant with a label that says EPA-registered for coronaviruses. To make a bleach solution at home, add 1 tablespoon of bleach to 1 quart (4 cups) of water. For a larger supply, add  cup of bleach to 1 gallon (16 cups) of water.  Read labels of cleaning products and follow recommendations provided on product labels. Labels contain instructions for safe and effective use of the cleaning product including precautions you should take when applying the product, such as wearing gloves or eye protection and making sure you have good ventilation during use of the product.  Remove gloves and wash hands immediately after cleaning.  Monitor yourself for signs and symptoms of illness Caregivers and household members are considered close contacts, should monitor their health, and will be asked to limit movement outside of the home to the extent possible. Follow the monitoring steps for close contacts listed on the symptom monitoring form.   ? If you have additional questions, contact your local health department or call the epidemiologist on call at [phone] (available 24/7). ? This guidance is subject to change. For the most up-to-date guidance from CDC, please refer to their website: YouBlogs.pl    Activity:  As tolerated with Full fall precautions use walker/cane & assistance as needed  Discharge Instructions    (HEART FAILURE PATIENTS) Call MD:  Anytime you have any of the following symptoms: 1) 3 pound weight gain in 24 hours or 5 pounds in 1 week 2) shortness of breath, with or without a dry hacking cough 3) swelling in the hands, feet or stomach 4) if you have to sleep on extra pillows at night in order to breathe.   Complete by: As directed    Diet - low sodium heart healthy   Complete by: As directed    Diet Carb Modified   Complete by: As directed    Discharge instructions   Complete by: As  directed    Follow with Primary MD  Zara Chess, NP in 1-2 weeks  Please ask your primary care practitioner to reassess whether you still require oxygen 24/7.  3 weeks of isolation from 10/28/2019  It is highly recommended that after after a month or so-if your symptoms are improved-to consider getting the COVID-19 vaccine.  Please seek immediate medical attention if your shortness of breath worsens.  Please get a complete blood count and chemistry panel checked by your Primary MD at your next visit, and again as instructed by your Primary MD.  Get Medicines reviewed and adjusted: Please take all your medications with you for your next visit with your Primary MD  Laboratory/radiological data: Please request your Primary MD to go over all hospital tests and procedure/radiological results at the follow up, please ask your Primary MD to get all Hospital records sent to  his/her office.  In some cases, they will be blood work, cultures and biopsy results pending at the time of your discharge. Please request that your primary care M.D. follows up on these results.  Also Note the following: If you experience worsening of your admission symptoms, develop shortness of breath, life threatening emergency, suicidal or homicidal thoughts you must seek medical attention immediately by calling 911 or calling your MD immediately  if symptoms less severe.  You must read complete instructions/literature along with all the possible adverse reactions/side effects for all the Medicines you take and that have been prescribed to you. Take any new Medicines after you have completely understood and accpet all the possible adverse reactions/side effects.   Do not drive when taking Pain medications or sleeping medications (Benzodaizepines)  Do not take more than prescribed Pain, Sleep and Anxiety Medications. It is not advisable to combine anxiety,sleep and pain medications without talking with your primary care  practitioner  Special Instructions: If you have smoked or chewed Tobacco  in the last 2 yrs please stop smoking, stop any regular Alcohol  and or any Recreational drug use.  Wear Seat belts while driving.  Please note: You were cared for by a hospitalist during your hospital stay. Once you are discharged, your primary care physician will handle any further medical issues. Please note that NO REFILLS for any discharge medications will be authorized once you are discharged, as it is imperative that you return to your primary care physician (or establish a relationship with a primary care physician if you do not have one) for your post hospital discharge needs so that they can reassess your need for medications and monitor your lab values.   Increase activity slowly   Complete by: As directed    No dressing needed   Complete by: As directed      Allergies as of 11/11/2019      Reactions   Fentanyl Anaphylaxis   Pneumovax [pneumococcal Polysaccharide Vaccine] Swelling, Other (See Comments)   Arm swelling   Pneumococcal Vaccine Swelling, Other (See Comments)   Arm swelling   Sulfa Antibiotics Other (See Comments)   UNSPECIFIED CHILDHOOD REACTION   Tape Other (See Comments)   Latex-containing Band-Aids PEEL OFF THE SKIN!!   Latex Rash   Levaquin [levofloxacin] Rash      Medication List    TAKE these medications   acetaminophen 325 MG tablet Commonly known as: TYLENOL Take 650 mg by mouth every 6 (six) hours as needed for mild pain or fever.   amiodarone 200 MG tablet Commonly known as: PACERONE Take 1 tablet (200 mg total) by mouth daily.   atorvastatin 40 MG tablet Commonly known as: LIPITOR Take 1 tablet (40 mg total) by mouth daily.   azelastine 0.1 % nasal spray Commonly known as: ASTELIN Place 1 spray into both nostrils 2 (two) times daily as needed for rhinitis or allergies.   Breo Ellipta 200-25 MCG/INH Aepb Generic drug: fluticasone furoate-vilanterol Inhale 1 puff  into the lungs daily.   calcium carbonate 1250 (500 Ca) MG tablet Commonly known as: OS-CAL - dosed in mg of elemental calcium Take 1 tablet by mouth 2 (two) times daily with a meal.   doxycycline 100 MG tablet Commonly known as: VIBRA-TABS Take 100 mg by mouth 2 (two) times daily.   DULoxetine 30 MG capsule Commonly known as: CYMBALTA Take 30 mg by mouth 2 (two) times daily.   Eliquis 5 MG Tabs tablet Generic drug: apixaban Take 5 mg  by mouth 2 (two) times daily.   esomeprazole 40 MG capsule Commonly known as: NEXIUM Take 40 mg by mouth daily before breakfast.   fexofenadine 180 MG tablet Commonly known as: ALLEGRA Take 180 mg by mouth daily.   furosemide 40 MG tablet Commonly known as: Lasix Take 1 tablet (40 mg total) by mouth daily. What changed: when to take this   HYDROcodone-homatropine 5-1.5 MG/5ML syrup Commonly known as: HYCODAN Take 5 mLs by mouth every 12 (twelve) hours as needed for cough.   Magnesium 400 MG Tabs Take 1 tablet 400 mg by mouth once daily   metFORMIN 500 MG tablet Commonly known as: GLUCOPHAGE Take 1,000 mg by mouth 2 (two) times daily with a meal.   metoprolol succinate 25 MG 24 hr tablet Commonly known as: Toprol XL Take 1 tablet (25 mg total) by mouth daily. Take with or immediately following a meal. What changed: when to take this   montelukast 10 MG tablet Commonly known as: SINGULAIR Take 10 mg by mouth at bedtime.   multivitamin with minerals Tabs tablet Take 1 tablet by mouth daily.   potassium chloride 10 MEQ tablet Commonly known as: KLOR-CON Take 1 tablet (10 mEq total) by mouth daily.   predniSONE 10 MG tablet Commonly known as: DELTASONE Take 40 mg daily for 1 day, 30 mg daily for 1 day, 20 mg daily for 1 days,10 mg daily for 1 day, then stop   sitaGLIPtin 50 MG tablet Commonly known as: JANUVIA Take 50 mg by mouth daily.   Ventolin HFA 108 (90 Base) MCG/ACT inhaler Generic drug: albuterol Inhale 2 puffs into  the lungs every 4 (four) hours as needed for wheezing or shortness of breath.   albuterol (2.5 MG/3ML) 0.083% nebulizer solution Commonly known as: PROVENTIL Take 2.5 mg by nebulization every 4 (four) hours as needed for wheezing or shortness of breath.   vitamin E 45 MG (100 UNITS) capsule Take 100 Units by mouth daily.            Durable Medical Equipment  (From admission, onward)         Start     Ordered   11/10/19 1444  For home use only DME oxygen  Once       Question Answer Comment  Length of Need 6 Months   Mode or (Route) Nasal cannula   Liters per Minute 2   Frequency Continuous (stationary and portable oxygen unit needed)   Oxygen conserving device Yes   Oxygen delivery system Gas      11/10/19 1444           Discharge Care Instructions  (From admission, onward)         Start     Ordered   11/11/19 0000  No dressing needed        11/11/19 1112          Follow-up Information    Zara Chess, NP. Schedule an appointment as soon as possible for a visit in 1 week(s).   Specialty: Nurse Practitioner Contact information: [address] [place] [address] [phone]        Croitoru, Dani Gobble, MD. Schedule an appointment as soon as possible for a visit in 1 month(s).   Specialty: Cardiology Contact information: [address] [place] [address] [phone]              Allergies  Allergen Reactions  . Fentanyl Anaphylaxis  . Pneumovax [Pneumococcal Polysaccharide Vaccine]  Swelling and Other (See Comments)    Arm swelling  . Pneumococcal Vaccine Swelling and Other (See Comments)    Arm swelling  . Sulfa Antibiotics Other (See Comments)    UNSPECIFIED CHILDHOOD REACTION  . Tape Other (See Comments)    Latex-containing Band-Aids PEEL OFF THE SKIN!!  . Latex Rash  . Levaquin [Levofloxacin] Rash     Other Procedures/Studies: DG Chest Port 1 View  Result Date: 11/07/2019 CLINICAL  DATA:  Sudden onset respiratory distress, COVID-19 EXAM: PORTABLE CHEST 1 VIEW COMPARISON:  Radiograph 06/29/2018, CT 07/22/2019 FINDINGS: Widespread heterogeneous airspace opacities are present in both lungs with a basilar predominance as well as cephalized indistinct pulmonary vascularity. Small left effusion. No right effusion. No visible pneumothorax. Cardiomegaly, similar to priors. Evidence of prior TAVR. The aorta is calcified. The remaining cardiomediastinal contours are unremarkable. No acute osseous or soft tissue abnormality. Prior reverse left shoulder arthroplasty. Telemetry leads overlie the chest. IMPRESSION: Widespread heterogeneous airspace opacities in both lungs with a basilar predominance as well as cephalized pulmonary vascularity, could reflect pulmonary edema and/or multifocal pneumonia in the setting of COVID-19. Aortic Atherosclerosis (ICD10-I70.0). Electronically Signed   By: Lovena Le M.D.   On: 11/07/2019 19:34     TODAY-DAY OF DISCHARGE:  Subjective:   Tanya [name] today has no headache,no chest abdominal pain,no new weakness tingling or numbness, feels much better wants to go home today.   Objective:   Blood pressure (!) 137/58, pulse 63, temperature 98.2 F (36.8 C), temperature source Oral, resp. rate (!) 21, weight 80.7 kg, SpO2 92 %.  Intake/Output Summary (Last 24 hours) at 11/11/2019 1113 Last data filed at 11/11/2019 0411 Gross per 24 hour  Intake 537 ml  Output 600 ml  Net -63 ml   Filed Weights   11/07/19 2336 11/10/19 0526 11/11/19 0500  Weight: 82.9 kg 81.6 kg 80.7 kg    Exam: Awake Alert, Oriented *3, No new F.N deficits, Normal affect [place].AT,PERRAL Supple Neck,No JVD, No cervical lymphadenopathy appriciated.  Symmetrical Chest wall movement, Good air movement bilaterally, CTAB RRR,No Gallops,Rubs or new Murmurs, No Parasternal Heave +ve B.Sounds, Abd Soft, Non tender, No organomegaly appriciated, No rebound -guarding or rigidity. No Cyanosis,  Clubbing or edema, No new Rash or bruise   PERTINENT RADIOLOGIC STUDIES: DG Chest Port 1 View  Result Date: 11/07/2019 CLINICAL DATA:  Sudden onset respiratory distress, COVID-19 EXAM: PORTABLE CHEST 1 VIEW COMPARISON:  Radiograph 06/29/2018, CT 07/22/2019 FINDINGS: Widespread heterogeneous airspace opacities are present in both lungs with a basilar predominance as well as cephalized indistinct pulmonary vascularity. Small left effusion. No right effusion. No visible pneumothorax. Cardiomegaly, similar to priors. Evidence of prior TAVR. The aorta is calcified. The remaining cardiomediastinal contours are unremarkable. No acute osseous or soft tissue abnormality. Prior reverse left shoulder arthroplasty. Telemetry leads overlie the chest. IMPRESSION: Widespread heterogeneous airspace opacities in both lungs with a basilar predominance as well as cephalized pulmonary vascularity, could reflect pulmonary edema and/or multifocal pneumonia in the setting of COVID-19. Aortic Atherosclerosis (ICD10-I70.0). Electronically Signed   By: Lovena Le M.D.   On: 11/07/2019 19:34     PERTINENT LAB RESULTS: CBC: Recent Labs    11/09/19 0830 11/11/19 0156  WBC 9.4 7.1  HGB 11.5* 11.0*  HCT 36.3 32.7*  PLT 326 348   CMET CMP     Component Value Date/Time   NA 138 11/11/2019 0156   NA 138 09/07/2019 1225   K 4.1 11/11/2019 0156   CL 98 11/11/2019 0156  CO2 27 11/11/2019 0156   GLUCOSE 132 (H) 11/11/2019 0156   BUN 26 (H) 11/11/2019 0156   BUN 24 09/07/2019 1225   CREATININE 1.02 (H) 11/11/2019 0156   CALCIUM 9.2 11/11/2019 0156   PROT 6.6 11/11/2019 0156   ALBUMIN 3.2 (L) 11/11/2019 0156   AST 26 11/11/2019 0156   ALT 28 11/11/2019 0156   ALKPHOS 36 (L) 11/11/2019 0156   BILITOT 1.1 11/11/2019 0156   GFRNONAA 53 (L) 11/11/2019 0156   GFRAA >60 11/11/2019 0156    GFR Estimated Creatinine Clearance: 40.8 mL/min (A) (by C-G formula based on SCr of 1.02 mg/dL (H)). No results for input(s):  LIPASE, AMYLASE in the last 72 hours. No results for input(s): CKTOTAL, CKMB, CKMBINDEX, TROPONINI in the last 72 hours. Invalid input(s): POCBNP Recent Labs    11/09/19 0830 11/11/19 0156  DDIMER 0.66* 0.30   No results for input(s): HGBA1C in the last 72 hours. No results for input(s): CHOL, HDL, LDLCALC, TRIG, CHOLHDL, LDLDIRECT in the last 72 hours. No results for input(s): TSH, T4TOTAL, T3FREE, THYROIDAB in the last 72 hours.  Invalid input(s): FREET3 Recent Labs    11/09/19 0830 11/11/19 0156  FERRITIN 432* 305   Coags: No results for input(s): INR in the last 72 hours.  Invalid input(s): PT Microbiology: Recent Results (from the past 240 hour(s))  Blood Culture (routine x 2)     Status: None (Preliminary result)   Collection Time: 11/07/19  6:50 PM   Specimen: BLOOD RIGHT HAND  Result Value Ref Range Status   Specimen Description BLOOD RIGHT HAND  Final   Special Requests   Final    BOTTLES DRAWN AEROBIC AND ANAEROBIC Blood Culture results may not be optimal due to an inadequate volume of blood received in culture bottles   Culture   Final    NO GROWTH 4 DAYS Performed at [facility], [place] [address] [place], [place] [address]    Report Status PENDING  Incomplete  Blood Culture (routine x 2)     Status: None (Preliminary result)   Collection Time: 11/07/19  6:55 PM   Specimen: BLOOD LEFT HAND  Result Value Ref Range Status   Specimen Description BLOOD LEFT HAND  Final   Special Requests   Final    BOTTLES DRAWN AEROBIC AND ANAEROBIC Blood Culture results may not be optimal due to an inadequate volume of blood received in culture bottles   Culture   Final    NO GROWTH 4 DAYS Performed at [facility], [place] [address], [place], [place] [address]    Report Status PENDING  Incomplete  SARS Coronavirus 2 by RT PCR (hospital order, performed in [facility] hospital lab) Nasopharyngeal Nasopharyngeal Swab     Status: Abnormal   Collection Time: 11/07/19   9:25 PM   Specimen: Nasopharyngeal Swab  Result Value Ref Range Status   SARS Coronavirus 2 POSITIVE (A) NEGATIVE Final    Comment: RESULT CALLED TO, READ BACK BY AND VERIFIED WITH: H,RICHARD @0006  11/08/19 EB (NOTE) SARS-CoV-2 target nucleic acids are DETECTED  SARS-CoV-2 RNA is generally detectable in upper respiratory specimens  during the acute phase of infection.  Positive results are indicative  of the presence of the identified virus, but do not rule out bacterial infection or co-infection with other pathogens not detected by the test.  Clinical correlation with patient history and  other diagnostic information is necessary to determine patient infection status.  The expected result is negative.  Fact Sheet for Patients:   StrictlyIdeas.no   Fact Sheet for Healthcare Providers:   BankingDealers.co.za    This test is not yet approved or cleared by the [place] FDA and  has been authorized for detection and/or diagnosis of SARS-CoV-2 by FDA under an Emergency Use Authorization (EUA).  This EUA will remain in effect (meaning this test can b e used) for the duration of  the COVID-19 declaration under Section 564(b)(1) of the Act, 21 U.S.C. section 360-bbb-3(b)(1), unless the authorization is terminated or revoked sooner.  Performed at [facility], [place] [address], [place], [place] [address]   MRSA PCR Screening     Status: None   Collection Time: 11/07/19 11:38 PM   Specimen: Nasal Mucosa; Nasopharyngeal  Result Value Ref Range Status   MRSA by PCR NEGATIVE NEGATIVE Final    Comment:        The GeneXpert MRSA Assay (FDA approved for NASAL specimens only), is one component of a comprehensive MRSA colonization surveillance program. It is not intended to diagnose MRSA infection nor to guide or monitor treatment for MRSA infections. Performed at [facility], [place] [address], [place], [place] [address]      FURTHER DISCHARGE INSTRUCTIONS:  Get Medicines reviewed and adjusted: Please take all your medications with you for your next visit with your Primary MD  Laboratory/radiological data: Please request your Primary MD to go over all hospital tests and procedure/radiological results at the follow up, please ask your Primary MD to get all Hospital records sent to his/her office.  In some cases, they will be blood work, cultures and biopsy results pending at the time of your discharge. Please request that your primary care M.D. goes through all the records of your hospital data and follows up on these results.  Also Note the following: If you experience worsening of your admission symptoms, develop shortness of breath, life threatening emergency, suicidal or homicidal thoughts you must seek medical attention immediately by calling 911 or calling your MD immediately  if symptoms less severe.  You must read complete instructions/literature along with all the possible adverse reactions/side effects for all the Medicines you take and that have been prescribed to you. Take any new Medicines after you have completely understood and accpet all the possible adverse reactions/side effects.   Do not drive when taking Pain medications or sleeping medications (Benzodaizepines)  Do not take more than prescribed Pain, Sleep and Anxiety Medications. It is not advisable to combine anxiety,sleep and pain medications without talking with your primary care practitioner  Special Instructions: If you have smoked or chewed Tobacco  in the last 2 yrs please stop smoking, stop any regular Alcohol  and or any Recreational drug use.  Wear Seat belts while driving.  Please note: You were cared for by a hospitalist during your hospital stay. Once you are discharged, your primary care physician will handle any further medical issues. Please note that NO REFILLS for any discharge medications will be authorized once you  are discharged, as it is imperative that you return to your primary care physician (or establish a relationship with a primary care physician if you do not have one) for your post hospital discharge needs so that they can reassess your need for medications and monitor your lab values.  Total Time spent coordinating discharge including counseling, education and face to face time equals 35 minutes.  Signed: Kyrian Stage 11/11/2019 11:13 AM

## 2019-11-11 NOTE — Evaluation (Signed)
Occupational Therapy Evaluation Patient Details Name: Tanya [name] MRN: [id number] DOB: 16-Mar-1945 Today's Date: 11/11/2019    History of Present Illness 74 yo female presented to the ED on 8/30 with worsening shortness of breath and lethargy; found to have acute hypoxic/hypercarbic respiratory failure secondary to COPD exacerbation, decompensated OSA, decompensated diastolic heart failure, and COVID-19 pneumonia. Tested COVID-19+. 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   Clinical Impression   PTA, pt was living with her daughter and was performing BADLs and using SPC for functional mobility. Pt currently performing ADLs and functional mobility at [place] I level. Demonstrating decreased activity tolerance and requiring increased time and rest breaks. Provided education and handout on [facility] for ADLs and IADLs; pt verbalized understanding.  Pt would benefit from further acute OT to facilitate safe dc. Recommend dc to home once medically stable per physician. All acute OT needs met and answered questions in preparation for dc later today. Will sign off.    SpO2 >86% on 3-4L during functional mobility.    Follow Up Recommendations  No OT follow up    Equipment Recommendations  None recommended by OT    Recommendations for Other Services PT consult     Precautions / Restrictions Precautions Precautions: Fall      Mobility Bed Mobility Overal bed mobility: Modified Independent             General bed mobility comments: Increased time  Transfers Overall transfer level: Modified independent Equipment used: Straight cane                  Balance Overall balance assessment: Mild deficits observed, not formally tested                                          ADL either performed or assessed with clinical judgement   ADL Overall ADL's : Needs assistance/impaired                                       General ADL Comments: Pt performing ADLs and functional mobility at Supervision level; with exception of donning socks which pt does not perform at home. Pt with coughing fit while supine and SpO2 dropping to 84% on 2L. Pt requiring 3-4L to maintain SpO2 >86% during activity. Use of SPC with mobility. Providing education and handout on [facility] for ADLs and IADLs; pt verbalized understanding.     Vision Baseline Vision/History: Wears glasses Wears Glasses: Reading only Patient Visual Report: No change from baseline       Perception     Praxis      Pertinent Vitals/Pain Pain Assessment: No/denies pain     Hand Dominance Right   Extremity/Trunk Assessment Upper Extremity Assessment Upper Extremity Assessment: Overall WFL for tasks assessed   Lower Extremity Assessment Lower Extremity Assessment: Overall WFL for tasks assessed   Cervical / Trunk Assessment Cervical / Trunk Assessment: Normal   Communication Communication Communication: No difficulties   Cognition Arousal/Alertness: Awake/alert Behavior During Therapy: WFL for tasks assessed/performed Overall Cognitive Status: Within Functional Limits for tasks assessed  General Comments  SpO2 >86% on 3-4L during functional mobility.    Exercises     Shoulder Instructions      Home Living Family/patient expects to be discharged to:: Private residence Living Arrangements: Children Available Help at Discharge: Family;Available 24 hours/day Type of Home: House Home Access: Stairs to enter [employer] of Steps: 2 Entrance Stairs-Rails: None Home Layout: Two level Alternate Level Stairs-Number of Steps: stair lift   Bathroom Shower/Tub: Radiographer, therapeutic: Standard Bathroom Accessibility: Yes   Home Equipment: Environmental consultant - 2 wheels;Cane - single point;Bedside commode;Walker - 4 wheels;Other (comment);Transport chair;Tub bench   Additional Comments: Pt lives with daughter and son in law       Prior Functioning/Environment Level of Independence: Independent with assistive device(s);Needs assistance  Gait / Transfers Assistance Needed: ambulates with cane per pt ADL's / Homemaking Assistance Needed: assist with socks, but usually doesn't wear them   Comments: needs occasional assist getting into tub         OT Problem List: Decreased activity tolerance;Impaired balance (sitting and/or standing);Decreased knowledge of use of DME or AE      OT Treatment/Interventions:      OT Goals(Current goals can be found in the care plan section) Acute Rehab OT Goals Patient Stated Goal: Go home today OT Goal Formulation: All assessment and education complete, DC therapy  OT Frequency:     Barriers to D/C:            Co-evaluation              AM-PAC OT "6 Clicks" Daily Activity     Outcome Measure Help from another person eating meals?: None Help from another person taking care of personal grooming?: None Help from another person toileting, which includes using toliet, bedpan, or urinal?: A Little Help from another person bathing (including washing, rinsing, drying)?: A Little Help from another person to put on and taking off regular upper body clothing?: None Help from another person to put on and taking off regular lower body clothing?: A Little 6 Click Score: 21   End of Session Equipment Utilized During Treatment: Oxygen;Other (comment) [facility]) Nurse Communication: Mobility status  Activity Tolerance: Patient tolerated treatment well Patient left: in bed;with call bell/phone within reach  OT Visit Diagnosis: Unsteadiness on feet (R26.81);Other abnormalities of gait and mobility (R26.89);Muscle weakness (generalized)  (M62.81)                Time: 1130-1156 OT Time Calculation (min): 26 min Charges:  OT General Charges $OT Visit: 1 Visit OT Evaluation $OT Eval Low Complexity: 1 Low OT Treatments $Self Care/Home Management : 8-22 mins  Christasia Angeletti MSOT, OTR/L Acute Rehab Pager: [phone] Office: [place] 11/11/2019, 1:27 PM

## 2019-11-11 NOTE — TOC Transition Note (Signed)
Transition of Care [facility]) - CM/SW Discharge Note   Patient Details  Name: KHANIYAH BEZEK MRN: [id number] Date of Birth: 1945/05/01  Transition of Care [facility]) CM/SW Contact:  Bartholomew Crews, RN Phone Number:  [phone] 11/11/2019, 11:44 AM   Clinical Narrative:     Spoke with patient on the room phone. She has chronic O2 at home. States that her daughter is bringing her portable tank for discharge and will provide transportation home. No home health needs or other DME needs identified. Patient verbalized that she will follow up with PCP and cardiologist as outpatient. No further TOC needs identified.   Final next level of care: Home/Self Care Barriers to Discharge: No Barriers Identified   Patient Goals and CMS Choice Patient states their goals for this hospitalization and ongoing recovery are:: return home CMS Medicare.gov Compare Post Acute Care list provided to:: Patient Choice offered to / list presented to : NA  Discharge Placement                       Discharge Plan and Services     Post Acute Care Choice: Durable Medical Equipment, Home Health          DME Arranged: N/A DME Agency: NA       HH Arranged: NA HH Agency: NA        Social Determinants of Health (SDOH) Interventions     Readmission Risk Interventions No flowsheet data found.

## 2019-11-11 NOTE — Care Management Important Message (Signed)
Important Message  Patient Details  Name: Tanya [name] MRN: [id number] Date of Birth: 1945/03/29   Medicare Important Message Given:  Yes - Important Message mailed due to current National Emergency  Verbal consent obtained due to current National Emergency  Relationship to patient: Self Contact Name: Remmy Crass Call Date: 11/11/19  Time: 1159 Phone: [phone] Outcome: Spoke with contact Important Message mailed to: Patient address on file    Delorse Lek 11/11/2019, 12:00 PM

## 2019-11-11 NOTE — Plan of Care (Signed)
  Problem: Coping: Goal: Psychosocial and spiritual needs will be supported Outcome: Progressing   Problem: Respiratory: Goal: Will maintain a patent airway Outcome: Progressing   Problem: Education: Goal: Knowledge of General Education information will improve Description: Including pain rating scale, medication(s)/side effects and non-pharmacologic comfort measures Outcome: Progressing   Problem: Clinical Measurements: Goal: Will remain free from infection Outcome: Progressing Goal: Diagnostic test results will improve Outcome: Progressing

## 2019-11-11 NOTE — Discharge Instructions (Addendum)
Person Under Monitoring Name: Tanya [name]  Location: [address] [place] [address]   Infection Prevention Recommendations for Individuals Confirmed to have, or Being Evaluated for, 2019 Novel Coronavirus (COVID-19) Infection Who Receive Care at Home  Individuals who are confirmed to have, or are being evaluated for, COVID-19 should follow the prevention steps below until a healthcare provider or local or state health department says they can return to normal activities.  Stay home except to get medical care You should restrict activities outside your home, except for getting medical care. Do not go to work, school, or public areas, and do not use public transportation or taxis.  Call ahead before visiting your doctor Before your medical appointment, call the healthcare provider and tell them that you have, or are being evaluated for, COVID-19 infection. This will help the healthcare provider's office take steps to keep other people from getting infected. Ask your healthcare provider to call the local or state health department.  Monitor your symptoms Seek prompt medical attention if your illness is worsening (e.g., difficulty breathing). Before going to your medical appointment, call the healthcare provider and tell them that you have, or are being evaluated for, COVID-19 infection. Ask your healthcare provider to call the local or state health department.  Wear a facemask You should wear a facemask that covers your nose and mouth when you are in the same room with other people and when you visit a healthcare provider. People who live with or visit you should also wear a facemask while they are in the same room with you.  Separate yourself from other people in your home As much as possible, you should stay in a different room from other people in your home. Also, you should use a separate bathroom, if available.  Avoid sharing household items You should not  share dishes, drinking glasses, cups, eating utensils, towels, bedding, or other items with other people in your home. After using these items, you should wash them thoroughly with soap and water.  Cover your coughs and sneezes Cover your mouth and nose with a tissue when you cough or sneeze, or you can cough or sneeze into your sleeve. Throw used tissues in a lined trash can, and immediately wash your hands with soap and water for at least 20 seconds or use an alcohol-based hand rub.  Wash your [facility] your hands often and thoroughly with soap and water for at least 20 seconds. You can use an alcohol-based hand sanitizer if soap and water are not available and if your hands are not visibly dirty. Avoid touching your eyes, nose, and mouth with unwashed hands.   Prevention Steps for Caregivers and Household Members of Individuals Confirmed to have, or Being Evaluated for, COVID-19 Infection Being Cared for in the Home  If you live with, or provide care at home for, a person confirmed to have, or being evaluated for, COVID-19 infection please follow these guidelines to prevent infection:  Follow healthcare provider's instructions Make sure that you understand and can help the patient follow any healthcare provider instructions for all care.  Provide for the patient's basic needs You should help the patient with basic needs in the home and provide support for getting groceries, prescriptions, and other personal needs.  Monitor the patient's symptoms If they are getting sicker, call his or her medical provider and tell them that the patient has, or is being evaluated for, COVID-19 infection. This will help the healthcare provider's office  take steps to keep other people from getting infected. Ask the healthcare provider to call the local or state health department.  Limit the number of people who have contact with the patient  If possible, have only one caregiver for the  patient.  Other household members should stay in another home or place of residence. If this is not possible, they should stay  in another room, or be separated from the patient as much as possible. Use a separate bathroom, if available.  Restrict visitors who do not have an essential need to be in the home.  Keep older adults, very young children, and other sick people away from the patient Keep older adults, very young children, and those who have compromised immune systems or chronic health conditions away from the patient. This includes people with chronic heart, lung, or kidney conditions, diabetes, and cancer.  Ensure good ventilation Make sure that shared spaces in the home have good air flow, such as from an air conditioner or an opened window, weather permitting.  Wash your hands often  Wash your hands often and thoroughly with soap and water for at least 20 seconds. You can use an alcohol based hand sanitizer if soap and water are not available and if your hands are not visibly dirty.  Avoid touching your eyes, nose, and mouth with unwashed hands.  Use disposable paper towels to dry your hands. If not available, use dedicated cloth towels and replace them when they become wet.  Wear a facemask and gloves  Wear a disposable facemask at all times in the room and gloves when you touch or have contact with the patient's blood, body fluids, and/or secretions or excretions, such as sweat, saliva, sputum, nasal mucus, vomit, urine, or feces.  Ensure the mask fits over your nose and mouth tightly, and do not touch it during use.  Throw out disposable facemasks and gloves after using them. Do not reuse.  Wash your hands immediately after removing your facemask and gloves.  If your personal clothing becomes contaminated, carefully remove clothing and launder. Wash your hands after handling contaminated clothing.  Place all used disposable facemasks, gloves, and other waste in a lined  container before disposing them with other household waste.  Remove gloves and wash your hands immediately after handling these items.  Do not share dishes, glasses, or other household items with the patient  Avoid sharing household items. You should not share dishes, drinking glasses, cups, eating utensils, towels, bedding, or other items with a patient who is confirmed to have, or being evaluated for, COVID-19 infection.  After the person uses these items, you should wash them thoroughly with soap and water.  Wash laundry thoroughly  Immediately remove and wash clothes or bedding that have blood, body fluids, and/or secretions or excretions, such as sweat, saliva, sputum, nasal mucus, vomit, urine, or feces, on them.  Wear gloves when handling laundry from the patient.  Read and follow directions on labels of laundry or clothing items and detergent. In general, wash and dry with the warmest temperatures recommended on the label.  Clean all areas the individual has used often  Clean all touchable surfaces, such as counters, tabletops, doorknobs, bathroom fixtures, toilets, phones, keyboards, tablets, and bedside tables, every day. Also, clean any surfaces that may have blood, body fluids, and/or secretions or excretions on them.  Wear gloves when cleaning surfaces the patient has come in contact with.  Use a diluted bleach solution (e.g., dilute bleach with 1 part  bleach and 10 parts water) or a household disinfectant with a label that says EPA-registered for coronaviruses. To make a bleach solution at home, add 1 tablespoon of bleach to 1 quart (4 cups) of water. For a larger supply, add  cup of bleach to 1 gallon (16 cups) of water.  Read labels of cleaning products and follow recommendations provided on product labels. Labels contain instructions for safe and effective use of the cleaning product including precautions you should take when applying the product, such as wearing gloves or  eye protection and making sure you have good ventilation during use of the product.  Remove gloves and wash hands immediately after cleaning.  Monitor yourself for signs and symptoms of illness Caregivers and household members are considered close contacts, should monitor their health, and will be asked to limit movement outside of the home to the extent possible. Follow the monitoring steps for close contacts listed on the symptom monitoring form.   ? If you have additional questions, contact your local health department or call the epidemiologist on call at [phone] (available 24/7). ? This guidance is subject to change. For the most up-to-date guidance from [facility], please refer to their website: [email or web address]   =================================================================================================  Information on my medicine - ELIQUIS (apixaban)  Why was Eliquis prescribed for you? Eliquis was prescribed for you to reduce the risk of a blood clot forming that can cause a stroke if you have a medical condition called atrial fibrillation (a type of irregular heartbeat).  What do You need to know about Eliquis ? Take your Eliquis TWICE DAILY - one tablet in the morning and one tablet in the evening with or without food. If you have difficulty swallowing the tablet whole please discuss with your pharmacist how to take the medication safely.  Take Eliquis exactly as prescribed by your doctor and DO NOT stop taking Eliquis without talking to the doctor who prescribed the medication.  Stopping may increase your risk of developing a stroke.  Refill your prescription before you run out.  After discharge, you should have regular check-up appointments with your healthcare provider that is prescribing your Eliquis.  In the future your dose may need to be changed if your kidney function or weight changes by a  significant amount or as you get older.  What do you do if you miss a dose? If you miss a dose, take it as soon as you remember on the same day and resume taking twice daily.  Do not take more than one dose of ELIQUIS at the same time to make up a missed dose.  Important Safety Information A possible side effect of Eliquis is bleeding. You should call your healthcare provider right away if you experience any of the following: ? Bleeding from an injury or your nose that does not stop. ? Unusual colored urine (red or dark brown) or unusual colored stools (red or black). ? Unusual bruising for unknown reasons. ? A serious fall or if you hit your head (even if there is no bleeding).  Some medicines may interact with Eliquis and might increase your risk of bleeding or clotting while on Eliquis. To help avoid this, consult your healthcare provider or pharmacist prior to using any new prescription or non-prescription medications, including herbals, vitamins, non-steroidal anti-inflammatory drugs (NSAIDs) and supplements.  This website has more information on Eliquis (apixaban): http://www.eliquis.com/eliquis/home

## 2019-11-12 LAB — CULTURE, BLOOD (ROUTINE X 2)
Culture: NO GROWTH
Culture: NO GROWTH

## 2019-11-16 ENCOUNTER — Other Ambulatory Visit: Payer: Self-pay

## 2019-11-16 ENCOUNTER — Emergency Department (HOSPITAL_COMMUNITY): Payer: Medicare Other

## 2019-11-16 ENCOUNTER — Inpatient Hospital Stay (HOSPITAL_COMMUNITY)
Admission: EM | Admit: 2019-11-16 | Discharge: 2019-12-09 | DRG: 871 | Disposition: E | Payer: Medicare Other | Attending: Internal Medicine | Admitting: Internal Medicine

## 2019-11-16 DIAGNOSIS — K219 Gastro-esophageal reflux disease without esophagitis: Secondary | ICD-10-CM | POA: Diagnosis present

## 2019-11-16 DIAGNOSIS — E872 Acidosis: Secondary | ICD-10-CM | POA: Diagnosis present

## 2019-11-16 DIAGNOSIS — Z7951 Long term (current) use of inhaled steroids: Secondary | ICD-10-CM

## 2019-11-16 DIAGNOSIS — N179 Acute kidney failure, unspecified: Secondary | ICD-10-CM | POA: Diagnosis present

## 2019-11-16 DIAGNOSIS — I5033 Acute on chronic diastolic (congestive) heart failure: Secondary | ICD-10-CM | POA: Diagnosis present

## 2019-11-16 DIAGNOSIS — Z91048 Other nonmedicinal substance allergy status: Secondary | ICD-10-CM

## 2019-11-16 DIAGNOSIS — Z8543 Personal history of malignant neoplasm of ovary: Secondary | ICD-10-CM

## 2019-11-16 DIAGNOSIS — Z8616 Personal history of COVID-19: Secondary | ICD-10-CM | POA: Diagnosis not present

## 2019-11-16 DIAGNOSIS — J189 Pneumonia, unspecified organism: Secondary | ICD-10-CM | POA: Diagnosis present

## 2019-11-16 DIAGNOSIS — Z8051 Family history of malignant neoplasm of kidney: Secondary | ICD-10-CM

## 2019-11-16 DIAGNOSIS — G92 Toxic encephalopathy: Secondary | ICD-10-CM | POA: Diagnosis present

## 2019-11-16 DIAGNOSIS — Z888 Allergy status to other drugs, medicaments and biological substances status: Secondary | ICD-10-CM

## 2019-11-16 DIAGNOSIS — Z452 Encounter for adjustment and management of vascular access device: Secondary | ICD-10-CM

## 2019-11-16 DIAGNOSIS — U071 COVID-19: Secondary | ICD-10-CM

## 2019-11-16 DIAGNOSIS — E785 Hyperlipidemia, unspecified: Secondary | ICD-10-CM | POA: Diagnosis present

## 2019-11-16 DIAGNOSIS — Z9911 Dependence on respirator [ventilator] status: Secondary | ICD-10-CM

## 2019-11-16 DIAGNOSIS — J1282 Pneumonia due to coronavirus disease 2019: Secondary | ICD-10-CM | POA: Diagnosis present

## 2019-11-16 DIAGNOSIS — G4733 Obstructive sleep apnea (adult) (pediatric): Secondary | ICD-10-CM | POA: Diagnosis present

## 2019-11-16 DIAGNOSIS — E1140 Type 2 diabetes mellitus with diabetic neuropathy, unspecified: Secondary | ICD-10-CM | POA: Diagnosis present

## 2019-11-16 DIAGNOSIS — M109 Gout, unspecified: Secondary | ICD-10-CM | POA: Diagnosis present

## 2019-11-16 DIAGNOSIS — A419 Sepsis, unspecified organism: Secondary | ICD-10-CM | POA: Diagnosis not present

## 2019-11-16 DIAGNOSIS — Z87891 Personal history of nicotine dependence: Secondary | ICD-10-CM

## 2019-11-16 DIAGNOSIS — Z96653 Presence of artificial knee joint, bilateral: Secondary | ICD-10-CM | POA: Diagnosis present

## 2019-11-16 DIAGNOSIS — Z91040 Latex allergy status: Secondary | ICD-10-CM

## 2019-11-16 DIAGNOSIS — Z6839 Body mass index (BMI) 39.0-39.9, adult: Secondary | ICD-10-CM

## 2019-11-16 DIAGNOSIS — J80 Acute respiratory distress syndrome: Secondary | ICD-10-CM | POA: Diagnosis present

## 2019-11-16 DIAGNOSIS — J440 Chronic obstructive pulmonary disease with acute lower respiratory infection: Secondary | ICD-10-CM | POA: Diagnosis present

## 2019-11-16 DIAGNOSIS — M009 Pyogenic arthritis, unspecified: Secondary | ICD-10-CM | POA: Diagnosis present

## 2019-11-16 DIAGNOSIS — E1165 Type 2 diabetes mellitus with hyperglycemia: Secondary | ICD-10-CM | POA: Diagnosis present

## 2019-11-16 DIAGNOSIS — Z887 Allergy status to serum and vaccine status: Secondary | ICD-10-CM

## 2019-11-16 DIAGNOSIS — Z952 Presence of prosthetic heart valve: Secondary | ICD-10-CM

## 2019-11-16 DIAGNOSIS — Z9981 Dependence on supplemental oxygen: Secondary | ICD-10-CM

## 2019-11-16 DIAGNOSIS — R32 Unspecified urinary incontinence: Secondary | ICD-10-CM | POA: Diagnosis present

## 2019-11-16 DIAGNOSIS — Z86718 Personal history of other venous thrombosis and embolism: Secondary | ICD-10-CM

## 2019-11-16 DIAGNOSIS — Z853 Personal history of malignant neoplasm of breast: Secondary | ICD-10-CM

## 2019-11-16 DIAGNOSIS — Z7984 Long term (current) use of oral hypoglycemic drugs: Secondary | ICD-10-CM

## 2019-11-16 DIAGNOSIS — Y95 Nosocomial condition: Secondary | ICD-10-CM | POA: Diagnosis present

## 2019-11-16 DIAGNOSIS — I110 Hypertensive heart disease with heart failure: Secondary | ICD-10-CM | POA: Diagnosis present

## 2019-11-16 DIAGNOSIS — Z8249 Family history of ischemic heart disease and other diseases of the circulatory system: Secondary | ICD-10-CM

## 2019-11-16 DIAGNOSIS — I480 Paroxysmal atrial fibrillation: Secondary | ICD-10-CM | POA: Diagnosis present

## 2019-11-16 DIAGNOSIS — Z9012 Acquired absence of left breast and nipple: Secondary | ICD-10-CM

## 2019-11-16 DIAGNOSIS — F419 Anxiety disorder, unspecified: Secondary | ICD-10-CM | POA: Diagnosis present

## 2019-11-16 DIAGNOSIS — F329 Major depressive disorder, single episode, unspecified: Secondary | ICD-10-CM | POA: Diagnosis present

## 2019-11-16 DIAGNOSIS — R0602 Shortness of breath: Secondary | ICD-10-CM

## 2019-11-16 DIAGNOSIS — Z882 Allergy status to sulfonamides status: Secondary | ICD-10-CM

## 2019-11-16 DIAGNOSIS — J9600 Acute respiratory failure, unspecified whether with hypoxia or hypercapnia: Secondary | ICD-10-CM

## 2019-11-16 DIAGNOSIS — Z9221 Personal history of antineoplastic chemotherapy: Secondary | ICD-10-CM

## 2019-11-16 DIAGNOSIS — R6521 Severe sepsis with septic shock: Secondary | ICD-10-CM | POA: Diagnosis present

## 2019-11-16 DIAGNOSIS — J9621 Acute and chronic respiratory failure with hypoxia: Secondary | ICD-10-CM | POA: Diagnosis not present

## 2019-11-16 DIAGNOSIS — Z7901 Long term (current) use of anticoagulants: Secondary | ICD-10-CM

## 2019-11-16 DIAGNOSIS — L8990 Pressure ulcer of unspecified site, unspecified stage: Secondary | ICD-10-CM | POA: Insufficient documentation

## 2019-11-16 LAB — CBC WITH DIFFERENTIAL/PLATELET
Abs Immature Granulocytes: 0.67 10*3/uL — ABNORMAL HIGH (ref 0.00–0.07)
Basophils Absolute: 0.1 10*3/uL (ref 0.0–0.1)
Basophils Relative: 1 %
Eosinophils Absolute: 0 10*3/uL (ref 0.0–0.5)
Eosinophils Relative: 0 %
HCT: 38.6 % (ref 36.0–46.0)
Hemoglobin: 12.7 g/dL (ref 12.0–15.0)
Immature Granulocytes: 4 %
Lymphocytes Relative: 3 %
Lymphs Abs: 0.6 10*3/uL — ABNORMAL LOW (ref 0.7–4.0)
MCH: 31.1 pg (ref 26.0–34.0)
MCHC: 32.9 g/dL (ref 30.0–36.0)
MCV: 94.4 fL (ref 80.0–100.0)
Monocytes Absolute: 0.6 10*3/uL (ref 0.1–1.0)
Monocytes Relative: 3 %
Neutro Abs: 16.8 10*3/uL — ABNORMAL HIGH (ref 1.7–7.7)
Neutrophils Relative %: 89 %
Platelets: 621 10*3/uL — ABNORMAL HIGH (ref 150–400)
RBC: 4.09 MIL/uL (ref 3.87–5.11)
RDW: 15.2 % (ref 11.5–15.5)
WBC: 18.8 10*3/uL — ABNORMAL HIGH (ref 4.0–10.5)
nRBC: 0 % (ref 0.0–0.2)

## 2019-11-16 LAB — COMPREHENSIVE METABOLIC PANEL
ALT: 32 U/L (ref 0–44)
AST: 45 U/L — ABNORMAL HIGH (ref 15–41)
Albumin: 3 g/dL — ABNORMAL LOW (ref 3.5–5.0)
Alkaline Phosphatase: 59 U/L (ref 38–126)
Anion gap: 17 — ABNORMAL HIGH (ref 5–15)
BUN: 33 mg/dL — ABNORMAL HIGH (ref 8–23)
CO2: 22 mmol/L (ref 22–32)
Calcium: 9.1 mg/dL (ref 8.9–10.3)
Chloride: 96 mmol/L — ABNORMAL LOW (ref 98–111)
Creatinine, Ser: 1.21 mg/dL — ABNORMAL HIGH (ref 0.44–1.00)
GFR calc Af Amer: 50 mL/min — ABNORMAL LOW (ref >60)
GFR calc non Af Amer: 43 mL/min — ABNORMAL LOW (ref >60)
Glucose, Bld: 197 mg/dL — ABNORMAL HIGH (ref 70–99)
Potassium: 4.9 mmol/L (ref 3.5–5.1)
Sodium: 135 mmol/L (ref 135–145)
Total Bilirubin: 1.5 mg/dL — ABNORMAL HIGH (ref 0.3–1.2)
Total Protein: 6.9 g/dL (ref 6.5–8.1)

## 2019-11-16 LAB — D-DIMER, QUANTITATIVE: D-Dimer, Quant: 0.55 ug/mL-FEU — ABNORMAL HIGH (ref 0.00–0.50)

## 2019-11-16 LAB — CBG MONITORING, ED: Glucose-Capillary: 194 mg/dL — ABNORMAL HIGH (ref 70–99)

## 2019-11-16 LAB — LACTIC ACID, PLASMA
Lactic Acid, Venous: 4.4 mmol/L (ref 0.5–1.9)
Lactic Acid, Venous: 4.3 mmol/L (ref 0.5–1.9)

## 2019-11-16 LAB — BRAIN NATRIURETIC PEPTIDE: B Natriuretic Peptide: 384.2 pg/mL — ABNORMAL HIGH (ref 0.0–100.0)

## 2019-11-16 MED ORDER — DULOXETINE HCL 30 MG PO CPEP
30.0000 mg | ORAL_CAPSULE | Freq: Two times a day (BID) | ORAL | Status: DC
  Administered 2019-11-16 – 2019-11-17 (×3): 30 mg via ORAL
  Filled 2019-11-16 (×5): qty 1

## 2019-11-16 MED ORDER — METHYLPREDNISOLONE SODIUM SUCC 40 MG IJ SOLR
40.0000 mg | INTRAMUSCULAR | Status: DC
  Administered 2019-11-17: 40 mg via INTRAVENOUS
  Filled 2019-11-16 (×2): qty 1

## 2019-11-16 MED ORDER — FUROSEMIDE 20 MG PO TABS: 40.0000 mg | ORAL_TABLET | Freq: Every day | ORAL | Status: DC

## 2019-11-16 MED ORDER — INSULIN ASPART 100 UNIT/ML ~~LOC~~ SOLN: 0.0000 [IU] | Freq: Every day | SUBCUTANEOUS | Status: DC

## 2019-11-16 MED ORDER — VITAMIN D 25 MCG (1000 UNIT) PO TABS
1000.0000 [IU] | ORAL_TABLET | Freq: Every day | ORAL | Status: DC
  Administered 2019-11-16 – 2019-11-17 (×2): 1000 [IU] via ORAL
  Filled 2019-11-16 (×2): qty 1

## 2019-11-16 MED ORDER — INSULIN ASPART 100 UNIT/ML ~~LOC~~ SOLN
0.0000 [IU] | Freq: Three times a day (TID) | SUBCUTANEOUS | Status: DC
  Administered 2019-11-18: 2 [IU] via SUBCUTANEOUS

## 2019-11-16 MED ORDER — DOXYCYCLINE HYCLATE 100 MG PO TABS
100.0000 mg | ORAL_TABLET | Freq: Two times a day (BID) | ORAL | Status: DC
  Administered 2019-11-16 – 2019-11-17 (×3): 100 mg via ORAL
  Filled 2019-11-16 (×3): qty 1

## 2019-11-16 MED ORDER — AMIODARONE HCL 200 MG PO TABS
200.0000 mg | ORAL_TABLET | Freq: Every day | ORAL | Status: DC
  Administered 2019-11-17: 200 mg via ORAL
  Filled 2019-11-16: qty 1

## 2019-11-16 MED ORDER — HYDROCODONE-HOMATROPINE 5-1.5 MG/5ML PO SYRP: 5.0000 mL | ORAL_SOLUTION | Freq: Two times a day (BID) | ORAL | Status: DC | PRN

## 2019-11-16 MED ORDER — ATORVASTATIN CALCIUM 40 MG PO TABS
40.0000 mg | ORAL_TABLET | Freq: Every day | ORAL | Status: DC
  Administered 2019-11-17: 40 mg via ORAL
  Filled 2019-11-16: qty 1

## 2019-11-16 MED ORDER — NOREPINEPHRINE 4 MG/250ML-% IV SOLN
INTRAVENOUS | Status: AC
  Administered 2019-11-16: 8 ug/min via INTRAVENOUS
  Filled 2019-11-16: qty 250

## 2019-11-16 MED ORDER — METHYLPREDNISOLONE SODIUM SUCC 125 MG IJ SOLR
60.0000 mg | Freq: Once | INTRAMUSCULAR | Status: AC
  Administered 2019-11-16: 60 mg via INTRAVENOUS
  Filled 2019-11-16: qty 2

## 2019-11-16 MED ORDER — SODIUM CHLORIDE 0.9 % IV SOLN
2.0000 g | Freq: Once | INTRAVENOUS | Status: AC
  Administered 2019-11-16: 2 g via INTRAVENOUS
  Filled 2019-11-16: qty 2

## 2019-11-16 MED ORDER — ALBUTEROL SULFATE HFA 108 (90 BASE) MCG/ACT IN AERS
2.0000 | INHALATION_SPRAY | Freq: Four times a day (QID) | RESPIRATORY_TRACT | Status: DC
  Administered 2019-11-16 – 2019-11-17 (×5): 2 via RESPIRATORY_TRACT
  Filled 2019-11-16 (×3): qty 6.7

## 2019-11-16 MED ORDER — IPRATROPIUM BROMIDE HFA 17 MCG/ACT IN AERS
2.0000 | INHALATION_SPRAY | Freq: Four times a day (QID) | RESPIRATORY_TRACT | Status: DC
  Administered 2019-11-17 (×5): 2 via RESPIRATORY_TRACT
  Filled 2019-11-16 (×2): qty 12.9

## 2019-11-16 MED ORDER — SODIUM CHLORIDE 0.9 % IV BOLUS
250.0000 mL | Freq: Once | INTRAVENOUS | Status: AC
  Administered 2019-11-16: 250 mL via INTRAVENOUS

## 2019-11-16 MED ORDER — ACETAMINOPHEN 325 MG PO TABS: 650.0000 mg | ORAL_TABLET | Freq: Four times a day (QID) | ORAL | Status: DC | PRN

## 2019-11-16 MED ORDER — SODIUM CHLORIDE 0.9 % IV BOLUS
500.0000 mL | Freq: Once | INTRAVENOUS | Status: AC
  Administered 2019-11-16: 500 mL via INTRAVENOUS

## 2019-11-16 MED ORDER — ASCORBIC ACID 500 MG PO TABS
500.0000 mg | ORAL_TABLET | Freq: Every day | ORAL | Status: DC
  Administered 2019-11-16 – 2019-11-17 (×2): 500 mg via ORAL
  Filled 2019-11-16 (×2): qty 1

## 2019-11-16 MED ORDER — POTASSIUM CHLORIDE CRYS ER 10 MEQ PO TBCR
10.0000 meq | EXTENDED_RELEASE_TABLET | Freq: Every day | ORAL | Status: DC
  Administered 2019-11-17: 10 meq via ORAL
  Filled 2019-11-16 (×2): qty 1

## 2019-11-16 MED ORDER — APIXABAN 5 MG PO TABS
5.0000 mg | ORAL_TABLET | Freq: Two times a day (BID) | ORAL | Status: DC
  Administered 2019-11-16 – 2019-11-17 (×3): 5 mg via ORAL
  Filled 2019-11-16 (×3): qty 1

## 2019-11-16 MED ORDER — MONTELUKAST SODIUM 10 MG PO TABS
10.0000 mg | ORAL_TABLET | Freq: Every day | ORAL | Status: DC
  Administered 2019-11-16 – 2019-11-17 (×2): 10 mg via ORAL
  Filled 2019-11-16 (×3): qty 1

## 2019-11-16 MED ORDER — ALBUTEROL SULFATE (2.5 MG/3ML) 0.083% IN NEBU
2.5000 mg | INHALATION_SOLUTION | RESPIRATORY_TRACT | Status: DC | PRN
  Administered 2019-11-17: 2.5 mg via RESPIRATORY_TRACT
  Filled 2019-11-16: qty 3

## 2019-11-16 MED ORDER — VANCOMYCIN HCL 1750 MG/350ML IV SOLN
1750.0000 mg | Freq: Once | INTRAVENOUS | Status: AC
  Administered 2019-11-16: 1750 mg via INTRAVENOUS
  Filled 2019-11-16: qty 350

## 2019-11-16 MED ORDER — VANCOMYCIN HCL IN DEXTROSE 1-5 GM/200ML-% IV SOLN: 1000.0000 mg | INTRAVENOUS | Status: DC

## 2019-11-16 MED ORDER — ZINC SULFATE 220 (50 ZN) MG PO CAPS
220.0000 mg | ORAL_CAPSULE | Freq: Every day | ORAL | Status: DC
  Administered 2019-11-16 – 2019-11-17 (×2): 220 mg via ORAL
  Filled 2019-11-16 (×2): qty 1

## 2019-11-16 MED ORDER — SODIUM CHLORIDE 0.9 % IV SOLN
2.0000 g | Freq: Two times a day (BID) | INTRAVENOUS | Status: DC
  Administered 2019-11-17 – 2019-11-18 (×4): 2 g via INTRAVENOUS
  Filled 2019-11-16 (×4): qty 2

## 2019-11-16 MED ORDER — NOREPINEPHRINE 4 MG/250ML-% IV SOLN: 0.0000 ug/min | INTRAVENOUS | Status: DC

## 2019-11-16 MED ORDER — PANTOPRAZOLE SODIUM 40 MG PO TBEC
40.0000 mg | DELAYED_RELEASE_TABLET | Freq: Every day | ORAL | Status: DC
  Administered 2019-11-17: 40 mg via ORAL
  Filled 2019-11-16: qty 1

## 2019-11-16 NOTE — Progress Notes (Signed)
Called and updated the patient's daughter via phone.  All questions answered.

## 2019-11-16 NOTE — ED Triage Notes (Signed)
BIB [facility] w/ complaints of SOB. Pt had COVID a few weeks ago but now has PNA and on steroid regimen w/ no noted improvement.  BP-151/66 HR 70 o2 95 % on 10 L on NRB  resp 22. NAD at this time.

## 2019-11-16 NOTE — Progress Notes (Signed)
Pharmacy Antibiotic Note  Tanya [name] is a 74 y.o. female admitted on 11/17/2019 with pneumonia.  Pharmacy has been consulted for Vancomycin dosing.   Height: 4\' 8"  (142.2 cm) Weight: 80 kg (176 lb 5.9 oz) IBW/kg (Calculated) : 36.3  Temp (24hrs), Avg:98.1 F (36.7 C), Min:98.1 F (36.7 C), Max:98.1 F (36.7 C)  Recent Labs  Lab 11/11/19 0156 11/14/2019 1815 11/30/2019 1835 11/15/2019 1951  WBC 7.1 18.8*  --   --   CREATININE 1.02* 1.21*  --   --   LATICACIDVEN  --   --  4.4* 4.3*    Estimated Creatinine Clearance: 33.6 mL/min (A) (by C-G formula based on SCr of 1.21 mg/dL (H)).    Allergies  Allergen Reactions  . Fentanyl Anaphylaxis  . Pneumovax [Pneumococcal Polysaccharide Vaccine] Swelling and Other (See Comments)    Arm swelling  . Pneumococcal Vaccine Swelling and Other (See Comments)    Arm swelling  . Sulfa Antibiotics Other (See Comments)    UNSPECIFIED CHILDHOOD REACTION  . Tape Other (See Comments)    Latex-containing Band-Aids PEEL OFF THE SKIN!!  . Latex Rash  . Levaquin [Levofloxacin] Rash    Antimicrobials this admission: 9/8 Cefepime >>  9/8 Vancomycin >>   Dose adjustments this admission: N/a  Microbiology results: Pending   Plan:  - Cefepime 2g IV q12h - Vancomycin 1750mg  IV x 1 dose  - Followed by Vancomycin 1000mg  IV q24h - Goal trough ~ 15 - Monitor patients renal function and urine output  - De-escalate ABX when appropriate   Thank you for allowing pharmacy to be a part of this patient's care.  Duanne Limerick PharmD. BCPS 11/29/2019 9:59 PM

## 2019-11-16 NOTE — ED Provider Notes (Signed)
[facility] EMERGENCY DEPARTMENT Provider Note   CSN: [id number] Arrival date & time: 11/18/2019  1737     History Chief Complaint  Patient presents with  . Shortness of Breath    Tanya [name] is a 74 y.o. female presented for evaluation of shortness of breath.  Pt states she was admitted to the hospital ~3 wks ago for covid, diagnosed with Covid pneumonia.  She was discharged on 3 L of oxygen.  She felt her symptoms got much better until 3 days ago, when she had worsening shortness of breath.  Since then, she has had severe shortness of breath, mildly productive cough, and overall feels weak.  She cannot keep her oxygen sats above 90% on her nasal cannula this morning, had to use CPAP.  She is currently being treated with prednisone for her Covid pneumonia.  She denies fevers, chills, chest pain, nausea, vomiting, abdominal pain, urinary symptoms, abnormal bowel movements.  She is on Eliquis for A. fib, has been taking it every day.   Additional history obtained from EMS.  Per EMS, patient was satting 85% on her home 3 L of oxygen.  Additional his Eliquis, hypertension, diabetes, OSA on CPAP, GERD, breast cancer status post chemo and left mastectomy, CHF, status post TAVR.   HPI     Past Medical History:  Diagnosis Date  . Asthma   . Breast cancer in female [facility])    1994 s/p chemo and radical L mastectomy  . Diabetes ([place])   . DVT (deep venous thrombosis) (HCC)    in shoulder after shoulder replacement  . Full dentures   . GERD (gastroesophageal reflux disease)   . Gout   . H/O total shoulder replacement   . History of knee replacement, total, bilateral   . 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 now on daily doxycycline.  Marland Kitchen HTN (hypertension)   . Lymphedema of left arm    s/p mastectomy with lymph node removal for breast cancer  . Morbid obesity ([place])   . Neuropathy   . OSA on CPAP   . Ovarian  cancer ([place])    2012 s/p chemo and radical hysterectomy  . PAF (paroxysmal atrial fibrillation) (HCC)    on Eliquis  . Pulmonary nodules    seen on pre TAVR CT scan. needs follow up CT in 06/2019  . S/P TAVR (transcatheter aortic valve replacement)    23 mm Edwards Sapien 3 transcatheter heart valve placed via percutaneous right transfemoral approach   . Severe aortic stenosis   . Urine incontinence     Patient Active Problem List   Diagnosis Date Noted  . HCAP (healthcare-associated pneumonia) 11/23/2019  . COVID-19 11/07/2019  . Need for vaccination 09/03/2019  . S/P TAVR (transcatheter aortic valve replacement)   . Pulmonary nodules   . 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 Acute on chronic respiratory failure with hypoxia ([address])   . Demand ischemia ([place])   . Moderate mitral stenosis   . Acute on chronic diastolic CHF (congestive heart failure) ([place])   . Urine incontinence   . Severe aortic stenosis   . History of septic arthritis   . PAF (paroxysmal atrial fibrillation) ([place])   . Ovarian cancer ([place])   . OSA on CPAP   . Neuropathy   . HTN (hypertension)   . History of knee replacement, total, bilateral   . H/O total shoulder replacement   .  Gout   . GERD (gastroesophageal reflux disease)   . Full dentures   . Diabetes ([place])   . Breast cancer in female [facility])   . Asthma   . Lymphedema of left arm   . Morbid obesity ([place])   . Acute respiratory failure with hypercapnia ([place]) 06/22/2018  . DKA (diabetic ketoacidoses) ([place]) 06/22/2018  . Elevated brain natriuretic peptide (BNP) level 06/22/2018  . Neutrophilic leukocytosis 60/12/9321    Past Surgical History:  Procedure Laterality Date  . MASTECTOMY Left 1993  . RIGHT/LEFT HEART CATH AND CORONARY ANGIOGRAPHY N/A 06/25/2018   Procedure: RIGHT/LEFT HEART CATH AND CORONARY ANGIOGRAPHY;  Surgeon: Belva Crome, MD;  Location: [place] CV LAB;  Service: Cardiovascular;  Laterality: N/A;    . TEE WITHOUT CARDIOVERSION N/A 06/29/2018   Procedure: TRANSESOPHAGEAL ECHOCARDIOGRAM (TEE);  Surgeon: Sherren Mocha, MD;  Location: [place];  Service: Open Heart Surgery;  Laterality: N/A;  . TRANSCATHETER AORTIC VALVE REPLACEMENT, TRANSFEMORAL N/A 06/29/2018   Procedure: TRANSCATHETER AORTIC VALVE REPLACEMENT, TRANSFEMORAL;  Surgeon: Sherren Mocha, MD;  Location: [facility];  Service: Open Heart Surgery;  Laterality: N/A;     OB History   No obstetric history on file.     Family History  Problem Relation Age of Onset  . Hypertension Mother   . Heart disease Father   . Renal cancer Brother     Social History   Tobacco Use  . Smoking status: Former Smoker    Types: Cigarettes  . Smokeless tobacco: Never Used  Vaping Use  . Vaping Use: Never used  Substance Use Topics  . Alcohol use: Never  . Drug use: Never    Home Medications Prior to Admission medications   Medication Sig Start Date End Date Taking? Authorizing Provider  acetaminophen (TYLENOL) 325 MG tablet Take 650 mg by mouth every 6 (six) hours as needed for mild pain or fever.    [provider]  albuterol (PROVENTIL) (2.5 MG/3ML) 0.083% nebulizer solution Take 2.5 mg by nebulization every 4 (four) hours as needed for wheezing or shortness of breath.    [provider]  albuterol (VENTOLIN HFA) 108 (90 Base) MCG/ACT inhaler Inhale 2 puffs into the lungs every 4 (four) hours as needed for wheezing or shortness of breath.     [provider]  amiodarone (PACERONE) 200 MG tablet Take 1 tablet (200 mg total) by mouth daily. 10/20/19   Croitoru, Mihai, MD  apixaban (ELIQUIS) 5 MG TABS tablet Take 5 mg by mouth 2 (two) times daily.    [provider]  atorvastatin (LIPITOR) 40 MG tablet Take 1 tablet (40 mg total) by mouth daily. 10/20/19   Croitoru, Mihai, MD  azelastine (ASTELIN) 0.1 % nasal spray Place 1 spray into both nostrils 2 (two) times daily as needed for rhinitis or allergies.      [provider]  calcium carbonate (OS-CAL - DOSED IN MG OF ELEMENTAL CALCIUM) 1250 (500 Ca) MG tablet Take 1 tablet by mouth 2 (two) times daily with a meal.    [provider]  doxycycline (VIBRA-TABS) 100 MG tablet Take 100 mg by mouth 2 (two) times daily.    [provider]  DULoxetine (CYMBALTA) 30 MG capsule Take 30 mg by mouth 2 (two) times daily.    [provider]  esomeprazole (NEXIUM) 40 MG capsule Take 40 mg by mouth daily before breakfast.     [provider]  fexofenadine (ALLEGRA) 180 MG tablet Take 180 mg by mouth daily.  08/22/19   [provider]  fluticasone furoate-vilanterol (BREO ELLIPTA) 200-25 MCG/INH AEPB Inhale 1 puff into the lungs daily.    [provider]  furosemide (LASIX) 40 MG tablet Take 1 tablet (40 mg total) by mouth daily. Patient taking differently: Take 40 mg by mouth 2 (two) times daily.  10/20/19   Croitoru, Mihai, MD  HYDROcodone-homatropine (HYCODAN) 5-1.5 MG/5ML syrup Take 5 mLs by mouth every 12 (twelve) hours as needed for cough. 11/04/19   [provider]  Magnesium 400 MG TABS Take 1 tablet 400 mg by mouth once daily 09/27/19   Croitoru, Mihai, MD  metFORMIN (GLUCOPHAGE) 500 MG tablet Take 1,000 mg by mouth 2 (two) times daily with a meal.    [provider]  metoprolol succinate (TOPROL XL) 25 MG 24 hr tablet Take 1 tablet (25 mg total) by mouth daily. Take with or immediately following a meal. Patient taking differently: Take 25 mg by mouth in the morning and at bedtime. Take with or immediately following a meal. 10/20/19   Croitoru, Mihai, MD  montelukast (SINGULAIR) 10 MG tablet Take 10 mg by mouth at bedtime.    [provider]  Multiple Vitamin (MULTIVITAMIN WITH MINERALS) TABS tablet Take 1 tablet by mouth daily.    [provider]  potassium chloride (KLOR-CON) 10 MEQ tablet Take 1 tablet (10 mEq total) by mouth daily. 12/16/18   Croitoru, Dani Gobble, MD    predniSONE (DELTASONE) 10 MG tablet Take 40 mg daily for 1 day, 30 mg daily for 1 day, 20 mg daily for 1 days,10 mg daily for 1 day, then stop 11/11/19   Ghimire, Henreitta Leber, MD  sitaGLIPtin (JANUVIA) 50 MG tablet Take 50 mg by mouth daily.     [provider]  vitamin E 100 UNIT capsule Take 100 Units by mouth daily.    [provider]    Allergies    Fentanyl, Pneumovax [pneumococcal polysaccharide vaccine], Pneumococcal vaccine, Sulfa antibiotics, Tape, Latex, and Levaquin [levofloxacin]  Review of Systems   Review of Systems  Respiratory: Positive for cough and shortness of breath.   Neurological: Positive for weakness.  Hematological: Bruises/bleeds easily.  All other systems reviewed and are negative.   Physical Exam Updated Vital Signs BP (!) 96/43   Pulse 63   Temp 98.1 F (36.7 C) (Oral)   Resp (!) 27   Ht 4\' 8"  (1.422 m)   Wt 80 kg   SpO2 92%   BMI 39.54 kg/m   Physical Exam Vitals and nursing note reviewed.  Constitutional:      General: She is not in acute distress.    Appearance: She is well-developed.     Comments: Appears nontoxic  HENT:     Head: Normocephalic and atraumatic.  Eyes:     Conjunctiva/sclera: Conjunctivae normal.     Pupils: Pupils are equal, round, and reactive to light.  Cardiovascular:     Rate and Rhythm: Normal rate and regular rhythm.     Pulses: Normal pulses.  Pulmonary:     Breath sounds: Rhonchi present.     Comments: On NRB, SpO2 97%. Speaking in full sentences. Scattered crackles on exam.  Abdominal:     General: There is no distension.     Palpations: Abdomen is soft. There is no mass.     Tenderness: There is no abdominal tenderness. There is no guarding or rebound.  Musculoskeletal:        General: Normal range of motion.  Cervical back: Normal range of motion and neck supple.     Right lower leg: No edema.     Left lower leg: No edema.  Skin:    General: Skin is warm and dry.     Capillary  Refill: Capillary refill takes less than 2 seconds.  Neurological:     Mental Status: She is alert and oriented to person, place, and time.     ED Results / Procedures / Treatments   Labs (all labs ordered are listed, but only abnormal results are displayed) Labs Reviewed  CBC WITH DIFFERENTIAL/PLATELET - Abnormal; Notable for the following components:      Result Value   WBC 18.8 (*)    Platelets 621 (*)    Neutro Abs 16.8 (*)    Lymphs Abs 0.6 (*)    Abs Immature Granulocytes 0.67 (*)    All other components within normal limits  COMPREHENSIVE METABOLIC PANEL - Abnormal; Notable for the following components:   Chloride 96 (*)    Glucose, Bld 197 (*)    BUN 33 (*)    Creatinine, Ser 1.21 (*)    Albumin 3.0 (*)    AST 45 (*)    Total Bilirubin 1.5 (*)    GFR calc non Af Amer 43 (*)    GFR calc Af Amer 50 (*)    Anion gap 17 (*)    All other components within normal limits  D-DIMER, QUANTITATIVE (NOT AT [facility]) - Abnormal; Notable for the following components:   D-Dimer, Quant 0.55 (*)    All other components within normal limits  LACTIC ACID, PLASMA - Abnormal; Notable for the following components:   Lactic Acid, Venous 4.4 (*)    All other components within normal limits  BRAIN NATRIURETIC PEPTIDE - Abnormal; Notable for the following components:   B Natriuretic Peptide 384.2 (*)    All other components within normal limits  LACTIC ACID, PLASMA    EKG EKG Interpretation  Date/Time:  Wednesday November 16 2019 18:23:05 EDT Ventricular Rate:  66 PR Interval:    QRS Duration: 165 QT Interval:  502 QTC Calculation: 527 R Axis:   -7 Text Interpretation: Sinus rhythm Left bundle branch block Baseline wander in lead(s) III since last tracing no significant change Confirmed by Daleen Bo [phone]) on 12/04/2019 6:35:35 PM   Radiology DG Chest 2 View  Result Date: 12/01/2019 CLINICAL DATA:  Shortness of breath, history of COVID EXAM: CHEST - 2 VIEW COMPARISON:   06/07/2019 FINDINGS: Frontal and lateral views of the chest demonstrate an unremarkable cardiac silhouette. Stable postsurgical changes from aortic valve replacement. Persistent interstitial prominence throughout the lungs, with minimal residual bilateral ground-glass airspace disease. Overall significant improvement since prior exam. No effusion or pneumothorax. IMPRESSION: 1. Marked improvement in the interstitial and ground-glass opacity seen previously, consistent with improving pneumonia. Due to Electronically Signed   By: Randa Ngo M.D.   On: 11/18/2019 18:16    Procedures .Critical Care Performed by: Franchot Heidelberg, PA-C Authorized by: Franchot Heidelberg, PA-C   Critical care provider statement:    Critical care time (minutes):  35   Critical care time was exclusive of:  Separately billable procedures and treating other patients and teaching time   Critical care was necessary to treat or prevent imminent or life-threatening deterioration of the following conditions:  Respiratory failure   Critical care was time spent personally by me on the following activities:  Blood draw for specimens, development of treatment plan with patient or  surrogate, evaluation of patient's response to treatment, examination of patient, obtaining history from patient or surrogate, ordering and performing treatments and interventions, ordering and review of laboratory studies, ordering and review of radiographic studies, pulse oximetry, re-evaluation of patient's condition and review of old charts   I assumed direction of critical care for this patient from another provider in my specialty: no   Comments:     Pt wiht worsening hypoxia requiring O2 and admission.    (including critical care time)  Medications Ordered in ED Medications  vancomycin (VANCOREADY) IVPB 1750 mg/350 mL (has no administration in time range)  methylPREDNISolone sodium succinate (SOLU-MEDROL) 125 mg/2 mL injection 60 mg (has no  administration in time range)  sodium chloride 0.9 % bolus 500 mL (has no administration in time range)  ceFEPIme (MAXIPIME) 2 g in sodium chloride 0.9 % 100 mL IVPB (0 g Intravenous Stopped 12/02/2019 1940)  sodium chloride 0.9 % bolus 500 mL (500 mLs Intravenous New Bag/Given 12/08/2019 1858)    ED Course  I have reviewed the triage vital signs and the nursing notes.  Pertinent labs & imaging results that were available during my care of the patient were reviewed by me and considered in my medical decision making (see chart for details).    MDM Rules/Calculators/A&P                          Patient presenting for evaluation of worsening shortness of breath in the setting of a recent Covid infection.  On exam, is on a nonrebreather is appears nontoxic.  She is in no acute distress.  She does have some crackles on exam.  History of CHF, consider acute on chronic CHF, however patient without significant peripheral edema.  Consider worsening Covid pneumonia.  Consider secondary bacterial pneumonia, patient without fever.  Consider PE, though less likely as patient is on Eliquis.  Will obtain labs, chest x-ray, and reassess.  IV antibiotics started for possible bacterial pneumonia.  Labs show mild leukocytosis of 18, this could be elevated in the setting of prednisone.  Electrolytes stable.  X-ray viewed interpreted by me, shows bilateral hazy opacities, however per radiology this is improved from previous.  Informed by RN that patient's blood pressure soft, in the 90s.  Will give small fluid bolus to support.  Dimer, when age-adjusted, is normal.  BNP mildly elevated, as such we will have to be careful with fluids.  Considering patient's worsening hypoxia and shortness of breath, she will need to be admitted for further work-up and evaluation.  Discussed with Dr. [place] Crane from triad hospitalist service, patient to be admitted.  Final Clinical Impression(s) / ED Diagnoses Final diagnoses:  Acute on chronic  respiratory failure with hypoxia [facility])    Rx / DC Orders ED Discharge Orders    None       Franchot Heidelberg, PA-C 11/11/2019 1949    Daleen Bo, MD 12-10-19 1212

## 2019-11-16 NOTE — ED Notes (Addendum)
Daughter Sharon updated.  

## 2019-11-16 NOTE — ED Notes (Signed)
[name], daughter, [phone] would like an update when available

## 2019-11-16 NOTE — H&P (Signed)
History and Physical  Tanya [name] QVZ:[id number] DOB: September 29, 1945 DOA: 11/11/2019  Referring physician: Junious Silk, [place] PCP: Zara Chess, NP  Outpatient Specialists: None Patient coming from: Home  Chief Complaint: Worsening shortness of breath for 2 days.  HPI: Tanya [name] is a 74 y.o. female with medical history significant for recent COVID-19 viral pneumonia, admitted on 11/07/2019 and discharged on 11/11/2019 on 3 L nasal cannula, paroxysmal A. fib on Eliquis, COPD, OSA on CPAP, history of septic arthritis on suppressive doxycycline, essential hypertension, type 2 diabetes, severe aortic stenosis status post TAVR in April 2020, history of ovarian cancer status post chemotherapy and radical hysterectomy who returns with worsening shortness of breath of 2 days duration, now requiring 6 L.  Associated with a productive cough.  Denies chest pain or any GI symptoms.  No noted fevers at home.  States she was feeling better after discharge up until 2 days ago when her shortness of breath gradually worsened with a persistent cough.  She returns for further evaluation and management of her symptoms.  ED Course: O2 saturation 91% on 6 L, chest x-ray showing bilateral lower lobe infiltrates.  Lab studies remarkable for leukocytosis with WBC 18 K and neutrophilia 16.8.  Started on IV antibiotics empirically IV vancomycin and cefepime, also given 1 dose of Solu-Medrol 60 mg once.  TRH asked to admit for HCAP.  Review of Systems: Review of systems as noted in the HPI. All other systems reviewed and are negative.   Past Medical History:  Diagnosis Date  . Asthma   . Breast cancer in female [facility])    1994 s/p chemo and radical L mastectomy  . Diabetes ([place])   . DVT (deep venous thrombosis) (HCC)    in shoulder after shoulder replacement  . Full dentures   . GERD (gastroesophageal reflux disease)   . Gout   . H/O total shoulder replacement   . History of knee replacement, total, bilateral   . History  of septic arthritis    infected prosthetic shoulder and knee s/p debridement and abx. now on daily doxycycline.  Marland Kitchen HTN (hypertension)   . Lymphedema of left arm    s/p mastectomy with lymph node removal for breast cancer  . Morbid obesity ([place])   . Neuropathy   . OSA on CPAP   . Ovarian cancer ([place])    2012 s/p chemo and radical hysterectomy  . PAF (paroxysmal atrial fibrillation) (HCC)    on Eliquis  . Pulmonary nodules    seen on pre TAVR CT scan. needs follow up CT in 06/2019  . S/P TAVR (transcatheter aortic valve replacement)    23 mm Edwards Sapien 3 transcatheter heart valve placed via percutaneous right transfemoral approach   . Severe aortic stenosis   . Urine incontinence    Past Surgical History:  Procedure Laterality Date  . MASTECTOMY Left 1993  . RIGHT/LEFT HEART CATH AND CORONARY ANGIOGRAPHY N/A 06/25/2018   Procedure: RIGHT/LEFT HEART CATH AND CORONARY ANGIOGRAPHY;  Surgeon: Belva Crome, MD;  Location: [facility];  Service: Cardiovascular;  Laterality: N/A;  . TEE WITHOUT CARDIOVERSION N/A 06/29/2018   Procedure: TRANSESOPHAGEAL ECHOCARDIOGRAM (TEE);  Surgeon: Sherren Mocha, MD;  Location: [facility];  Service: Open Heart Surgery;  Laterality: N/A;  . TRANSCATHETER AORTIC VALVE REPLACEMENT, TRANSFEMORAL N/A 06/29/2018   Procedure: TRANSCATHETER AORTIC VALVE REPLACEMENT, TRANSFEMORAL;  Surgeon: Sherren Mocha, MD;  Location: [facility];  Service: Open Heart Surgery;  Laterality: N/A;    Social History:  reports that she has quit smoking. Her smoking use included cigarettes. She has never used smokeless tobacco. She reports that she does not drink alcohol and does not use drugs.   Allergies  Allergen Reactions  . Fentanyl Anaphylaxis  . Pneumovax [Pneumococcal Polysaccharide Vaccine] Swelling and Other (See Comments)    Arm swelling  . Pneumococcal Vaccine Swelling and Other (See Comments)    Arm swelling  . Sulfa Antibiotics Other (See Comments)    UNSPECIFIED  CHILDHOOD REACTION  . Tape Other (See Comments)    Latex-containing Band-Aids PEEL OFF THE SKIN!!  . Latex Rash  . Levaquin [Levofloxacin] Rash    Family History  Problem Relation Age of Onset  . Hypertension Mother   . Heart disease Father   . Renal cancer Brother       Prior to Admission medications   Medication Sig Start Date End Date Taking? Authorizing Provider  acetaminophen (TYLENOL) 325 MG tablet Take 650 mg by mouth every 6 (six) hours as needed for mild pain or fever.    [provider]  albuterol (PROVENTIL) (2.5 MG/3ML) 0.083% nebulizer solution Take 2.5 mg by nebulization every 4 (four) hours as needed for wheezing or shortness of breath.    [provider]  albuterol (VENTOLIN HFA) 108 (90 Base) MCG/ACT inhaler Inhale 2 puffs into the lungs every 4 (four) hours as needed for wheezing or shortness of breath.     [provider]  amiodarone (PACERONE) 200 MG tablet Take 1 tablet (200 mg total) by mouth daily. 10/20/19   Croitoru, Mihai, MD  apixaban (ELIQUIS) 5 MG TABS tablet Take 5 mg by mouth 2 (two) times daily.    [provider]  atorvastatin (LIPITOR) 40 MG tablet Take 1 tablet (40 mg total) by mouth daily. 10/20/19   Croitoru, Mihai, MD  azelastine (ASTELIN) 0.1 % nasal spray Place 1 spray into both nostrils 2 (two) times daily as needed for rhinitis or allergies.     [provider]  calcium carbonate (OS-CAL - DOSED IN MG OF ELEMENTAL CALCIUM) 1250 (500 Ca) MG tablet Take 1 tablet by mouth 2 (two) times daily with a meal.    [provider]  doxycycline (VIBRA-TABS) 100 MG tablet Take 100 mg by mouth 2 (two) times daily.    [provider]  DULoxetine (CYMBALTA) 30 MG capsule Take 30 mg by mouth 2 (two) times daily.    [provider]  esomeprazole (NEXIUM) 40 MG capsule Take 40 mg by mouth daily before breakfast.     [provider]  fexofenadine (ALLEGRA) 180 MG tablet Take 180 mg by  mouth daily. 08/22/19   [provider]  fluticasone furoate-vilanterol (BREO ELLIPTA) 200-25 MCG/INH AEPB Inhale 1 puff into the lungs daily.    [provider]  furosemide (LASIX) 40 MG tablet Take 1 tablet (40 mg total) by mouth daily. Patient taking differently: Take 40 mg by mouth 2 (two) times daily.  10/20/19   Croitoru, Mihai, MD  HYDROcodone-homatropine (HYCODAN) 5-1.5 MG/5ML syrup Take 5 mLs by mouth every 12 (twelve) hours as needed for cough. 11/04/19   [provider]  Magnesium 400 MG TABS Take 1 tablet 400 mg by mouth once daily 09/27/19   Croitoru, Mihai, MD  metFORMIN (GLUCOPHAGE) 500 MG tablet Take 1,000 mg by mouth 2 (two) times daily with a meal.    [provider]  metoprolol succinate (TOPROL XL) 25 MG 24 hr tablet Take 1 tablet (25 mg total) by  mouth daily. Take with or immediately following a meal. Patient taking differently: Take 25 mg by mouth in the morning and at bedtime. Take with or immediately following a meal. 10/20/19   Croitoru, Mihai, MD  montelukast (SINGULAIR) 10 MG tablet Take 10 mg by mouth at bedtime.    [provider]  Multiple Vitamin (MULTIVITAMIN WITH MINERALS) TABS tablet Take 1 tablet by mouth daily.    [provider]  potassium chloride (KLOR-CON) 10 MEQ tablet Take 1 tablet (10 mEq total) by mouth daily. 12/16/18   Croitoru, Dani Gobble, MD  predniSONE (DELTASONE) 10 MG tablet Take 40 mg daily for 1 day, 30 mg daily for 1 day, 20 mg daily for 1 days,10 mg daily for 1 day, then stop 11/11/19   Ghimire, Henreitta Leber, MD  sitaGLIPtin (JANUVIA) 50 MG tablet Take 50 mg by mouth daily.     [provider]  vitamin E 100 UNIT capsule Take 100 Units by mouth daily.    [provider]    Physical Exam: BP (!) 96/43   Pulse 63   Temp 98.1 F (36.7 C) (Oral)   Resp (!) 27   Ht 4\' 8"  (1.422 m)   Wt 80 kg   SpO2 92%   BMI 39.54 kg/m   . General: 74 y.o. year-old female well developed well  nourished in no acute distress.  Alert and oriented x3. . Cardiovascular: Regular rate and rhythm with no rubs or gallops.  No thyromegaly or JVD noted.  No lower extremity edema. 2/4 pulses in all 4 extremities. Marland Kitchen Respiratory: Rales mostly at bases.  Mild wheezing noted diffusely.  Good inspiratory effort. . Abdomen: Soft nontender nondistended with normal bowel sounds x4 quadrants. . Muskuloskeletal: No cyanosis, clubbing or edema noted bilaterally . Neuro: CN II-XII intact, strength, sensation, reflexes . Skin: No ulcerative lesions noted or rashes . Psychiatry: Judgement and insight appear normal. Mood is appropriate for condition and setting          Labs on Admission:  Basic Metabolic Panel: Recent Labs  Lab 11/11/19 0156 11/23/2019 1815  NA 138 135  K 4.1 4.9  CL 98 96*  CO2 27 22  GLUCOSE 132* 197*  BUN 26* 33*  CREATININE 1.02* 1.21*  CALCIUM 9.2 9.1  MG 1.4*  --    Liver Function Tests: Recent Labs  Lab 11/11/19 0156 12/01/2019 1815  AST 26 45*  ALT 28 32  ALKPHOS 36* 59  BILITOT 1.1 1.5*  PROT 6.6 6.9  ALBUMIN 3.2* 3.0*   No results for input(s): LIPASE, AMYLASE in the last 168 hours. No results for input(s): AMMONIA in the last 168 hours. CBC: Recent Labs  Lab 11/11/19 0156 11/20/2019 1815  WBC 7.1 18.8*  NEUTROABS 5.9 16.8*  HGB 11.0* 12.7  HCT 32.7* 38.6  MCV 92.9 94.4  PLT 348 621*   Cardiac Enzymes: No results for input(s): CKTOTAL, CKMB, CKMBINDEX, TROPONINI in the last 168 hours.  BNP (last 3 results) Recent Labs    09/20/19 0226 11/07/19 1851 11/10/2019 1815  BNP 575.7* 481.7* 384.2*    ProBNP (last 3 results) No results for input(s): PROBNP in the last 8760 hours.  CBG: Recent Labs  Lab 11/10/19 1958 11/10/19 2329 11/11/19 0408 11/11/19 0727 11/11/19 1205  GLUCAP 232* 106* 153* 140* 107*    Radiological Exams on Admission: DG Chest 2 View  Result Date: 11/26/2019 CLINICAL DATA:  Shortness of breath, history of COVID EXAM:  CHEST - 2 VIEW COMPARISON:  06/07/2019  FINDINGS: Frontal and lateral views of the chest demonstrate an unremarkable cardiac silhouette. Stable postsurgical changes from aortic valve replacement. Persistent interstitial prominence throughout the lungs, with minimal residual bilateral ground-glass airspace disease. Overall significant improvement since prior exam. No effusion or pneumothorax. IMPRESSION: 1. Marked improvement in the interstitial and ground-glass opacity seen previously, consistent with improving pneumonia. Due to Electronically Signed   By: Randa Ngo M.D.   On: 12/01/2019 18:16    EKG: I independently viewed the EKG done and my findings are as followed: Sinus rhythm rate of 66.  LBBB.  QTc 527.  Assessment/Plan Present on Admission: . HCAP (healthcare-associated pneumonia)  Active Problems:   HCAP (healthcare-associated pneumonia)  HCAP, in the setting of recent COVID-19 viral pneumonia Presented with leukocytosis WBC 18.8K, neutrophilia of 16.8, chest x-ray personally reviewed revealed bilateral lower lobe infiltrates Started on cefepime and IV vancomycin empirically in the ED, continue. Will obtain sputum culture and continue empiric IV antibiotics Monitor fever curve and WBC  Recent COVID-19 viral pneumonia Positive COVID-19 test on 11/07/2019 Continue airborne precautions Maintain O2 saturation greater than 92% Proning as tolerated Vitamin D3, C, zinc, continue Continue IV steroids  Acute hypoxic hypercarbic respiratory failure/OSA on CPAP Discharged on 11/11/2019 on 3 L, currently requiring 6 L Maintain O2 saturation greater than 92% Bronchodilators, pulmonary toilet Incentive spirometer, flutter valve Proning as tolerated Management as stated above  Prolonged QTC From 12-lead EKG done in the ED showing QTC 527 Avoid QTC prolonging agents Repeat twelve-lead EKG in the morning  Paroxysmal A. Fib Rate controlled on Toprol-XL, hold for now due to soft blood  pressures Resume Eliquis for primary CVA prevention Monitor on telemetry  Hyperlipidemia Resume statin  Chronic anxiety/depression Resume Cymbalta  Type 2 diabetes, hyperglycemia Insulin sliding scale Hold off oral hypoglycemics  History of septic arthritis on suppressive doxycycline Continue doxycycline Obtain blood cultures peripherally x2 with elevated WBC and neutrophilia    DVT prophylaxis: Eliquis  Code Status: Full code as stated by the patient herself  Family Communication: None at bedside.  Disposition Plan: Admit to telemetry medical  Consults called: None  Admission status: Inpatient status   Status is: Inpatient    Dispo:  Patient From: Home  Planned Disposition: Home  Expected discharge date: 11/18/19  Medically stable for discharge: No, ongoing management of HCAP in the setting of recent COVID-19 viral pneumonia.        Kayleen Memos MD [facility] Pager [phone]  If 7PM-7AM, please contact night-coverage [email or web address] Password [facility]  11/17/2019, 7:26 PM

## 2019-11-16 NOTE — Progress Notes (Signed)
Patient's blood pressure dropped acutely with SBP in the 70s.  Started Levophed to maintain MAP greater than 65.  Will admit to progressive, higher level of care.

## 2019-11-16 NOTE — ED Provider Notes (Signed)
  Face-to-face evaluation   History: She presents for evaluation of shortness of breath with increased oxygen requirement, following recent hospitalization for Covid infection and treatment for pneumonia.   Physical exam: Alert elderly female who is comfortable on facemask oxygen.  No respiratory distress.  No wheezing.  No peripheral edema.  Medical screening examination/treatment/procedure(s) were conducted as a shared visit with non-physician practitioner(s) and myself.  I personally evaluated the patient during the encounter    Daleen Bo, MD 08-Dec-2019 1212

## 2019-11-17 DIAGNOSIS — I959 Hypotension, unspecified: Secondary | ICD-10-CM | POA: Insufficient documentation

## 2019-11-17 DIAGNOSIS — R531 Weakness: Secondary | ICD-10-CM | POA: Insufficient documentation

## 2019-11-17 DIAGNOSIS — J9621 Acute and chronic respiratory failure with hypoxia: Secondary | ICD-10-CM

## 2019-11-17 DIAGNOSIS — J189 Pneumonia, unspecified organism: Secondary | ICD-10-CM | POA: Diagnosis present

## 2019-11-17 LAB — EXPECTORATED SPUTUM ASSESSMENT W GRAM STAIN, RFLX TO RESP C

## 2019-11-17 LAB — CBC WITH DIFFERENTIAL/PLATELET
Abs Immature Granulocytes: 0.66 10*3/uL — ABNORMAL HIGH (ref 0.00–0.07)
Basophils Absolute: 0.1 10*3/uL (ref 0.0–0.1)
Basophils Relative: 0 %
Eosinophils Absolute: 0 10*3/uL (ref 0.0–0.5)
Eosinophils Relative: 0 %
HCT: 31.7 % — ABNORMAL LOW (ref 36.0–46.0)
Hemoglobin: 10.3 g/dL — ABNORMAL LOW (ref 12.0–15.0)
Immature Granulocytes: 4 %
Lymphocytes Relative: 7 %
Lymphs Abs: 1.1 10*3/uL (ref 0.7–4.0)
MCH: 30.8 pg (ref 26.0–34.0)
MCHC: 32.5 g/dL (ref 30.0–36.0)
MCV: 94.9 fL (ref 80.0–100.0)
Monocytes Absolute: 1.2 10*3/uL — ABNORMAL HIGH (ref 0.1–1.0)
Monocytes Relative: 7 %
Neutro Abs: 13.3 10*3/uL — ABNORMAL HIGH (ref 1.7–7.7)
Neutrophils Relative %: 82 %
Platelets: 563 10*3/uL — ABNORMAL HIGH (ref 150–400)
RBC: 3.34 MIL/uL — ABNORMAL LOW (ref 3.87–5.11)
RDW: 15 % (ref 11.5–15.5)
WBC: 16.3 10*3/uL — ABNORMAL HIGH (ref 4.0–10.5)
nRBC: 0 % (ref 0.0–0.2)

## 2019-11-17 LAB — COMPREHENSIVE METABOLIC PANEL
ALT: 24 U/L (ref 0–44)
AST: 23 U/L (ref 15–41)
Albumin: 2.3 g/dL — ABNORMAL LOW (ref 3.5–5.0)
Alkaline Phosphatase: 46 U/L (ref 38–126)
Anion gap: 14 (ref 5–15)
BUN: 27 mg/dL — ABNORMAL HIGH (ref 8–23)
CO2: 20 mmol/L — ABNORMAL LOW (ref 22–32)
Calcium: 8 mg/dL — ABNORMAL LOW (ref 8.9–10.3)
Chloride: 105 mmol/L (ref 98–111)
Creatinine, Ser: 0.86 mg/dL (ref 0.44–1.00)
GFR calc Af Amer: >60 mL/min (ref >60)
GFR calc non Af Amer: >60 mL/min (ref >60)
Glucose, Bld: 153 mg/dL — ABNORMAL HIGH (ref 70–99)
Potassium: 4.1 mmol/L (ref 3.5–5.1)
Sodium: 139 mmol/L (ref 135–145)
Total Bilirubin: 0.5 mg/dL (ref 0.3–1.2)
Total Protein: 5.5 g/dL — ABNORMAL LOW (ref 6.5–8.1)

## 2019-11-17 LAB — LACTIC ACID, PLASMA: Lactic Acid, Venous: 3.4 mmol/L (ref 0.5–1.9)

## 2019-11-17 LAB — HEMOGLOBIN A1C
Hgb A1c MFr Bld: 6.4 % — ABNORMAL HIGH (ref 4.8–5.6)
Mean Plasma Glucose: 136.98 mg/dL

## 2019-11-17 LAB — CBG MONITORING, ED
Glucose-Capillary: 147 mg/dL — ABNORMAL HIGH (ref 70–99)
Glucose-Capillary: 146 mg/dL — ABNORMAL HIGH (ref 70–99)
Glucose-Capillary: 116 mg/dL — ABNORMAL HIGH (ref 70–99)
Glucose-Capillary: 182 mg/dL — ABNORMAL HIGH (ref 70–99)

## 2019-11-17 LAB — PROCALCITONIN: Procalcitonin: <0.1 ng/mL

## 2019-11-17 LAB — MRSA PCR SCREENING: MRSA by PCR: NEGATIVE

## 2019-11-17 MED ORDER — SODIUM CHLORIDE 0.9 % IV SOLN: INTRAVENOUS | Status: DC

## 2019-11-17 MED ORDER — LACTATED RINGERS IV BOLUS
500.0000 mL | Freq: Once | INTRAVENOUS | Status: AC
  Administered 2019-11-17: 500 mL via INTRAVENOUS

## 2019-11-17 MED ORDER — LACTATED RINGERS IV SOLN: INTRAVENOUS | Status: DC

## 2019-11-17 MED ORDER — VANCOMYCIN HCL 750 MG/150ML IV SOLN
750.0000 mg | Freq: Two times a day (BID) | INTRAVENOUS | Status: DC
  Administered 2019-11-17 – 2019-11-18 (×3): 750 mg via INTRAVENOUS
  Filled 2019-11-17 (×5): qty 150

## 2019-11-17 NOTE — ED Notes (Signed)
Per previous RN, care plan for pt discussed with Critical Care; O2 sats to remain above 80%, SBP to remain above 90. Will continue to closely monitor pt to ensure care plan continued.

## 2019-11-17 NOTE — ED Notes (Signed)
Tanya [name] with critical care made aware of patient's status, Sp02 range between 70-mid 80s; patient is tachypnic as well, worst with talking, eating and [name] movements. Patient is placed in a comfortable position, high flow o2 in placed. Will continue to monitor closely.

## 2019-11-17 NOTE — ED Notes (Signed)
Patient resting in bed, eyes closed, visible chest rise, NAD. Noted improvement of Sp02 to low 90s.

## 2019-11-17 NOTE — Progress Notes (Signed)
Pt placed on cpap with 15L [place] running underneath the mask for oxygenation.

## 2019-11-17 NOTE — Progress Notes (Signed)
NAME:  Tanya [name], MRN:  [id number], DOB:  [birth year], LOS: 1 ADMISSION DATE:  11/27/2019, CONSULTATION DATE:  11/17/19 REFERRING MD:  [place] Crane - [facility], CHIEF COMPLAINT:  Hypotension : SOB  Brief History    74 yo F admitted with suspected HCAP following recent hospitalization for COVID-19 PNA, now with worsening hypoxia. Became hypotensive in ED and was started on pressors.  History of present illness   74 yo PMH Afib on eliquis, OSA, COPD, DM2, s/p TAVr, hx septic arthritis on suppressive doxy, breast and ovarian cancer, recent hx COVID 19 PNA discharged on 11/11/19 on [facility], presents to [facility] ED 9/8 with worsening SOB and productive cough. SOB has progressed x 2 days, with worsening productive cough. No associate fevers.  In ED pt is evaluated at SpO2 91% on 6L. CXr reveals bilateral lower lobe infiltrates WBC 18. She was started on empiric abx for HCAP. While receiving vanc, became hypotensive and was started on peripheral NE.   PCCM is consulted in this setting.    Past Medical History  Breast cancer Ovarian cancer DM DVT GERD Gout S/p bilateral TKR S/p TSR Septic arthritis, now on doxy HTN  OSA on CPAP Severe AS s/p TAVR   Significant Hospital Events   9/8 presents to ED with suspected HCAP. Admitted to [facility]. Became hypotensive while receiving vanc, started on pressors. PCCM consulted    Consults:  PCCM  Procedures:    Significant Diagnostic Tests:  9/8 CXR> bibasilar opacities   Micro Data:  8/30 COVID-19 positive 9/8 sputum>  9/8 BCX>   Antimicrobials:  9/8 vanc>  9/8 cefepime>  9/8 doxy >> (home med for septic arthritis)   Interim history/subjective:  Levo down to 2. Hypoxic to 80s on 6-9L [place], being transitioned to HFNC.  Objective   Blood pressure (!) 105/53, pulse 73, temperature 98.1 F (36.7 C), temperature source Oral, resp. rate (!) 21, height 4\' 8"  (1.422 m), weight 80 kg, SpO2 (!) 84 %.        Intake/Output Summary (Last 24 hours) at 11/17/2019 0833 Last  data filed at 11/17/2019 0524 Gross per 24 hour  Intake 1000 ml  Output 500 ml  Net 500 ml   Filed Weights   11/27/2019 1742  Weight: 80 kg    Examination: General: WDWN 74 chronically ill older adult F, reclined in bed NAD  HENT: NCAT, [place] in place, EOMI Lungs: Mild intermittent tachypnea.  Scattered rhonchi. Cardiovascular: RRR, no M/R/G. Abdomen: Obese.  S/NT/ND. Extremities: LUE lymphedema. No obvious joint deformity, no cyanosis or clubbing. Neuro: AAO x 4 following commands.   Assessment & Plan:   Acute on chronic hypoxic respiratory failure (on 2L nocturnal O2 and PRN during daytime), HCAP, recent COVID-19 viral PNA (initially tested positive 8/30, s/p course of steroids/remdesivir/baricitinib/diuresis). OSA on CPAP.  - Continue supplemental O2 as needed to maintain SpO2 > 80 as long as doesn't show sign of respiratory distress / fatigue (accessory muscle use, nasal flaring, etc). - Continue steroids for now. - Continue contact/airborne precautions. - Cont abx, follow up sputum cx. - IS, CPT, pulm hygiene. - Self proning as tolerated - BDs.  Hypotension, improving gradually and NE being weaned off.  Timing suspicious for vanc related vasodilation vs hypovolema vs septic shock - Continue fluids. - Continue to wean NE as able for SBP > 90 as long as mental status remains normal. - Cont empiric abx, slow vanc gtt rate for subsequent doses.  - Follow cultures.  AKI - Supportive care.  Afib. -  Holding toprol until off pressors. - Continue Eliquis, amiodarone.  DM. -SSI.  Hx septic arthritis  - Continue suppressive doxycycline.  Generalized deconditioning. - PT eval.   Best practice:  Diet: Heart healthy. Pain/Anxiety/Delirium protocol (if indicated): na VAP protocol (if indicated): na DVT prophylaxis: eliquis GI prophylaxis: protonix Glucose control: SSI Mobility: as tolerated Code Status: Full Family Communication: pt updated Disposition: ICU  CC time:  35 min.   Montey Hora, [facility] 11/17/2019, 8:52 AM

## 2019-11-17 NOTE — ED Notes (Signed)
Pt's daughter has been updated on pt's current status and plan of care.

## 2019-11-17 NOTE — ED Notes (Signed)
Call the daughter Mina Marble at [phone]

## 2019-11-17 NOTE — Consult Note (Signed)
NAME:  Tanya [name], MRN:  [id number], DOB:  1946/02/10, LOS: 1 ADMISSION DATE:  11/29/2019, CONSULTATION DATE:  11/17/19 REFERRING MD:  [place] Crane - [facility], CHIEF COMPLAINT:  Hypotension : SOB  Brief History    73 yo F admitted with suspected HCAP following recent hospitalization for COVID-19 PNA, now with worsening hypoxia. Became hypotensive in ED and was started on pressors.  History of present illness   74 yo PMH Afib on eliquis, OSA, COPD, DM2, s/p TAVr, hx septic arthritis on suppressive doxy, breast and ovarian cancer, recent hx COVID 19 PNA discharged on 11/11/19 on [facility], presents to [facility] ED 9/8 with worsening SOB and productive cough. SOB has progressed x 2 days, with worsening productive cough. No associate fevers.  In ED pt is evaluated at SpO2 91% on 6L. CXr reveals bilateral lower lobe infiltrates WBC 18. She was started on empiric abx for HCAP. While receiving vanc, became hypotensive and was started on peripheral NE.   PCCM is consulted in this setting.    Past Medical History  Breast cancer Ovarian cancer DM DVT GERD Gout S/p bilateral TKR S/p TSR Septic arthritis, now on doxy HTN  OSA on CPAP Severe AS s/p TAVR   Significant Hospital Events   9/8 presents to ED with suspected HCAP. Admitted to [facility]. Became hypotensive while receiving vanc, started on pressors. PCCM consulted    Consults:  PCCM  Procedures:    Significant Diagnostic Tests:  9/8 CXR> bibasilar opacities   Micro Data:  8/30 COVID-19 positive 9/8 sputum>  9/8 BCX>   Antimicrobials:  9/8 vanc>  9/8 cefepime>  9/8 doxy >> (home med for septic arthritis)    Interim history/subjective:  Seen in ED, on peripheral pressors with SBP 135   States that the room in the ED is too warm for her preference   Objective   Blood pressure (!) 124/43, pulse 63, temperature 98.1 F (36.7 C), temperature source Oral, resp. rate 17, height 4\' 8"  (1.422 m), weight 80 kg, SpO2 91 %.        Intake/Output Summary  (Last 24 hours) at 11/17/2019 0203 Last data filed at 11/17/2019 2000 Gross per 24 hour  Intake 500 ml  Output --  Net 500 ml   Filed Weights   11/15/2019 1742  Weight: 80 kg    Examination: General: WDWN chronically ill older adult F, reclined in bed NAD  HENT: NCAt pink mm trachea midline anicteric sclera Lungs: Scattered rhonchi. Symmetrical chest expansion no accessory use  Cardiovascular: RRR s1s2. Cap refill < 3 seconds  Abdomen: soft round ndnt + bowel sounds  Extremities: LUe lymphedema. No obvious joint deformity, no cyanosis or clubbing  Neuro: AAO x 4 following commands PERRL  GU: defer   Resolved Hospital Problem list     Assessment & Plan:   Acute hypoxic respiratory failure, HCAP, recent COVID-19 viral PNA OSA on CPAP  P -contact/airborne  -cont abx, follow up sputum cx -IS, CPT, pulm hygiene -Mobility  -proning as tolerated -SpO2 goal > 88%  -BDs  Hypotension, improving -timing suspicious for vanc related vasodilation vs hypovolema vs septic shock P -IVF-- LR 140ml/hr -wean peripheral NE as able for SBP > 90  -cont empiric abx, slow vanc gtt rate for subsequent doses  -follow BCx, Sputum Cx   AKI -IVF -trend renal indices, UOP   Afib -holding toprol until off pressors -Eliquis  -amio  DM -SSI   Hx septic arthritis  -continue suppressive doxycycline    Best  practice:  Diet: regular Pain/Anxiety/Delirium protocol (if indicated): na VAP protocol (if indicated): na DVT prophylaxis: eliquis GI prophylaxis: protonix Glucose control: SSI Mobility: as tolerated Code Status: Full Family Communication: pt updated Disposition: ICU  Labs   CBC: Recent Labs  Lab 11/11/19 0156 11/10/2019 1815  WBC 7.1 18.8*  NEUTROABS 5.9 16.8*  HGB 11.0* 12.7  HCT 32.7* 38.6  MCV 92.9 94.4  PLT 348 621*    Basic Metabolic Panel: Recent Labs  Lab 11/11/19 0156 11/29/2019 1815  NA 138 135  K 4.1 4.9  CL 98 96*  CO2 27 22  GLUCOSE 132* 197*  BUN  26* 33*  CREATININE 1.02* 1.21*  CALCIUM 9.2 9.1  MG 1.4*  --    GFR: Estimated Creatinine Clearance: 33.6 mL/min (A) (by C-G formula based on SCr of 1.21 mg/dL (H)). Recent Labs  Lab 11/11/19 0156 11/28/2019 1815 11/18/2019 1835 11/15/2019 1951  WBC 7.1 18.8*  --   --   LATICACIDVEN  --   --  4.4* 4.3*    Liver Function Tests: Recent Labs  Lab 11/11/19 0156 11/22/2019 1815  AST 26 45*  ALT 28 32  ALKPHOS 36* 59  BILITOT 1.1 1.5*  PROT 6.6 6.9  ALBUMIN 3.2* 3.0*   No results for input(s): LIPASE, AMYLASE in the last 168 hours. No results for input(s): AMMONIA in the last 168 hours.  ABG    Component Value Date/Time   PHART 7.348 (L) 11/07/2019 2118   PCO2ART 47.5 11/07/2019 2118   PO2ART 118 (H) 11/07/2019 2118   HCO3 25.7 11/07/2019 2118   TCO2 27 11/07/2019 2118   ACIDBASEDEF 4.0 (H) 11/07/2019 1857   O2SAT 98.0 11/07/2019 2118     Coagulation Profile: No results for input(s): INR, PROTIME in the last 168 hours.  Cardiac Enzymes: No results for input(s): CKTOTAL, CKMB, CKMBINDEX, TROPONINI in the last 168 hours.  HbA1C: Hgb A1c MFr Bld  Date/Time Value Ref Range Status  09/03/2019 09:40 AM 7.4 (H) 4.8 - 5.6 % Final    Comment:    (NOTE) Pre diabetes:          5.7%-6.4%  Diabetes:              >6.4%  Glycemic control for   <7.0% adults with diabetes   06/23/2018 09:30 AM 8.5 (H) 4.8 - 5.6 % Final    Comment:    (NOTE) Pre diabetes:          5.7%-6.4% Diabetes:              >6.4% Glycemic control for   <7.0% adults with diabetes     CBG: Recent Labs  Lab 11/10/19 2329 11/11/19 0408 11/11/19 0727 11/11/19 1205 11/17/2019 2302  GLUCAP 106* 153* 140* 107* 194*    Review of Systems:   + SOB, + cough, + productive sputum - chest pain,  - palpitations - edema - dizziness, - HA - confusion - changes in urinary frequency, - urinary pain, -blood in urine -n/v/d/c  -changes to hair skin nails  Past Medical History  She,  has a past medical  history of Asthma, Breast cancer in female ([place]), Diabetes ([place]), DVT (deep venous thrombosis) ([place]), Full dentures, GERD (gastroesophageal reflux disease), Gout, H/O total shoulder replacement, History of knee replacement, total, bilateral, History of septic arthritis, HTN (hypertension), Lymphedema of left arm, Morbid obesity ([place]), Neuropathy, OSA on CPAP, Ovarian cancer ([place]), PAF (paroxysmal atrial fibrillation) ([place]), Pulmonary nodules, S/P TAVR (transcatheter aortic valve replacement), Severe aortic  stenosis, and Urine incontinence.   Surgical History    Past Surgical History:  Procedure Laterality Date  . MASTECTOMY Left 1993  . RIGHT/LEFT HEART CATH AND CORONARY ANGIOGRAPHY N/A 06/25/2018   Procedure: RIGHT/LEFT HEART CATH AND CORONARY ANGIOGRAPHY;  Surgeon: Belva Crome, MD;  Location: [facility];  Service: Cardiovascular;  Laterality: N/A;  . TEE WITHOUT CARDIOVERSION N/A 06/29/2018   Procedure: TRANSESOPHAGEAL ECHOCARDIOGRAM (TEE);  Surgeon: Sherren Mocha, MD;  Location: [facility];  Service: Open Heart Surgery;  Laterality: N/A;  . TRANSCATHETER AORTIC VALVE REPLACEMENT, TRANSFEMORAL N/A 06/29/2018   Procedure: TRANSCATHETER AORTIC VALVE REPLACEMENT, TRANSFEMORAL;  Surgeon: Sherren Mocha, MD;  Location: [facility];  Service: Open Heart Surgery;  Laterality: N/A;     Social History   reports that she has quit smoking. Her smoking use included cigarettes. She has never used smokeless tobacco. She reports that she does not drink alcohol and does not use drugs.   Family History   Her family history includes Heart disease in her father; Hypertension in her mother; Renal cancer in her brother.   Allergies Allergies  Allergen Reactions  . Fentanyl Anaphylaxis  . Pneumovax [Pneumococcal Polysaccharide Vaccine] Swelling and Other (See Comments)    Arm swelling  . Pneumococcal Vaccine Swelling and Other (See Comments)    Arm swelling  . Sulfa Antibiotics Other (See Comments)     UNSPECIFIED CHILDHOOD REACTION  . Tape Other (See Comments)    Latex-containing Band-Aids PEEL OFF THE SKIN!!  . Latex Rash  . Levaquin [Levofloxacin] Rash     Home Medications  Prior to Admission medications   Medication Sig Start Date End Date Taking? Authorizing Provider  acetaminophen (TYLENOL) 325 MG tablet Take 650 mg by mouth every 6 (six) hours as needed for mild pain or fever.   Yes [provider]  albuterol (PROVENTIL) (2.5 MG/3ML) 0.083% nebulizer solution Take 2.5 mg by nebulization every 4 (four) hours as needed for wheezing or shortness of breath.   Yes [provider]  albuterol (VENTOLIN HFA) 108 (90 Base) MCG/ACT inhaler Inhale 2 puffs into the lungs every 4 (four) hours as needed for wheezing or shortness of breath.    Yes [provider]  amiodarone (PACERONE) 200 MG tablet Take 1 tablet (200 mg total) by mouth daily. 10/20/19  Yes Croitoru, Mihai, MD  apixaban (ELIQUIS) 5 MG TABS tablet Take 5 mg by mouth 2 (two) times daily.   Yes [provider]  atorvastatin (LIPITOR) 40 MG tablet Take 1 tablet (40 mg total) by mouth daily. 10/20/19  Yes Croitoru, Mihai, MD  azelastine (ASTELIN) 0.1 % nasal spray Place 1 spray into both nostrils 2 (two) times daily as needed for rhinitis or allergies.    Yes [provider]  calcium carbonate (OS-CAL - DOSED IN MG OF ELEMENTAL CALCIUM) 1250 (500 Ca) MG tablet Take 1 tablet by mouth 2 (two) times daily with a meal.   Yes [provider]  doxycycline (VIBRA-TABS) 100 MG tablet Take 100 mg by mouth 2 (two) times daily.   Yes [provider]  DULoxetine (CYMBALTA) 30 MG capsule Take 30 mg by mouth 2 (two) times daily.   Yes [provider]  esomeprazole (NEXIUM) 40 MG capsule Take 40 mg by mouth daily before breakfast.    Yes [provider]  fexofenadine (ALLEGRA) 180 MG tablet Take 180 mg by mouth daily. 08/22/19  Yes [provider]  fluticasone  furoate-vilanterol (BREO ELLIPTA) 200-25 MCG/INH AEPB Inhale 1  puff into the lungs daily.   Yes [provider]  furosemide (LASIX) 40 MG tablet Take 1 tablet (40 mg total) by mouth daily. Patient taking differently: Take 40 mg by mouth 2 (two) times daily.  10/20/19  Yes Croitoru, Mihai, MD  HYDROcodone-homatropine (HYCODAN) 5-1.5 MG/5ML syrup Take 5 mLs by mouth every 12 (twelve) hours as needed for cough. 11/04/19  Yes [provider]  Magnesium 400 MG TABS Take 1 tablet 400 mg by mouth once daily 09/27/19  Yes Croitoru, Mihai, MD  metFORMIN (GLUCOPHAGE) 500 MG tablet Take 1,000 mg by mouth 2 (two) times daily with a meal.   Yes [provider]  metoprolol succinate (TOPROL XL) 25 MG 24 hr tablet Take 1 tablet (25 mg total) by mouth daily. Take with or immediately following a meal. 10/20/19  Yes Croitoru, Mihai, MD  montelukast (SINGULAIR) 10 MG tablet Take 10 mg by mouth at bedtime.   Yes [provider]  Multiple Vitamin (MULTIVITAMIN WITH MINERALS) TABS tablet Take 1 tablet by mouth daily.   Yes [provider]  potassium chloride (KLOR-CON) 10 MEQ tablet Take 1 tablet (10 mEq total) by mouth daily. 12/16/18  Yes Croitoru, Mihai, MD  sitaGLIPtin (JANUVIA) 50 MG tablet Take 50 mg by mouth daily.    Yes [provider]  vitamin E 100 UNIT capsule Take 100 Units by mouth daily.   Yes [provider]  predniSONE (DELTASONE) 10 MG tablet Take 40 mg daily for 1 day, 30 mg daily for 1 day, 20 mg daily for 1 days,10 mg daily for 1 day, then stop Patient not taking: Reported on 11/23/2019 11/11/19   Jonetta Osgood, MD     Critical care time: 50 minutes     CRITICAL CARE Performed by: Cristal Generous   Total critical care time: 50 minutes  Critical care time was exclusive of separately billable procedures and treating other patients.  Critical care was necessary to treat or prevent imminent or life-threatening deterioration.  Critical  care was time spent personally by me on the following activities: development of treatment plan with patient and/or surrogate as well as nursing, discussions with consultants, evaluation of patient's response to treatment, examination of patient, obtaining history from patient or surrogate, ordering and performing treatments and interventions, ordering and review of laboratory studies, ordering and review of radiographic studies, pulse oximetry and re-evaluation of patient's condition.  Eliseo Gum MSN, AGACNP-BC [place] [phone] If no answer, [phone] 11/17/2019, 2:26 AM

## 2019-11-18 ENCOUNTER — Inpatient Hospital Stay (HOSPITAL_COMMUNITY): Payer: Medicare Other

## 2019-11-18 DIAGNOSIS — E872 Acidosis, unspecified: Secondary | ICD-10-CM | POA: Insufficient documentation

## 2019-11-18 DIAGNOSIS — A419 Sepsis, unspecified organism: Secondary | ICD-10-CM | POA: Insufficient documentation

## 2019-11-18 DIAGNOSIS — U071 COVID-19: Secondary | ICD-10-CM

## 2019-11-18 DIAGNOSIS — L8990 Pressure ulcer of unspecified site, unspecified stage: Secondary | ICD-10-CM | POA: Insufficient documentation

## 2019-11-18 DIAGNOSIS — R6521 Severe sepsis with septic shock: Secondary | ICD-10-CM

## 2019-11-18 LAB — I-STAT ARTERIAL BLOOD GAS, ED
Acid-Base Excess: 1 mmol/L (ref 0.0–2.0)
Acid-base deficit: 14 mmol/L — ABNORMAL HIGH (ref 0.0–2.0)
Bicarbonate: 24.8 mmol/L (ref 20.0–28.0)
Bicarbonate: 17.1 mmol/L — ABNORMAL LOW (ref 20.0–28.0)
Calcium, Ion: 1.18 mmol/L (ref 1.15–1.40)
Calcium, Ion: 1.11 mmol/L — ABNORMAL LOW (ref 1.15–1.40)
HCT: 41 % (ref 36.0–46.0)
HCT: 42 % (ref 36.0–46.0)
Hemoglobin: 13.9 g/dL (ref 12.0–15.0)
Hemoglobin: 14.3 g/dL (ref 12.0–15.0)
O2 Saturation: 86 %
O2 Saturation: 84 %
Patient temperature: 98.6
Patient temperature: 98.6
Potassium: 4.1 mmol/L (ref 3.5–5.1)
Potassium: 6.2 mmol/L — ABNORMAL HIGH (ref 3.5–5.1)
Sodium: 139 mmol/L (ref 135–145)
Sodium: 134 mmol/L — ABNORMAL LOW (ref 135–145)
TCO2: 26 mmol/L (ref 22–32)
TCO2: 19 mmol/L — ABNORMAL LOW (ref 22–32)
pCO2 arterial: 35.4 mmHg (ref 32.0–48.0)
pCO2 arterial: 62.1 mmHg — ABNORMAL HIGH (ref 32.0–48.0)
pH, Arterial: 7.454 — ABNORMAL HIGH (ref 7.350–7.450)
pH, Arterial: 7.049 — CL (ref 7.350–7.450)
pO2, Arterial: 49 mmHg — ABNORMAL LOW (ref 83.0–108.0)
pO2, Arterial: 70 mmHg — ABNORMAL LOW (ref 83.0–108.0)

## 2019-11-18 LAB — POCT I-STAT 7, (LYTES, BLD GAS, ICA,H+H)
Acid-base deficit: 4 mmol/L — ABNORMAL HIGH (ref 0.0–2.0)
Acid-base deficit: 9 mmol/L — ABNORMAL HIGH (ref 0.0–2.0)
Bicarbonate: 25.2 mmol/L (ref 20.0–28.0)
Bicarbonate: 20.1 mmol/L (ref 20.0–28.0)
Calcium, Ion: 1.06 mmol/L — ABNORMAL LOW (ref 1.15–1.40)
Calcium, Ion: 1 mmol/L — ABNORMAL LOW (ref 1.15–1.40)
HCT: 38 % (ref 36.0–46.0)
HCT: 37 % (ref 36.0–46.0)
Hemoglobin: 12.9 g/dL (ref 12.0–15.0)
Hemoglobin: 12.6 g/dL (ref 12.0–15.0)
O2 Saturation: 90 %
O2 Saturation: 91 %
Patient temperature: 38.4
Patient temperature: 38.6
Potassium: 5.2 mmol/L — ABNORMAL HIGH (ref 3.5–5.1)
Potassium: 5.5 mmol/L — ABNORMAL HIGH (ref 3.5–5.1)
Sodium: 145 mmol/L (ref 135–145)
Sodium: 145 mmol/L (ref 135–145)
TCO2: 27 mmol/L (ref 22–32)
TCO2: 22 mmol/L (ref 22–32)
pCO2 arterial: 71.4 mmHg (ref 32.0–48.0)
pCO2 arterial: 59.5 mmHg — ABNORMAL HIGH (ref 32.0–48.0)
pH, Arterial: 7.163 — CL (ref 7.350–7.450)
pH, Arterial: 7.145 — CL (ref 7.350–7.450)
pO2, Arterial: 82 mmHg — ABNORMAL LOW (ref 83.0–108.0)
pO2, Arterial: 88 mmHg (ref 83.0–108.0)

## 2019-11-18 LAB — CBC WITH DIFFERENTIAL/PLATELET
Abs Immature Granulocytes: 0.4 10*3/uL — ABNORMAL HIGH (ref 0.00–0.07)
Basophils Absolute: 0.1 10*3/uL (ref 0.0–0.1)
Basophils Relative: 0 %
Eosinophils Absolute: 0 10*3/uL (ref 0.0–0.5)
Eosinophils Relative: 0 %
HCT: 34.2 % — ABNORMAL LOW (ref 36.0–46.0)
Hemoglobin: 11.1 g/dL — ABNORMAL LOW (ref 12.0–15.0)
Immature Granulocytes: 2 %
Lymphocytes Relative: 3 %
Lymphs Abs: 0.7 10*3/uL (ref 0.7–4.0)
MCH: 30.8 pg (ref 26.0–34.0)
MCHC: 32.5 g/dL (ref 30.0–36.0)
MCV: 95 fL (ref 80.0–100.0)
Monocytes Absolute: 1.6 10*3/uL — ABNORMAL HIGH (ref 0.1–1.0)
Monocytes Relative: 6 %
Neutro Abs: 21.6 10*3/uL — ABNORMAL HIGH (ref 1.7–7.7)
Neutrophils Relative %: 89 %
Platelets: 524 10*3/uL — ABNORMAL HIGH (ref 150–400)
RBC: 3.6 MIL/uL — ABNORMAL LOW (ref 3.87–5.11)
RDW: 15.3 % (ref 11.5–15.5)
WBC: 24.3 10*3/uL — ABNORMAL HIGH (ref 4.0–10.5)
nRBC: 0 % (ref 0.0–0.2)

## 2019-11-18 LAB — COMPREHENSIVE METABOLIC PANEL
ALT: 26 U/L (ref 0–44)
AST: 33 U/L (ref 15–41)
Albumin: 2.4 g/dL — ABNORMAL LOW (ref 3.5–5.0)
Alkaline Phosphatase: 52 U/L (ref 38–126)
Anion gap: 11 (ref 5–15)
BUN: 17 mg/dL (ref 8–23)
CO2: 22 mmol/L (ref 22–32)
Calcium: 8.5 mg/dL — ABNORMAL LOW (ref 8.9–10.3)
Chloride: 104 mmol/L (ref 98–111)
Creatinine, Ser: 0.84 mg/dL (ref 0.44–1.00)
GFR calc Af Amer: >60 mL/min (ref >60)
GFR calc non Af Amer: >60 mL/min (ref >60)
Glucose, Bld: 223 mg/dL — ABNORMAL HIGH (ref 70–99)
Potassium: 4.5 mmol/L (ref 3.5–5.1)
Sodium: 137 mmol/L (ref 135–145)
Total Bilirubin: 0.9 mg/dL (ref 0.3–1.2)
Total Protein: 6 g/dL — ABNORMAL LOW (ref 6.5–8.1)

## 2019-11-18 LAB — GLUCOSE, CAPILLARY
Glucose-Capillary: 168 mg/dL — ABNORMAL HIGH (ref 70–99)
Glucose-Capillary: 78 mg/dL (ref 70–99)

## 2019-11-18 LAB — CBG MONITORING, ED
Glucose-Capillary: 195 mg/dL — ABNORMAL HIGH (ref 70–99)
Glucose-Capillary: 145 mg/dL — ABNORMAL HIGH (ref 70–99)

## 2019-11-18 LAB — C-REACTIVE PROTEIN: CRP: 12.7 mg/dL — ABNORMAL HIGH (ref <1.0)

## 2019-11-18 LAB — BRAIN NATRIURETIC PEPTIDE: B Natriuretic Peptide: 470.5 pg/mL — ABNORMAL HIGH (ref 0.0–100.0)

## 2019-11-18 LAB — D-DIMER, QUANTITATIVE: D-Dimer, Quant: 1.36 ug/mL-FEU — ABNORMAL HIGH (ref 0.00–0.50)

## 2019-11-18 LAB — TRIGLYCERIDES: Triglycerides: 104 mg/dL (ref <150)

## 2019-11-18 MED ORDER — ALBUTEROL SULFATE HFA 108 (90 BASE) MCG/ACT IN AERS: 4.0000 | INHALATION_SPRAY | Freq: Four times a day (QID) | RESPIRATORY_TRACT | Status: DC

## 2019-11-18 MED ORDER — SODIUM BICARBONATE 8.4 % IV SOLN: 100.0000 meq | Freq: Once | INTRAVENOUS | Status: AC

## 2019-11-18 MED ORDER — AMIODARONE HCL 200 MG PO TABS: 200.0000 mg | ORAL_TABLET | Freq: Every day | ORAL | Status: DC

## 2019-11-18 MED ORDER — CHLORHEXIDINE GLUCONATE 0.12% ORAL RINSE (MEDLINE KIT)
15.0000 mL | Freq: Two times a day (BID) | OROMUCOSAL | Status: DC
  Administered 2019-11-18: 15 mL via OROMUCOSAL

## 2019-11-18 MED ORDER — NOREPINEPHRINE 16 MG/250ML-% IV SOLN
0.0000 ug/min | INTRAVENOUS | Status: DC
  Administered 2019-11-18: 40 ug/min via INTRAVENOUS
  Administered 2019-11-19: 45 ug/min via INTRAVENOUS
  Filled 2019-11-18 (×2): qty 250

## 2019-11-18 MED ORDER — ASCORBIC ACID 500 MG PO TABS: 500.0000 mg | ORAL_TABLET | Freq: Every day | ORAL | Status: DC

## 2019-11-18 MED ORDER — INSULIN ASPART 100 UNIT/ML ~~LOC~~ SOLN: 0.0000 [IU] | SUBCUTANEOUS | Status: DC

## 2019-11-18 MED ORDER — CALCIUM GLUCONATE 10 % IV SOLN
1.0000 g | Freq: Once | INTRAVENOUS | Status: AC
  Administered 2019-11-18: 1 g via INTRAVENOUS
  Filled 2019-11-18: qty 10

## 2019-11-18 MED ORDER — ETOMIDATE 2 MG/ML IV SOLN
INTRAVENOUS | Status: AC | PRN
  Administered 2019-11-18: 20 mg via INTRAVENOUS

## 2019-11-18 MED ORDER — ZINC SULFATE 220 (50 ZN) MG PO CAPS: 220.0000 mg | ORAL_CAPSULE | Freq: Every day | ORAL | Status: DC

## 2019-11-18 MED ORDER — NOREPINEPHRINE 4 MG/250ML-% IV SOLN
0.0000 ug/min | INTRAVENOUS | Status: DC
  Administered 2019-11-18: 2 ug/min via INTRAVENOUS
  Administered 2019-11-18: 40 ug/min via INTRAVENOUS
  Filled 2019-11-18 (×3): qty 250

## 2019-11-18 MED ORDER — ROCURONIUM BROMIDE 50 MG/5ML IV SOLN
INTRAVENOUS | Status: AC | PRN
  Administered 2019-11-18: 100 mg via INTRAVENOUS

## 2019-11-18 MED ORDER — METHYLPREDNISOLONE SODIUM SUCC 40 MG IJ SOLR: 40.0000 mg | Freq: Every day | INTRAMUSCULAR | Status: DC

## 2019-11-18 MED ORDER — PROPOFOL 1000 MG/100ML IV EMUL
0.0000 ug/kg/min | INTRAVENOUS | Status: DC
  Administered 2019-11-18: 5 ug/kg/min via INTRAVENOUS
  Filled 2019-11-18: qty 100

## 2019-11-18 MED ORDER — ACETAMINOPHEN 325 MG PO TABS
650.0000 mg | ORAL_TABLET | Freq: Four times a day (QID) | ORAL | Status: DC | PRN
  Administered 2019-11-18: 650 mg
  Filled 2019-11-18 (×2): qty 2

## 2019-11-18 MED ORDER — FUROSEMIDE 10 MG/ML IJ SOLN
80.0000 mg | Freq: Once | INTRAMUSCULAR | Status: AC
  Administered 2019-11-18: 80 mg via INTRAVENOUS
  Filled 2019-11-18: qty 8

## 2019-11-18 MED ORDER — ALBUTEROL SULFATE HFA 108 (90 BASE) MCG/ACT IN AERS
2.0000 | INHALATION_SPRAY | Freq: Four times a day (QID) | RESPIRATORY_TRACT | Status: DC
  Filled 2019-11-18: qty 6.7

## 2019-11-18 MED ORDER — DOXYCYCLINE HYCLATE 100 MG PO TABS: 100.0000 mg | ORAL_TABLET | Freq: Two times a day (BID) | ORAL | Status: DC

## 2019-11-18 MED ORDER — INSULIN ASPART 100 UNIT/ML ~~LOC~~ SOLN
0.0000 [IU] | SUBCUTANEOUS | Status: DC
  Administered 2019-11-18: 3 [IU] via SUBCUTANEOUS

## 2019-11-18 MED ORDER — ATORVASTATIN CALCIUM 40 MG PO TABS: 40.0000 mg | ORAL_TABLET | Freq: Every day | ORAL | Status: DC

## 2019-11-18 MED ORDER — SODIUM BICARBONATE 8.4 % IV SOLN
INTRAVENOUS | Status: AC
  Administered 2019-11-18: 100 meq via INTRAVENOUS
  Filled 2019-11-18: qty 100

## 2019-11-18 MED ORDER — APIXABAN 5 MG PO TABS
5.0000 mg | ORAL_TABLET | Freq: Two times a day (BID) | ORAL | Status: DC
  Administered 2019-11-18: 5 mg
  Filled 2019-11-18 (×2): qty 1

## 2019-11-18 MED ORDER — VITAMIN D 25 MCG (1000 UNIT) PO TABS: 1000.0000 [IU] | ORAL_TABLET | Freq: Every day | ORAL | Status: DC

## 2019-11-18 MED ORDER — MONTELUKAST SODIUM 10 MG PO TABS: 10.0000 mg | ORAL_TABLET | Freq: Every day | ORAL | Status: DC

## 2019-11-18 MED ORDER — HYDROCODONE-HOMATROPINE 5-1.5 MG/5ML PO SYRP: 5.0000 mL | ORAL_SOLUTION | Freq: Two times a day (BID) | ORAL | Status: DC | PRN

## 2019-11-18 MED ORDER — ALBUMIN HUMAN 5 % IV SOLN
12.5000 g | Freq: Once | INTRAVENOUS | Status: AC
  Administered 2019-11-18: 12.5 g via INTRAVENOUS
  Filled 2019-11-18: qty 250

## 2019-11-18 MED ORDER — MIDODRINE HCL 5 MG PO TABS: 5.0000 mg | ORAL_TABLET | Freq: Three times a day (TID) | ORAL | Status: DC

## 2019-11-18 MED ORDER — MIDODRINE HCL 5 MG PO TABS
5.0000 mg | ORAL_TABLET | Freq: Three times a day (TID) | ORAL | Status: DC
  Administered 2019-11-18: 5 mg via ORAL
  Filled 2019-11-18: qty 1

## 2019-11-18 MED ORDER — CHLORHEXIDINE GLUCONATE CLOTH 2 % EX PADS: 6.0000 | MEDICATED_PAD | Freq: Every day | CUTANEOUS | Status: DC

## 2019-11-18 MED ORDER — PANTOPRAZOLE SODIUM 40 MG PO PACK: 40.0000 mg | PACK | Freq: Every day | ORAL | Status: DC

## 2019-11-18 MED ORDER — SODIUM CHLORIDE 0.9 % IV SOLN: 250.0000 mL | INTRAVENOUS | Status: DC

## 2019-11-18 MED ORDER — CALCIUM GLUCONATE-NACL 2-0.675 GM/100ML-% IV SOLN
2.0000 g | Freq: Once | INTRAVENOUS | Status: DC
  Filled 2019-11-18: qty 100

## 2019-11-18 MED ORDER — VASOPRESSIN 20 UNITS/100 ML INFUSION FOR SHOCK
0.0000 [IU]/min | INTRAVENOUS | Status: DC
  Administered 2019-11-18: 0.03 [IU]/min via INTRAVENOUS
  Administered 2019-11-19: 0.04 [IU]/min via INTRAVENOUS
  Filled 2019-11-18 (×2): qty 100

## 2019-11-18 MED ORDER — SODIUM BICARBONATE 8.4 % IV SOLN
100.0000 meq | Freq: Once | INTRAVENOUS | Status: AC
  Administered 2019-11-18: 100 meq via INTRAVENOUS
  Filled 2019-11-18: qty 150

## 2019-11-18 MED ORDER — METHYLPREDNISOLONE SODIUM SUCC 40 MG IJ SOLR
40.0000 mg | Freq: Two times a day (BID) | INTRAMUSCULAR | Status: DC
  Administered 2019-11-18: 40 mg via INTRAVENOUS
  Filled 2019-11-18: qty 1

## 2019-11-18 MED ORDER — ORAL CARE MOUTH RINSE
15.0000 mL | OROMUCOSAL | Status: DC
  Administered 2019-11-18 (×2): 15 mL via OROMUCOSAL

## 2019-11-18 MED ORDER — PHENYLEPHRINE 40 MCG/ML (10ML) SYRINGE FOR IV PUSH (FOR BLOOD PRESSURE SUPPORT)
PREFILLED_SYRINGE | INTRAVENOUS | Status: AC
  Administered 2019-11-18: 40 ug
  Filled 2019-11-18: qty 10

## 2019-11-18 MED FILL — Medication: Qty: 1 | Status: AC

## 2019-11-18 NOTE — Progress Notes (Signed)
Called daughter for consent for central line.  Patient arrived to ICU with hypotension to the 51'T systolic.  During call, daughter consented for line and discussed goals of care with daughter.  [name] indicates that she wants to continue current support but in the event of arrest, no CPR.  She expresses that she would want to make sure her mother is comfortable during the process.  Will continue to support patient, place central line.  Support offered to the daughter.    Will follow.    Noe Gens, MSN, NP-C [facility] 11/18/2019, 3:57 PM   Please see [email or web address] for pager details.

## 2019-11-18 NOTE — Procedures (Signed)
Interosseous catheter insertion procedure Note  Tanya [name]  [id number]  [birth year]  Date:11/18/19  Time:2:38 PM   Provider Performing:Andrw Mcguirt   Procedure: Insertion of intraosseous catheter in right lower leg  Indication(s) Medication administration  Consent Unable to obtain consent due to emergent nature of procedure.  Anesthesia Topical only with 1% lidocaine   Timeout Verified patient identification, verified procedure, site/side was marked, verified correct patient position, special equipment/implants available, medications/allergies/relevant history reviewed, required imaging and test results available.  Sterile Technique Maximal sterile technique including full sterile barrier drape, hand hygiene, sterile gown, sterile gloves, mask, hair covering, sterile ultrasound probe cover (if used).  Procedure Description Area of catheter insertion was clean with chlorhexidine and draped in sterile fashion.  Intraosseous catheter was placed in right lower leg was placed.  Position was confirmed with marrow aspiration and free-flowing IV fluid   Complications/Tolerance None; patient tolerated the procedure well.   EBL Minimal  Specimen(s) None

## 2019-11-18 NOTE — ED Notes (Signed)
Daughter updated. Request further update later.

## 2019-11-18 NOTE — ED Notes (Signed)
Per care handoff, pt is allowed to range between 80% to 100% O2 sat.

## 2019-11-18 NOTE — Progress Notes (Signed)
NAME:  Tanya [name], MRN:  [id number], DOB:  [birth year], LOS: 2 ADMISSION DATE:  11/24/2019, CONSULTATION DATE:  11/17/19 REFERRING MD:  [place] Crane - TR[facility], CHIEF COMPLAINT:  Hypotension : SOB  Brief History    74 yo F admitted with suspected HCAP following recent hospitalization for COVID-19 PNA, now with worsening hypoxia. Became hypotensive in ED and was started on pressors. Patient condition got worse overnight, started requiring high flow oxygen which she could not tolerate and then she was put on BiPAP, she was tiring up and intubated   Past Medical History  Breast cancer Ovarian cancer DM DVT GERD Gout S/p bilateral TKR S/p TSR Septic arthritis, now on doxy HTN  OSA on CPAP Severe AS s/p TAVR   Significant Hospital Events   9/8 presents to ED with suspected HCAP. Admitted to [facility]. Became hypotensive while receiving vanc, started on pressors. PCCM consulted  9/10 intubated  Consults:  PCCM  Procedures:  9/10 ETT  Significant Diagnostic Tests:  9/8 CXR> bibasilar opacities  9/10 x-ray chest > worsening bilateral infiltrates  Micro Data:  8/30 COVID-19 positive 9/8 sputum>  9/8 BCX>   Antimicrobials:  9/8 vanc>  9/8 cefepime>  9/8 doxy >> (home med for septic arthritis)   Interim history/subjective:  Patient is started getting worse, requiring high flow oxygen which she could not tolerate because of hypoxemia and had increased work of breathing, she was placed on BiPAP with slight improvement initially, but later on she said she is getting tired and unable to breathe, her O2 sat dropped to 70s, decision was made to intubate and placed on mechanical ventilation.  Objective   Blood pressure (!) 99/54, pulse 84, temperature (!) 101.1 F (38.4 C), resp. rate (!) 0, height 4\' 8"  (1.422 m), weight 80 kg, SpO2 (!) 79 %.    Vent Mode: PRVC FiO2 (%):  [100 %] 100 % Set Rate:  [16 bmp-18 bmp] 18 bmp Vt Set:  [400 mL-440 mL] 400 mL PEEP:  [16 cmH20] 16 cmH20 Plateau  Pressure:  [38 cmH20] 38 cmH20   Intake/Output Summary (Last 24 hours) at 11/18/2019 1444 Last data filed at 11/18/2019 1433 Gross per 24 hour  Intake 359.2 ml  Output --  Net 359.2 ml   Filed Weights   11/18/2019 1742  Weight: 80 kg    Examination:   Physical exam: General: Chronically ill-appearing morbidly obese female, orally intubated HEAENT: [place]/AT, eyes anicteric.  ETT and OGT in place Neuro: Sedated, not following commands.  Eyes are closed.  Pupils 3 mm bilateral reactive to light Chest: Coarse crackles bilaterally at bases and rhonchi, no wheezes or rhonchi Heart: Irregularly irregular Abdomen: Soft, nontender, nondistended, bowel sounds present Skin: No rash   Assessment & Plan:   Acute on chronic hypoxic respiratory failure Severe sepsis with septic shock due due to bibasilar pneumonia ARDS COVID-19 pneumonia Acute diastolic congestive heart failure Lactic acidosis Acute kidney injury Morbid obesity OSA on CPAP Acute metabolic/toxic encephalopathy Atrial fibrillation with rapid ventricular response Septic arthritis, diagnosed as an outpatient on antibiotic therapy  Patient respiratory status got worse, requiring BiPAP all night, this morning started getting tired up and became hypoxic to 70s, after long discussion with her, decision was made to proceed with endotracheal intubation and mechanical ventilation Even after intubation and she was placed on mechanical ventilation patient O2 sat remain in 70s 80 mg of IV Lasix given x1 Continue broad-spectrum antibiotics Patient was tested positive for COVID-19 viral PNA on 8/30, s/p course of  steroids/remdesivir/baricitinib/diuresis Continue steroids for now. Continue contact/airborne precautions. Cont abx, follow up sputum cx. Patient was started on Levophed post intubation as her map was in 68s, currently she is on 20 mics of levo Titrate vasopressor with map goal > 65 Monitor serum creatinine, intake output and  daily weight Patient history of A. fib, currently in RVR due to septic shock and being on vasopressors She may need to be started on amiodarone infusion Continue Eliquis for now. Hx septic arthritis continue suppressive doxycycline.   Best practice:  Diet: Heart healthy.  NPO Pain/Anxiety/Delirium protocol (if indicated): Propofol VAP protocol (if indicated): Ordered DVT prophylaxis: eliquis GI prophylaxis: protonix Glucose control: SSI Mobility: Bedrest Code Status: Full Family Communication: Try to call patient daughter, could not reach out.  Left voicemail Disposition: ICU    Total critical care time: 70 minutes  Performed by: Jacky Kindle   Critical care time was exclusive of separately billable procedures and treating other patients.   Critical care was necessary to treat or prevent imminent or life-threatening deterioration.   Critical care was time spent personally by me on the following activities: development of treatment plan with patient and/or surrogate as well as nursing, discussions with consultants, evaluation of patient's response to treatment, examination of patient, obtaining history from patient or surrogate, ordering and performing treatments and interventions, ordering and review of laboratory studies, ordering and review of radiographic studies, pulse oximetry and re-evaluation of patient's condition.   Jacky Kindle MD Critical care physician [facility]  Pager: [phone] Mobile: [phone]

## 2019-11-18 NOTE — ED Notes (Signed)
This tech provided patient with water. When the pt. CPAP was removed Oxygen Saturation dropped to 79%. This tech put the mask back on and provided barrier cream for complaints of dry lips d/t CPAP mask.

## 2019-11-18 NOTE — Progress Notes (Signed)
PROGRESS NOTE                                                                                                                                                                                                             Patient Demographics:    Tanya [name], is a 74 y.o. female, DOB - Jun 30, 1945, QIH:[id number]  Outpatient Primary MD for the patient is Zara Chess, NP   Admit date - 11/21/2019   LOS - 2  Chief Complaint  Patient presents with  . Shortness of Breath       Brief Narrative: Patient is a 74 y.o. female with PMHx of DM-2, HTN, chronic diastolic heart failure, PAF on Eliquis, COPD, OSA on CPAP, chronic hypoxic respiratory failure on mostly 2 L of oxygen at night, severe AS-s/p TAVR 2020, history of ovarian cancer s/p chemo and radical hysterectomy, history of septic arthritis on suppressive doxycycline-recently hospitalized at this facility from 8/30-9/3-for acute on chronic hypoxic/hypercarbic respiratory failure felt to be multifactorial etiology from COPD, OSA, CHF, Covid pneumonia-and discharged home on 2 L of oxygen-presented to the hospital on 9/8 with worsening shortness of breath and septic shock due to healthcare associated pneumonia.  See below for further details  COVID-19 vaccinated status: Unvaccinated  Significant Events: 8/30-9/3>> admit to [facility] for acute on chronic hypercarbic/hypoxic respiratory failure due to decompensated OSA/COPD exacerbation, diastolic heart failure and COVID-19 pneumonia.  Discharged home on 2 L. 9/8>> presented to [facility] for worsening shortness of breath-thought to be secondary to bacterial pneumonia with septic shock requiring pressors. 9/9>> quickly weaned off pressors-and transferred to [facility].  9/10>> worsening hypoxemia-unable to wean off CPAP-on 15 L of HFNC-chest x-ray with worsening multifocal infiltrates.  PCCM reconsulted.   Significant studies: 9/10>>Chest x-ray:  Widespread airspace opacity bilaterally-significant change from just 2 days prior. 9/8>> chest x-ray: Marked improvement in the interstitial and groundglass opacity seen previously  COVID-19 medications: Steroids: 9/8>> Remdesivir: 8/30>>9/3 Baricitinib: 8/31>>9/3  Antibiotics: Vancomycin: 9/8>> Cefepime: 9/8>> Continued on suppressive doxycycline (takes chronically)  Microbiology data: 9/9 >>blood culture: No growth 9/9>> sputum culture: Pending  Procedures: None  Consults: PCCM  DVT prophylaxis: apixaban (ELIQUIS) tablet 5 mg     Subjective:    Nidhi Gronewold today claims she feels more short of breath today than yesterday.  Nursing staff attempted to take her off CPAP x2 this morning-but  desaturates significantly in gets in acute distress.  Subsequent chest x-ray done this morning shows significant worsening of the infiltrates.    Assessment  & Plan :   Acute Hypoxic Resp Failure due HCAP-possible decompensated diastolic heart failure: Seems to have deteriorated overnight-more hypoxic-unable to come off CPAP this morning-also on 15 L of HFNC on top of CPAP.  Chest x-ray this morning shows significant worsening of existing infiltrates-was hypotensive yesterday and probably did not get diuretics.  Blood pressure somewhat soft-given midodrine-and subsequently will be challenged with IV Lasix.  Remains on broad-spectrum IV antibiotics-awaiting further culture data.  PCCM reconsulted-we will place on BiPAP-and await further recommendations from PCCM.  COVID-19 pneumonia: Treated with Remdesivir/baricitinib-and discharged with steroid during her most recent hospitalization-suspect current issues are more related to bacterial pneumonia, some inflammation related to COVID and CHF.  Remains on IV Solu-Medrol and other supportive care.  Fever: afebrile O2 requirements:  SpO2: 100 % O2 Flow Rate (L/min): 8 L/min   COVID-19 Labs: Recent Labs    12/08/2019 1815 11/18/19 0904  DDIMER  0.55* 1.36*  CRP  --  12.7*       Component Value Date/Time   BNP 470.5 (H) 11/18/2019 0904    Recent Labs  Lab 11/17/19 0500  PROCALCITON <0.10    Lab Results  Component Value Date   SARSCOV2NAA POSITIVE (A) 11/07/2019   [place] NEGATIVE 09/03/2019   [place] NEGATIVE 06/22/2018     Septic shock: Secondary to HCAP-resolved-but blood pressure still soft-we will place on midodrine.  Acute on chronic diastolic heart failure: Does not have significant amount of peripheral edema-however significant worsening of her hypoxemia with new infiltrates on chest x-ray-concerning that she may have pulmonary edema-placed on BiPAP-ordering IV Lasix to see if this would improve her hypoxia.    COPD: Some scattered rhonchi but does not appear to be in exacerbation-in any event on steroids and bronchodilators.    PAF: Stable-rate controlled with amiodarone-anticoagulated with Eliquis  History of severe AS-s/p TAVR procedure 2020  HTN:BP stable-but soft-was hypotensive yesterday-all antihypertensives on hold.  HUD:JSHFWYOV statin  DM-2 (A1c 7.4 on 09/03/2019):CBG relatively stable-continue with SSI-suspect that once she comes off BiPAP-and starts eating-we will require initiation of Levemir.  Watch closely.     Recent Labs    11/17/19 1747 11/17/19 2157 11/18/19 0748  GLUCAP 116* 182* 195*    OSA with chronic hypoxic respiratory failure (2 L into CPAP): Uses CPAP nightly-currently on CPAP-being transitioned to BiPAP given significant worsening of her hypoxemia  Deconditioning/debility:We will require PT evaluation at some point when she is clinically improved.  History of septic arthritis right shoulder/knee on chronic doxycycline therapy (after right shoulder replacement in 2017)  Obesity: Estimated body mass index is 39.54 kg/m as calculated from the following:   Height as of this encounter: 4\' 8"  (1.422 m).   Weight as of this encounter: 80 kg.     GI  prophylaxis: PPI  ABG:    Component Value Date/Time   PHART 7.348 (L) 11/07/2019 2118   PCO2ART 47.5 11/07/2019 2118   PO2ART 118 (H) 11/07/2019 2118   HCO3 25.7 11/07/2019 2118   TCO2 27 11/07/2019 2118   ACIDBASEDEF 4.0 (H) 11/07/2019 1857   O2SAT 98.0 11/07/2019 2118    Vent Settings: N/A  Condition - Extremely Guarded  Family Communication  : Sharon-daughter over the phone on 9/10  Code Status :  Full Code  Diet :  Diet Order  Diet NPO time specified  Diet effective now                  Disposition Plan  :   Status is: Inpatient  Remains inpatient appropriate because:Inpatient level of care appropriate due to severity of illness   Dispo:  Patient From: Home  Planned Disposition: Home  Expected discharge date: > 3 days  Medically stable for discharge: No     Barriers to discharge: Hypoxia requiring O2 supplementation/complete 5 days of IV Remdesivir  Antimicorbials  :    Anti-infectives (From admission, onward)   Start     Dose/Rate Route Frequency Ordered Stop   11/17/19 2000  vancomycin (VANCOCIN) IVPB 1000 mg/200 mL premix  Status:  Discontinued        1,000 mg 200 mL/hr over 60 Minutes Intravenous Every 24 hours 12/07/2019 2201 11/17/19 0923   11/17/19 1100  vancomycin (VANCOREADY) IVPB 750 mg/150 mL        750 mg 150 mL/hr over 60 Minutes Intravenous Every 12 hours 11/17/19 0923     11/17/19 0630  ceFEPIme (MAXIPIME) 2 g in sodium chloride 0.9 % 100 mL IVPB        2 g 200 mL/hr over 30 Minutes Intravenous Every 12 hours 11/14/2019 1952 11/22/19 0629   12/04/2019 2200  doxycycline (VIBRA-TABS) tablet 100 mg        100 mg Oral 2 times daily 11/18/2019 2013     11/11/2019 1815  vancomycin (VANCOREADY) IVPB 1750 mg/350 mL        1,750 mg 175 mL/hr over 120 Minutes Intravenous  Once 11/29/2019 1803 11/29/2019 2225   11/10/2019 1800  ceFEPIme (MAXIPIME) 2 g in sodium chloride 0.9 % 100 mL IVPB        2 g 200 mL/hr over 30 Minutes Intravenous  Once  11/29/2019 1754 11/10/2019 1940      Inpatient Medications  Scheduled Meds: . albuterol  2 puff Inhalation Q6H  . amiodarone  200 mg Oral Daily  . apixaban  5 mg Oral BID  . vitamin C  500 mg Oral Daily  . atorvastatin  40 mg Oral Daily  . cholecalciferol  1,000 Units Oral Daily  . doxycycline  100 mg Oral BID  . DULoxetine  30 mg Oral BID  . furosemide  80 mg Intravenous Once  . insulin aspart  0-5 Units Subcutaneous QHS  . insulin aspart  0-9 Units Subcutaneous TID WC  . ipratropium  2 puff Inhalation Q6H  . [START ON 12/14/2019] methylPREDNISolone (SOLU-MEDROL) injection  40 mg Intravenous Daily  . midodrine  5 mg Oral TID WC  . montelukast  10 mg Oral QHS  . pantoprazole  40 mg Oral Daily  . potassium chloride  10 mEq Oral Daily  . zinc sulfate  220 mg Oral Daily   Continuous Infusions: . ceFEPime (MAXIPIME) IV Stopped (11/18/19 0908)  . lactated ringers 10 mL/hr at 11/18/19 1014  . vancomycin Stopped (11/18/19 0028)   PRN Meds:.acetaminophen, albuterol, HYDROcodone-homatropine   Time Spent in minutes  45   The patient is critically ill with multiple organ system failure and requires high complexity decision making for assessment and support, frequent evaluation and titration of therapies, advanced monitoring, review of radiographic studies and interpretation of complex data.   See all Orders from today for further details   Oren Binet M.D on 11/18/2019 at 11:21 AM  To page go to [email or web address] - use universal password  [facility] -  Office  [phone]    Objective:   Vitals:   11/18/19 0800 11/18/19 0930 11/18/19 1020 11/18/19 1118  BP: (!) 120/59 116/82 (!) 118/58   Pulse: 81 (!) 38 77 87  Resp: (!) 29 (!) 39 (!) 25 (!) 34  Temp:      TempSrc:      SpO2: (!) 89% 100% (!) 88% 100%  Weight:      Height:        Wt Readings from Last 3 Encounters:  11/17/2019 80 kg  11/11/19 80.7 kg  10/20/19 80.5 kg     Intake/Output Summary (Last 24 hours)  at 11/18/2019 1121 Last data filed at 11/18/2019 0028 Gross per 24 hour  Intake 626.78 ml  Output --  Net 626.78 ml     Physical Exam Gen Exam:Alert awake-not in any distress HEENT:atraumatic, normocephalic Chest: Some scattered rhonchi-few bibasilar rales. CVS:S1S2 regular Abdomen:soft non tender, non distended Extremities: Trace edema Neurology: Non focal Skin: no rash   Data Review:    CBC Recent Labs  Lab 11/11/2019 1815 11/17/19 0500 11/18/19 0510  WBC 18.8* 16.3* 24.3*  HGB 12.7 10.3* 11.1*  HCT 38.6 31.7* 34.2*  PLT 621* 563* 524*  MCV 94.4 94.9 95.0  MCH 31.1 30.8 30.8  MCHC 32.9 32.5 32.5  RDW 15.2 15.0 15.3  LYMPHSABS 0.6* 1.1 0.7  MONOABS 0.6 1.2* 1.6*  EOSABS 0.0 0.0 0.0  BASOSABS 0.1 0.1 0.1    Chemistries  Recent Labs  Lab 11/27/2019 1815 11/17/19 0500 11/18/19 0510  NA 135 139 137  K 4.9 4.1 4.5  CL 96* 105 104  CO2 22 20* 22  GLUCOSE 197* 153* 223*  BUN 33* 27* 17  CREATININE 1.21* 0.86 0.84  CALCIUM 9.1 8.0* 8.5*  AST 45* 23 33  ALT 32 24 26  ALKPHOS 59 46 52  BILITOT 1.5* 0.5 0.9   ------------------------------------------------------------------------------------------------------------------ No results for input(s): CHOL, HDL, LDLCALC, TRIG, CHOLHDL, LDLDIRECT in the last 72 hours.  Lab Results  Component Value Date   HGBA1C 6.4 (H) 11/17/2019   ------------------------------------------------------------------------------------------------------------------ No results for input(s): TSH, T4TOTAL, T3FREE, THYROIDAB in the last 72 hours.  Invalid input(s): FREET3 ------------------------------------------------------------------------------------------------------------------ No results for input(s): VITAMINB12, FOLATE, FERRITIN, TIBC, IRON, RETICCTPCT in the last 72 hours.  Coagulation profile No results for input(s): INR, PROTIME in the last 168 hours.  Recent Labs    12/02/2019 1815 11/18/19 0904  DDIMER 0.55* 1.36*     Cardiac Enzymes No results for input(s): CKMB, TROPONINI, MYOGLOBIN in the last 168 hours.  Invalid input(s): CK ------------------------------------------------------------------------------------------------------------------    Component Value Date/Time   BNP 470.5 (H) 11/18/2019 0737    Micro Results Recent Results (from the past 240 hour(s))  Culture, blood (routine x 2)     Status: None (Preliminary result)   Collection Time: 11/17/19  7:56 AM   Specimen: BLOOD RIGHT FOREARM  Result Value Ref Range Status   Specimen Description BLOOD RIGHT FOREARM  Final   Special Requests   Final    BOTTLES DRAWN AEROBIC AND ANAEROBIC Blood Culture results may not be optimal due to an inadequate volume of blood received in culture bottles   Culture   Final    NO GROWTH < 24 HOURS Performed at [facility], [place] [address] [place], [place] [address]    Report Status PENDING  Incomplete  Expectorated sputum assessment w rflx to resp cult     Status: None   Collection Time: 11/17/19 11:48 AM   Specimen: Expectorated Sputum  Result Value Ref Range Status   Specimen Description EXPECTORATED SPUTUM  Final   Special Requests NONE  Final   Sputum evaluation   Final    THIS SPECIMEN IS ACCEPTABLE FOR SPUTUM CULTURE Performed at [facility], [address], [place], [place] [address]    Report Status 11/17/2019 FINAL  Final  Culture, respiratory     Status: None (Preliminary result)   Collection Time: 11/17/19 11:48 AM  Result Value Ref Range Status   Specimen Description EXPECTORATED SPUTUM  Final   Special Requests NONE Reflexed from [id number]  Final   Gram Stain   Final    ABUNDANT WBC PRESENT, PREDOMINANTLY MONONUCLEAR MODERATE GRAM NEGATIVE RODS FEW GRAM POSITIVE COCCI RARE GRAM NEGATIVE COCCI    Culture   Final    CULTURE REINCUBATED FOR BETTER GROWTH Performed at [facility], [place] [address], [place], [place] [address]    Report Status PENDING  Incomplete   MRSA PCR Screening     Status: None   Collection Time: 11/17/19  1:02 PM   Specimen: Nasopharyngeal  Result Value Ref Range Status   MRSA by PCR NEGATIVE NEGATIVE Final    Comment:        The GeneXpert MRSA Assay (FDA approved for NASAL specimens only), is one component of a comprehensive MRSA colonization surveillance program. It is not intended to diagnose MRSA infection nor to guide or monitor treatment for MRSA infections. Performed at [facility], [place] [address], [place],  [address]     Radiology Reports DG Chest 2 View  Result Date: 12/04/2019 CLINICAL DATA:  Shortness of breath, history of COVID EXAM: CHEST - 2 VIEW COMPARISON:  06/07/2019 FINDINGS: Frontal and lateral views of the chest demonstrate an unremarkable cardiac silhouette. Stable postsurgical changes from aortic valve replacement. Persistent interstitial prominence throughout the lungs, with minimal residual bilateral ground-glass airspace disease. Overall significant improvement since prior exam. No effusion or pneumothorax. IMPRESSION: 1. Marked improvement in the interstitial and ground-glass opacity seen previously, consistent with improving pneumonia. Due to Electronically Signed   By: Randa Ngo M.D.   On: 12/08/2019 18:16   DG Chest Port 1 View  Result Date: 11/07/2019 CLINICAL DATA:  Sudden onset respiratory distress, COVID-19 EXAM: PORTABLE CHEST 1 VIEW COMPARISON:  Radiograph 06/29/2018, CT 07/22/2019 FINDINGS: Widespread heterogeneous airspace opacities are present in both lungs with a basilar predominance as well as cephalized indistinct pulmonary vascularity. Small left effusion. No right effusion. No visible pneumothorax. Cardiomegaly, similar to priors. Evidence of prior TAVR. The aorta is calcified. The remaining cardiomediastinal contours are unremarkable. No acute osseous or soft tissue abnormality. Prior reverse left shoulder arthroplasty. Telemetry leads overlie the chest.  IMPRESSION: Widespread heterogeneous airspace opacities in both lungs with a basilar predominance as well as cephalized pulmonary vascularity, could reflect pulmonary edema and/or multifocal pneumonia in the setting of COVID-19. Aortic Atherosclerosis (ICD10-I70.0). Electronically Signed   By: Lovena Le M.D.   On: 11/07/2019 19:34   DG Chest Port 1V same Day  Result Date: 11/18/2019 CLINICAL DATA:  Shortness of breath EXAM: PORTABLE CHEST 1 VIEW COMPARISON:  November 16, 2019 FINDINGS: There is multifocal airspace opacity bilaterally, representing a significant change from 2 days prior. Heart is upper normal in size with pulmonary vascularity normal. No adenopathy. Patient is status post aortic valve replacement. No adenopathy appreciable by radiography. Total shoulder replacement noted on the left. IMPRESSION: Widespread airspace opacity bilaterally, a significant change from 2 days prior. Suspect multifocal atypical  organism pneumonia. Stable cardiac silhouette. Status post aortic valve replacement. Electronically Signed   By: Lowella Grip III M.D.   On: 11/18/2019 09:06

## 2019-11-18 NOTE — Progress Notes (Signed)
PCCM Interval Progress Note  Chart reviewed.  Pt has remained off vasopressors since yesterday mid afternoon.  Remains stable.  Vitals stable.   Continue current plan / management per TR[facility]. Please call PCCM back if needs arise.   Montey Hora, [facility] 11/18/2019, 7:23 AM

## 2019-11-18 NOTE — Procedures (Signed)
Arterial Catheter Insertion Procedure Note  Clancy Leiner [place]  [id number]  03/15/1945  Date:11/18/19  Time:5:40 PM    Provider Performing: Jacky Kindle    Procedure: Insertion of Arterial Line [phone]) with US guidance ([id number])   Indication(s) Blood pressure monitoring and/or need for frequent ABGs  Consent Risks of the procedure as well as the alternatives and risks of each were explained to the patient and/or caregiver.  Consent for the procedure was obtained and is signed in the bedside chart  Anesthesia None   Time Out Verified patient identification, verified procedure, site/side was marked, verified correct patient position, special equipment/implants available, medications/allergies/relevant history reviewed, required imaging and test results available.   Sterile Technique Maximal sterile technique including full sterile barrier drape, hand hygiene, sterile gown, sterile gloves, mask, hair covering, sterile ultrasound probe cover (if used).   Procedure Description Area of catheter insertion was cleaned with chlorhexidine and draped in sterile fashion. With real-time ultrasound guidance an arterial catheter was placed into the right Axillary artery.  Appropriate arterial tracings confirmed on monitor.     Complications/Tolerance None; patient tolerated the procedure well.   EBL Minimal   Specimen(s) None

## 2019-11-18 NOTE — ED Notes (Signed)
Removal of cpap per Dr. discussed with pt.  Pt was uncomfortable removing the mask at this time.  Pt states that she wears it during the day sometimes also if she is having a hard time breathing.  Pt requested NRB if cpap is to be removed.

## 2019-11-18 NOTE — Progress Notes (Addendum)
eLink Physician-Brief Progress Note Patient Name: Tanya [name] DOB: 12/08/1945 MRN: [id number]   Date of Service  11/18/2019  HPI/Events of Note  Patient admitted with respiratory failure from Covid pneumonia, she's on the ventilator, levophed, and Vasopressin with a soft blood pressure, lactic acid  3.4. Albumin 2.4. Respiratory acidosis. Ca++ 1.0.  eICU Interventions  Albumin 5 % 250 ml iv bolus x 1. Increase respiratory rate to 35. Calcium gluconate 2 gm iv x 1.        Tanya [name] 11/18/2019, 11:27 PM

## 2019-11-18 NOTE — ED Notes (Signed)
Partial linen change and brief changed. Peri care provided. New purewick placed. Pt appears to have excoriated and red painful skin to inner labia. Purewick placed against skin outside of labia.

## 2019-11-18 NOTE — Procedures (Signed)
Intubation Procedure Note  Tanya [name]  [id number]  Jun 18, 1945  Date:11/18/19  Time:2:37 PM   Provider Performing:Mahreen Schewe    Procedure: Intubation (31500)  Indication(s) Respiratory Failure  Consent Risks of the procedure as well as the alternatives and risks of each were explained to the patient and/or caregiver.  Consent for the procedure was obtained and is signed in the bedside chart   Anesthesia Etomidate and Rocuronium   Time Out Verified patient identification, verified procedure, site/side was marked, verified correct patient position, special equipment/implants available, medications/allergies/relevant history reviewed, required imaging and test results available.   Sterile Technique Usual hand hygeine, masks, and gloves were used   Procedure Description Patient positioned in bed supine.  Sedation given as noted above.  Patient was intubated with endotracheal tube using Glidescope.  View was Grade 1 full glottis .  Number of attempts was 1.  Colorimetric CO2 detector was consistent with tracheal placement.   Complications/Tolerance None; patient tolerated the procedure well. Chest X-ray is ordered to verify placement.   EBL Minimal   Specimen(s) None

## 2019-11-18 NOTE — Procedures (Signed)
Central Venous Catheter Insertion Procedure Note  Tanya [name]  [id number]  1946-02-27  Date:11/18/19  Time:4:32 PM   Provider Performing:Jaziel Bennett E Lisia [place]   Procedure: Insertion of Non-tunneled Central Venous [phone]) with US guidance ([id number])   Indication(s) Medication administration  Consent Risks of the procedure as well as the alternatives and risks of each were explained to the patient and/or caregiver.  Consent for the procedure was obtained and is signed in the bedside chart  Anesthesia continuous propofol  Timeout Verified patient identification, verified procedure, site/side was marked, verified correct patient position, special equipment/implants available, medications/allergies/relevant history reviewed, required imaging and test results available.  Sterile Technique Maximal sterile technique including full sterile barrier drape, hand hygiene, sterile gown, sterile gloves, mask, hair covering, sterile ultrasound probe cover (if used).  Procedure Description Area of catheter insertion was cleaned with chlorhexidine and draped in sterile fashion.  With real-time ultrasound guidance a central venous catheter was placed into the right internal jugular vein. Nonpulsatile blood flow and easy flushing noted in all ports.  The catheter was sutured in place and sterile dressing applied.  Complications/Tolerance None; patient tolerated the procedure well. Chest X-ray is ordered to verify placement for internal jugular or subclavian cannulation.   Chest x-ray is not ordered for femoral cannulation.  EBL Minimal  Specimen(s) None   Tanya Gum MSN, AGACNP-BC [place] [phone] If no answer, [phone] 11/18/2019, 4:33 PM

## 2019-11-18 NOTE — Progress Notes (Signed)
I was called by ED RN, patient remained hypoxic into 70s even after endotracheal intubation.  X-ray chest was reviewed, it shows right mainstem intubation, respiratory therapist was called and ET tube was pulled out 2 cm to the level of 21.  Repeat x-ray chest is ordered patient continued to remain hypoxic, she was given 80 mg of Lasix. Continue broad-spectrum antibiotic and vasopressors for septic shock to maintain map goal > 65. I adjusted ventilatory setting, now her oxygen saturation is started coming up to 85%, continue with 100% FiO2 and 15 of PEEP.   Additional critical care time: 35 minutes  Performed by: [place] care time was exclusive of separately billable procedures and treating other patients.   Critical care was necessary to treat or prevent imminent or life-threatening deterioration.   Critical care was time spent personally by me on the following activities: development of treatment plan with patient and/or surrogate as well as nursing, discussions with consultants, evaluation of patient's response to treatment, examination of patient, obtaining history from patient or surrogate, ordering and performing treatments and interventions, ordering and review of laboratory studies, ordering and review of radiographic studies, pulse oximetry and re-evaluation of patient's condition.   Jacky Kindle MD Critical care physician [facility]  Pager: [phone] Mobile: [phone]

## 2019-11-19 LAB — COMPREHENSIVE METABOLIC PANEL
ALT: 3752 U/L — ABNORMAL HIGH (ref 0–44)
AST: 7394 U/L — ABNORMAL HIGH (ref 15–41)
Albumin: 2.3 g/dL — ABNORMAL LOW (ref 3.5–5.0)
Alkaline Phosphatase: 75 U/L (ref 38–126)
Anion gap: 26 — ABNORMAL HIGH (ref 5–15)
BUN: 31 mg/dL — ABNORMAL HIGH (ref 8–23)
CO2: 14 mmol/L — ABNORMAL LOW (ref 22–32)
Calcium: 7.7 mg/dL — ABNORMAL LOW (ref 8.9–10.3)
Chloride: 107 mmol/L (ref 98–111)
Creatinine, Ser: 2.93 mg/dL — ABNORMAL HIGH (ref 0.44–1.00)
GFR calc Af Amer: 17 mL/min — ABNORMAL LOW (ref >60)
GFR calc non Af Amer: 15 mL/min — ABNORMAL LOW (ref >60)
Glucose, Bld: 89 mg/dL (ref 70–99)
Potassium: 6 mmol/L — ABNORMAL HIGH (ref 3.5–5.1)
Sodium: 147 mmol/L — ABNORMAL HIGH (ref 135–145)
Total Bilirubin: 2.3 mg/dL — ABNORMAL HIGH (ref 0.3–1.2)
Total Protein: 5.5 g/dL — ABNORMAL LOW (ref 6.5–8.1)

## 2019-11-19 LAB — CBC WITH DIFFERENTIAL/PLATELET
Abs Immature Granulocytes: 1.69 10*3/uL — ABNORMAL HIGH (ref 0.00–0.07)
Basophils Absolute: 0.3 10*3/uL — ABNORMAL HIGH (ref 0.0–0.1)
Basophils Relative: 1 %
Eosinophils Absolute: 0 10*3/uL (ref 0.0–0.5)
Eosinophils Relative: 0 %
HCT: 37.5 % (ref 36.0–46.0)
Hemoglobin: 11.4 g/dL — ABNORMAL LOW (ref 12.0–15.0)
Immature Granulocytes: 5 %
Lymphocytes Relative: 5 %
Lymphs Abs: 1.5 10*3/uL (ref 0.7–4.0)
MCH: 31.2 pg (ref 26.0–34.0)
MCHC: 30.4 g/dL (ref 30.0–36.0)
MCV: 102.7 fL — ABNORMAL HIGH (ref 80.0–100.0)
Monocytes Absolute: 2.1 10*3/uL — ABNORMAL HIGH (ref 0.1–1.0)
Monocytes Relative: 6 %
Neutro Abs: 27.4 10*3/uL — ABNORMAL HIGH (ref 1.7–7.7)
Neutrophils Relative %: 83 %
Platelets: 521 10*3/uL — ABNORMAL HIGH (ref 150–400)
RBC: 3.65 MIL/uL — ABNORMAL LOW (ref 3.87–5.11)
RDW: 15.4 % (ref 11.5–15.5)
WBC: 32.9 10*3/uL — ABNORMAL HIGH (ref 4.0–10.5)
nRBC: 0.7 % — ABNORMAL HIGH (ref 0.0–0.2)

## 2019-11-19 LAB — CULTURE, RESPIRATORY W GRAM STAIN: Culture: NORMAL

## 2019-11-19 LAB — D-DIMER, QUANTITATIVE: D-Dimer, Quant: >20 ug/mL-FEU — ABNORMAL HIGH (ref 0.00–0.50)

## 2019-11-19 LAB — GLUCOSE, CAPILLARY: Glucose-Capillary: 98 mg/dL (ref 70–99)

## 2019-11-19 LAB — LACTIC ACID, PLASMA: Lactic Acid, Venous: >11 mmol/L (ref 0.5–1.9)

## 2019-11-19 LAB — C-REACTIVE PROTEIN: CRP: 14.9 mg/dL — ABNORMAL HIGH (ref <1.0)

## 2019-11-19 MED ORDER — SODIUM CHLORIDE 0.9% FLUSH: 10.0000 mL | Freq: Two times a day (BID) | INTRAVENOUS | Status: DC

## 2019-11-19 MED ORDER — SODIUM CHLORIDE 0.9% FLUSH: 10.0000 mL | INTRAVENOUS | Status: DC | PRN

## 2019-11-19 MED ORDER — MORPHINE SULFATE (PF) 2 MG/ML IV SOLN
2.0000 mg | INTRAVENOUS | Status: DC | PRN
  Administered 2019-11-19: 2 mg via INTRAVENOUS
  Filled 2019-11-19: qty 1

## 2019-11-22 LAB — CULTURE, BLOOD (ROUTINE X 2): Culture: NO GROWTH

## 2019-12-09 NOTE — Progress Notes (Signed)
Notified [facility] of impending death  Referral # [id number]  Please call back with time of death

## 2019-12-09 NOTE — Death Summary Note (Signed)
DEATH SUMMARY   Patient Details  Name: Tanya [name] MRN: [id number] DOB: 1946-01-15  Admission/Discharge Information   Admit Date:  2019/12/13  Date of Death: Date of Death: 16-Dec-2019  Time of Death: Time of Death: 0422  Length of Stay: 3  Referring Physician: Zara Chess, NP   Reason(s) for Hospitalization  Acute on chronic hypoxic respiratory failure Severe sepsis with septic shock due due to bibasilar pneumonia (POA) ARDS COVID-19 pneumonia Acute diastolic congestive heart failure Lactic acidosis Acute kidney injury Morbid obesity OSA on CPAP Acute metabolic/toxic encephalopathy Atrial fibrillation with rapid ventricular response Septic arthritis, diagnosed as an outpatient on antibiotic therapy  Diagnoses  Preliminary cause of death:  Secondary Diagnoses (including complications and co-morbidities):  Active Problems:   COVID-19 virus infection   HCAP (healthcare-associated pneumonia)   Pneumonia   Pressure injury of skin   Brief Hospital Course (including significant findings, care, treatment, and services provided and events leading to death)  Tanya [name] is a 74 y.o. year old female admitted with suspected HCAP following recent hospitalization for COVID-19 PNA, now with worsening hypoxia. Became hypotensive in ED and was started on pressors. Patient condition got worse overnight, started requiring high flow oxygen which she could not tolerate and then she was put on BiPAP, she was tiring up and intubated.  Even after intubation patient remained hypoxic into 60s, x-ray chest was repeated after pulling back chest tube but her oxygen saturation did not improve.  She was moved to ICU, where she was settled and was started on paralytic therapy with that patient oxygen saturation is started slowly improving up to 90 but overnight patient became hypotensive, requiring multiple pressors and ABG showed severe respiratory acidosis and she died on 12-16-2019 at 4:22  AM.  Pertinent Labs and Studies  Significant Diagnostic Studies DG Chest 2 View  Result Date: 2019/12/13 CLINICAL DATA:  Shortness of breath, history of COVID EXAM: CHEST - 2 VIEW COMPARISON:  06/07/2019 FINDINGS: Frontal and lateral views of the chest demonstrate an unremarkable cardiac silhouette. Stable postsurgical changes from aortic valve replacement. Persistent interstitial prominence throughout the lungs, with minimal residual bilateral ground-glass airspace disease. Overall significant improvement since prior exam. No effusion or pneumothorax. IMPRESSION: 1. Marked improvement in the interstitial and ground-glass opacity seen previously, consistent with improving pneumonia. Due to Electronically Signed   By: Randa Ngo M.D.   On: 13-Dec-2019 18:16   DG CHEST PORT 1 VIEW  Result Date: 11/18/2019 CLINICAL DATA:  Central line placement EXAM: PORTABLE CHEST 1 VIEW COMPARISON:  Radiograph earlier today. FINDINGS: Right internal jugular central line tip in the mid SVC. No pneumothorax. The endotracheal tube tip is now 2.6 cm from the carina. Enteric tube in place with tip below the diaphragm not included in the field of view. Stable heterogeneous bilateral airspace opacities from prior exam. Stable mild cardiomegaly, post TAVR. No evidence of pneumomediastinum. No large pleural effusion. Stable osseous structures. IMPRESSION: 1. Right internal jugular central line tip in the mid SVC. No pneumothorax. 2. Endotracheal tube tip now 2.6 cm from the carina. Enteric tube in place. 3. Stable bilateral COVID pneumonia from prior exam. Electronically Signed   By: Keith Rake M.D.   On: 11/18/2019 16:48   DG Chest Portable 1 View  Addendum Date: 11/18/2019   ADDENDUM REPORT: 11/18/2019 13:28 ADDENDUM: Study discussed by telephone with Dr. Jacky Kindle on 11/18/2019 at 1317 hours. Electronically Signed   By: Genevie Ann M.D.   On: 11/18/2019 13:28   Result  Date: 11/18/2019 CLINICAL DATA:  74 year old  female with shortness of breath. Positive COVID-19. Intubated. EXAM: PORTABLE CHEST 1 VIEW COMPARISON:  Portable chest 0847 hours today and earlier. FINDINGS: Portable AP upright view at 1252 hours. Intubated. Endotracheal tube tip is at the right mainstem bronchus. This should be pulled back 2-3 cm. Enteric tube placed and courses to the left upper quadrant, tip not included. Stable lung volumes with extensive bilateral Patchy and confluent pulmonary opacity not significantly changed. Stable cardiac size and mediastinal contours. Prior TAVR. No pneumothorax or pleural effusion is evident. Stable visualized osseous structures. IMPRESSION: 1. Right mainstem bronchus intubation. Pull ETT back 2-3 cm and recommend repeat portable chest. 2. Enteric tube courses to the abdomen. 3. Extensive bilateral pneumonia appears stable from this morning. Electronically Signed: By: Genevie Ann M.D. On: 11/18/2019 13:08   DG Chest Port 1 View  Result Date: 11/07/2019 CLINICAL DATA:  Sudden onset respiratory distress, COVID-19 EXAM: PORTABLE CHEST 1 VIEW COMPARISON:  Radiograph 06/29/2018, CT 07/22/2019 FINDINGS: Widespread heterogeneous airspace opacities are present in both lungs with a basilar predominance as well as cephalized indistinct pulmonary vascularity. Small left effusion. No right effusion. No visible pneumothorax. Cardiomegaly, similar to priors. Evidence of prior TAVR. The aorta is calcified. The remaining cardiomediastinal contours are unremarkable. No acute osseous or soft tissue abnormality. Prior reverse left shoulder arthroplasty. Telemetry leads overlie the chest. IMPRESSION: Widespread heterogeneous airspace opacities in both lungs with a basilar predominance as well as cephalized pulmonary vascularity, could reflect pulmonary edema and/or multifocal pneumonia in the setting of COVID-19. Aortic Atherosclerosis (ICD10-I70.0). Electronically Signed   By: Lovena Le M.D.   On: 11/07/2019 19:34   DG Chest Port 1V  same Day  Result Date: 11/18/2019 CLINICAL DATA:  Shortness of breath EXAM: PORTABLE CHEST 1 VIEW COMPARISON:  November 16, 2019 FINDINGS: There is multifocal airspace opacity bilaterally, representing a significant change from 2 days prior. Heart is upper normal in size with pulmonary vascularity normal. No adenopathy. Patient is status post aortic valve replacement. No adenopathy appreciable by radiography. Total shoulder replacement noted on the left. IMPRESSION: Widespread airspace opacity bilaterally, a significant change from 2 days prior. Suspect multifocal atypical organism pneumonia. Stable cardiac silhouette. Status post aortic valve replacement. Electronically Signed   By: Lowella Grip III M.D.   On: 11/18/2019 09:06    Microbiology No results found for this or any previous visit (from the past 240 hour(s)).  Lab Basic Metabolic Panel: No results for input(s): NA, K, CL, CO2, GLUCOSE, BUN, CREATININE, CALCIUM, MG, PHOS in the last 168 hours. Liver Function Tests: No results for input(s): AST, ALT, ALKPHOS, BILITOT, PROT, ALBUMIN in the last 168 hours. No results for input(s): LIPASE, AMYLASE in the last 168 hours. No results for input(s): AMMONIA in the last 168 hours. CBC: No results for input(s): WBC, NEUTROABS, HGB, HCT, MCV, PLT in the last 168 hours. Cardiac Enzymes: No results for input(s): CKTOTAL, CKMB, CKMBINDEX, TROPONINI in the last 168 hours. Sepsis Labs: No results for input(s): PROCALCITON, WBC, LATICACIDVEN in the last 168 hours.  Procedures/Operations  Endotracheal intubation Central line Arterial line   Fate Galanti 11/28/2019, 2:45 PM

## 2019-12-09 NOTE — Progress Notes (Signed)
Chaplain responded to page to patient's bedside per request of patient's daughter to have Chaplain come to have prayers for her mother.  Chaplain and attending nurse stood at bedside with daughter who was grieving the imminent death of her mother.  Chaplain and nurse offered a ministry of presence with daughter and her mother as daughter spoke of how good a mother she had been, how blessed she has been to have her as a mother, how she has been her mom's sole caretaker through many recent illnesses, how it's been just the daughter and mother for a long time and how she will miss her mother.  Daughter described her mother as a God-fearing person who believed in salvation. Though grieving, daughter was comforted to know her mother would soon be reunited in [place] with those who have gone before. Daughter asked for prayer for her mother.  Chaplain offered a reading of Psalm 23.  Chaplain, nurse and daughter prayed at patient's bedside.  Chaplain offered to stay until the two granddaughters arrived but was assured the daughter was ok to being aone her with her mother until they arrived.   Chaplain advised daughter she would be present throughout the night if needed.  [place]

## 2019-12-09 NOTE — Progress Notes (Signed)
Dr Lucile Shutters spoke with family re ominous signs.   Family has now arrived and requesting a comfort approach. Placed order for PRN morphine per verbal discussion with Dr Lucile Shutters. Plan to stop medications and allow Tanya [name] to pass. We will have to leave ventilator in place d/t +Covid status.

## 2019-12-09 NOTE — Progress Notes (Signed)
CDS notified of time of death 04:22  Not donor candidate. Ok to release.

## 2019-12-09 DEATH — deceased

## 2020-01-19 ENCOUNTER — Ambulatory Visit: Payer: Medicare Other | Admitting: Cardiovascular Disease

## 2021-01-15 IMAGING — DX DG CHEST 1V PORT
1 series · 1 of 1 positions shown · non-contrast
Comparison: Radiograph 06/29/2018, CT 07/22/2019

CLINICAL DATA: Sudden onset respiratory distress, RDCIO-XZ

EXAM:
PORTABLE CHEST 1 VIEW

[chest ap]
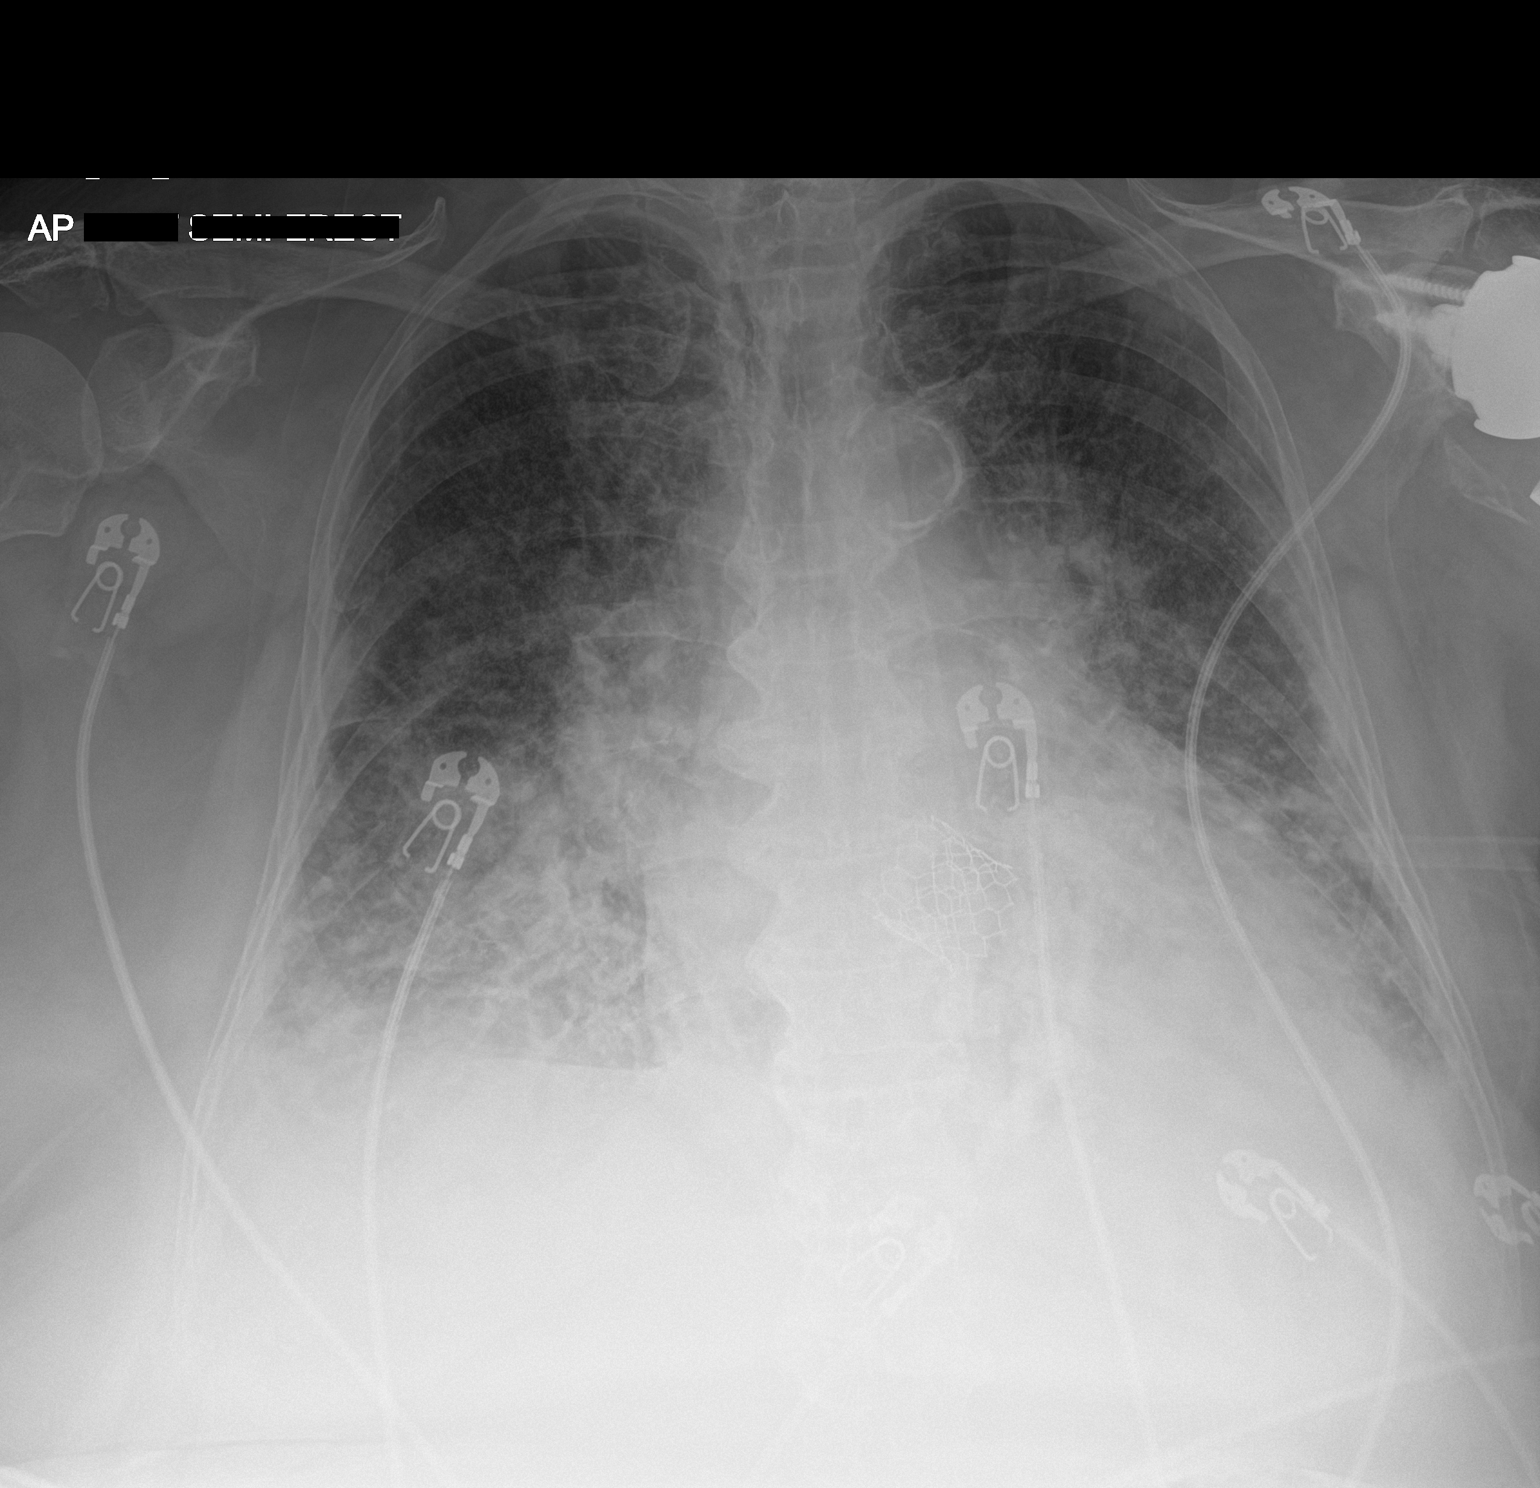

[1 of 1 positions shown; findings below may reference images not displayed]

FINDINGS: Widespread heterogeneous airspace opacities are present in both
lungs with a basilar predominance as well as cephalized indistinct
pulmonary vascularity. Small left effusion. No right effusion. No
visible pneumothorax. Cardiomegaly, similar to priors. Evidence of
prior TAVR. The aorta is calcified. The remaining cardiomediastinal
contours are unremarkable. No acute osseous or soft tissue
abnormality. Prior reverse left shoulder arthroplasty. Telemetry
leads overlie the chest.
IMPRESSION: Widespread heterogeneous airspace opacities in both lungs with a
basilar predominance as well as cephalized pulmonary vascularity,
could reflect pulmonary edema and/or multifocal pneumonia in the
setting of RDCIO-XZ.

Aortic Atherosclerosis ([id number].J).

## 2021-01-26 IMAGING — DX DG CHEST 1V PORT SAME DAY
1 series · 1 of 1 positions shown · non-contrast
Comparison: November 16, 2019

CLINICAL DATA: Shortness of breath

EXAM:
PORTABLE CHEST 1 VIEW

[chest]
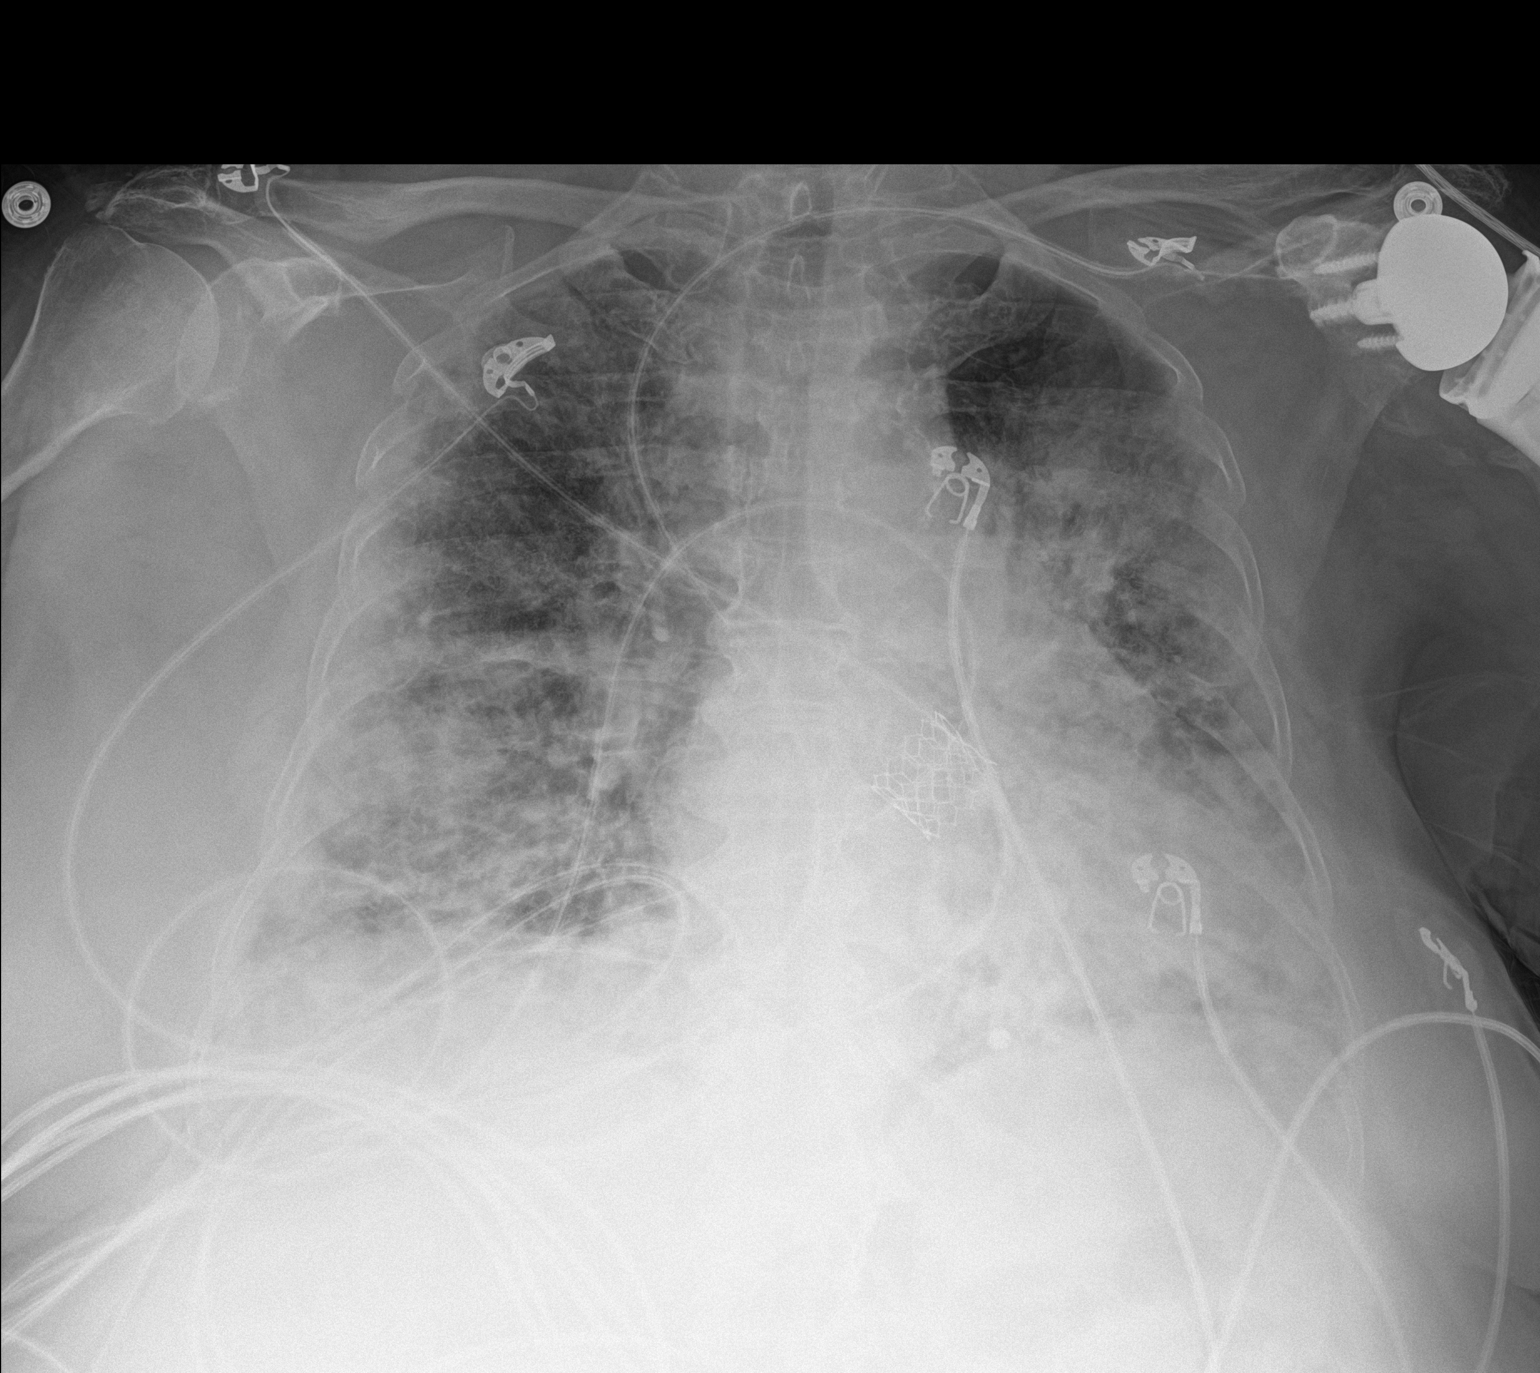

[1 of 1 positions shown; findings below may reference images not displayed]

FINDINGS: There is multifocal airspace opacity bilaterally, representing a
significant change from 2 days prior. Heart is upper normal in size
with pulmonary vascularity normal. No adenopathy. Patient is status
post aortic valve replacement. No adenopathy appreciable by
radiography. Total shoulder replacement noted on the left.
IMPRESSION: Widespread airspace opacity bilaterally, a significant change from 2
days prior. Suspect multifocal atypical organism pneumonia. Stable
cardiac silhouette. Status post aortic valve replacement.

## 2021-01-26 IMAGING — DX DG CHEST 1V PORT
1 series · 1 of 1 positions shown · non-contrast
Comparison: Portable chest 5043 hours today and earlier.
COMPARISON: Portable chest 5043 hours today and earlier.

Addendum:
CLINICAL DATA: 74-year-old female with shortness of breath.
Positive [id number]. Intubated.

EXAM:
PORTABLE CHEST 1 VIEW

[chest ap]
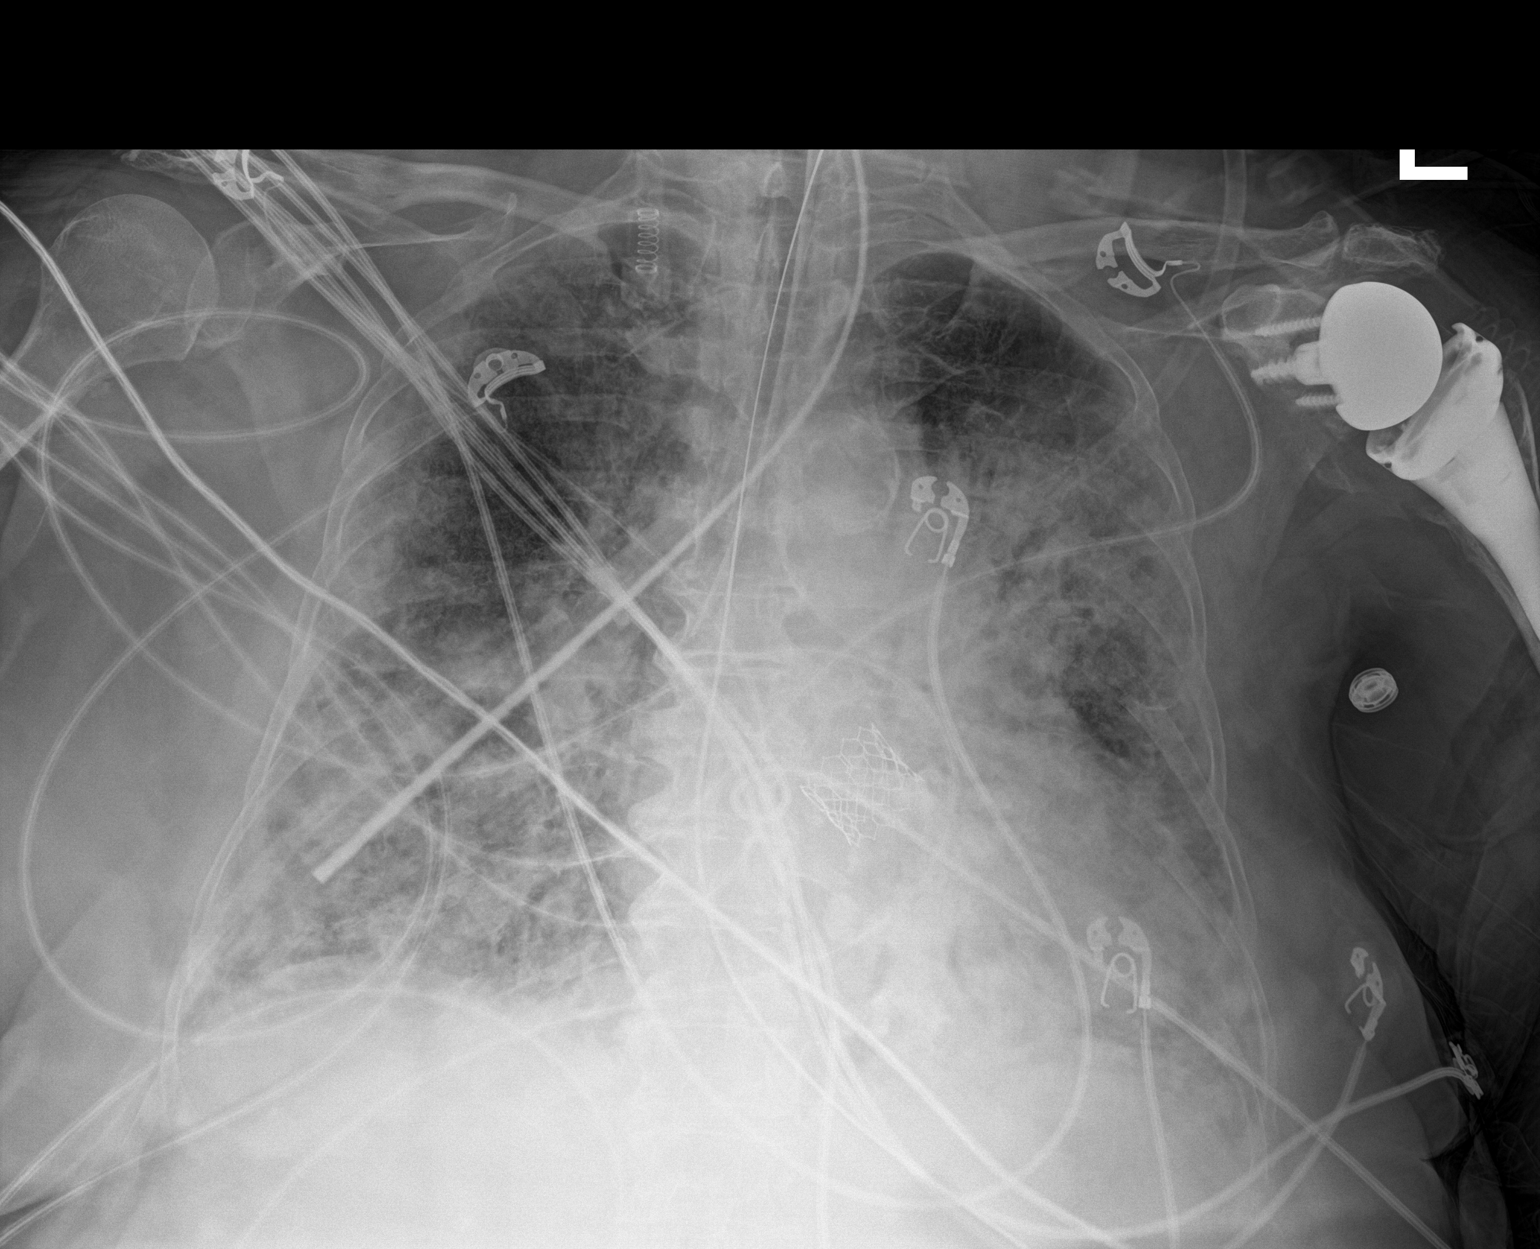

[1 of 1 positions shown; findings below may reference images not displayed]

FINDINGS: Portable AP upright view at 4818 hours. Intubated. Endotracheal tube
tip is at the right mainstem bronchus. This should be pulled back
2-3 cm.

Enteric tube placed and courses to the left upper quadrant, tip not
included.

Stable lung volumes with extensive bilateral Patchy and confluent
pulmonary opacity not significantly changed. Stable cardiac size and
mediastinal contours. Prior TAVR. No pneumothorax or pleural
effusion is evident. Stable visualized osseous structures.
IMPRESSION: 1. Right mainstem bronchus intubation. Pull ETT back 2-3 cm and
recommend repeat portable chest.
2. Enteric tube courses to the abdomen.
3. Extensive bilateral pneumonia appears stable from this morning.

ADDENDUM:
Study discussed by telephone with Dr. PAULO TERESA CARDODO on 11/18/2019 at
6563 hours.

*** End of Addendum ***
FINDINGS: Portable AP upright view at 4818 hours. Intubated. Endotracheal tube
tip is at the right mainstem bronchus. This should be pulled back
2-3 cm.

Enteric tube placed and courses to the left upper quadrant, tip not
included.

Stable lung volumes with extensive bilateral Patchy and confluent
pulmonary opacity not significantly changed. Stable cardiac size and
mediastinal contours. Prior TAVR. No pneumothorax or pleural
effusion is evident. Stable visualized osseous structures.
IMPRESSION: 1. Right mainstem bronchus intubation. Pull ETT back 2-3 cm and
recommend repeat portable chest.
2. Enteric tube courses to the abdomen.
3. Extensive bilateral pneumonia appears stable from this morning.
